# Patient Record
Sex: Female | Born: 1940
Health system: Southern US, Community
[De-identification: ages and names within clinical notes are randomized; demographics above are authoritative.]

## PROBLEM LIST (undated history)

## (undated) DIAGNOSIS — F329 Major depressive disorder, single episode, unspecified: Secondary | ICD-10-CM

## (undated) DIAGNOSIS — F419 Anxiety disorder, unspecified: Secondary | ICD-10-CM

## (undated) DIAGNOSIS — C4490 Unspecified malignant neoplasm of skin, unspecified: Secondary | ICD-10-CM

## (undated) DIAGNOSIS — H269 Unspecified cataract: Secondary | ICD-10-CM

## (undated) DIAGNOSIS — J329 Chronic sinusitis, unspecified: Secondary | ICD-10-CM

## (undated) DIAGNOSIS — R112 Nausea with vomiting, unspecified: Secondary | ICD-10-CM

## (undated) DIAGNOSIS — H3530 Unspecified macular degeneration: Secondary | ICD-10-CM

## (undated) DIAGNOSIS — G8929 Other chronic pain: Secondary | ICD-10-CM

## (undated) DIAGNOSIS — M549 Dorsalgia, unspecified: Secondary | ICD-10-CM

## (undated) DIAGNOSIS — C801 Malignant (primary) neoplasm, unspecified: Secondary | ICD-10-CM

## (undated) DIAGNOSIS — Z9889 Other specified postprocedural states: Secondary | ICD-10-CM

## (undated) DIAGNOSIS — R1901 Right upper quadrant abdominal swelling, mass and lump: Secondary | ICD-10-CM

## (undated) DIAGNOSIS — T7840XA Allergy, unspecified, initial encounter: Secondary | ICD-10-CM

## (undated) DIAGNOSIS — K219 Gastro-esophageal reflux disease without esophagitis: Secondary | ICD-10-CM

## (undated) DIAGNOSIS — N289 Disorder of kidney and ureter, unspecified: Secondary | ICD-10-CM

## (undated) DIAGNOSIS — H409 Unspecified glaucoma: Secondary | ICD-10-CM

## (undated) DIAGNOSIS — E039 Hypothyroidism, unspecified: Secondary | ICD-10-CM

## (undated) DIAGNOSIS — N189 Chronic kidney disease, unspecified: Secondary | ICD-10-CM

## (undated) DIAGNOSIS — I509 Heart failure, unspecified: Secondary | ICD-10-CM

## (undated) DIAGNOSIS — M1990 Unspecified osteoarthritis, unspecified site: Secondary | ICD-10-CM

## (undated) DIAGNOSIS — M797 Fibromyalgia: Secondary | ICD-10-CM

## (undated) DIAGNOSIS — F32A Depression, unspecified: Secondary | ICD-10-CM

## (undated) HISTORY — DX: Chronic sinusitis, unspecified: J32.9

## (undated) HISTORY — DX: Right upper quadrant abdominal swelling, mass and lump: R19.01

## (undated) HISTORY — PX: SMALL INTESTINE SURGERY: SHX150

## (undated) HISTORY — PX: EYE SURGERY: SHX253

## (undated) HISTORY — PX: ABDOMINAL HYSTERECTOMY: SUR658

## (undated) HISTORY — PX: APPENDECTOMY: SHX54

## (undated) HISTORY — PX: COSMETIC SURGERY: SHX468

## (undated) HISTORY — PX: BACK SURGERY: SHX140

## (undated) HISTORY — DX: Unspecified cataract: H26.9

## (undated) HISTORY — DX: Unspecified malignant neoplasm of skin, unspecified: C44.90

## (undated) HISTORY — PX: ABDOMINAL HYSTERECTOMY: SHX81

## (undated) HISTORY — DX: Unspecified osteoarthritis, unspecified site: M19.90

## (undated) HISTORY — PX: OTHER SURGICAL HISTORY: SHX169

## (undated) HISTORY — DX: Heart failure, unspecified: I50.9

## (undated) HISTORY — DX: Allergy, unspecified, initial encounter: T78.40XA

## (undated) HISTORY — PX: TONSILLECTOMY: SUR1361

## (undated) HISTORY — PX: CHOLECYSTECTOMY: SHX55

## (undated) HISTORY — DX: Chronic kidney disease, unspecified: N18.9

---

## 2003-07-16 ENCOUNTER — Ambulatory Visit (HOSPITAL_COMMUNITY): Admission: RE | Admit: 2003-07-16 | Discharge: 2003-07-16 | Payer: Self-pay | Admitting: Family Medicine

## 2003-07-16 ENCOUNTER — Encounter: Payer: Self-pay | Admitting: Family Medicine

## 2003-07-20 ENCOUNTER — Ambulatory Visit (HOSPITAL_COMMUNITY): Admission: RE | Admit: 2003-07-20 | Discharge: 2003-07-20 | Payer: Self-pay | Admitting: Family Medicine

## 2003-07-20 ENCOUNTER — Encounter: Payer: Self-pay | Admitting: Family Medicine

## 2006-12-31 ENCOUNTER — Ambulatory Visit (HOSPITAL_COMMUNITY): Admission: RE | Admit: 2006-12-31 | Discharge: 2006-12-31 | Payer: Self-pay | Admitting: Family Medicine

## 2008-01-02 ENCOUNTER — Ambulatory Visit (HOSPITAL_COMMUNITY): Admission: RE | Admit: 2008-01-02 | Discharge: 2008-01-02 | Payer: Self-pay | Admitting: Family Medicine

## 2008-11-02 ENCOUNTER — Encounter: Admission: RE | Admit: 2008-11-02 | Discharge: 2008-11-02 | Payer: Self-pay | Admitting: Neurosurgery

## 2008-12-17 ENCOUNTER — Inpatient Hospital Stay (HOSPITAL_COMMUNITY): Admission: RE | Admit: 2008-12-17 | Discharge: 2008-12-20 | Payer: Self-pay | Admitting: Neurosurgery

## 2009-05-03 ENCOUNTER — Ambulatory Visit (HOSPITAL_COMMUNITY): Admission: RE | Admit: 2009-05-03 | Discharge: 2009-05-03 | Payer: Self-pay | Admitting: Family Medicine

## 2009-05-06 ENCOUNTER — Encounter (HOSPITAL_COMMUNITY): Admission: RE | Admit: 2009-05-06 | Discharge: 2009-06-05 | Payer: Self-pay | Admitting: Family Medicine

## 2009-06-17 ENCOUNTER — Encounter (INDEPENDENT_AMBULATORY_CARE_PROVIDER_SITE_OTHER): Payer: Self-pay | Admitting: General Surgery

## 2009-06-17 ENCOUNTER — Ambulatory Visit (HOSPITAL_COMMUNITY): Admission: RE | Admit: 2009-06-17 | Discharge: 2009-06-17 | Payer: Self-pay | Admitting: General Surgery

## 2011-02-02 ENCOUNTER — Other Ambulatory Visit (HOSPITAL_COMMUNITY): Payer: Self-pay | Admitting: Family Medicine

## 2011-02-06 ENCOUNTER — Ambulatory Visit (HOSPITAL_COMMUNITY)
Admission: RE | Admit: 2011-02-06 | Discharge: 2011-02-06 | Disposition: A | Discharge status: Home / Self Care | Payer: Medicare Other | Source: Ambulatory Visit | Attending: Family Medicine | Admitting: Family Medicine

## 2011-02-06 ENCOUNTER — Other Ambulatory Visit (HOSPITAL_COMMUNITY): Payer: Self-pay | Admitting: Family Medicine

## 2011-02-06 DIAGNOSIS — R51 Headache: Secondary | ICD-10-CM | POA: Insufficient documentation

## 2011-02-06 DIAGNOSIS — J329 Chronic sinusitis, unspecified: Secondary | ICD-10-CM | POA: Insufficient documentation

## 2011-03-04 LAB — CBC
Hemoglobin: 12.1 g/dL (ref 12.0–15.0)
MCHC: 34.4 g/dL (ref 30.0–36.0)
RBC: 3.65 MIL/uL — ABNORMAL LOW (ref 3.87–5.11)
WBC: 5.6 10*3/uL (ref 4.0–10.5)

## 2011-03-04 LAB — COMPREHENSIVE METABOLIC PANEL
ALT: 14 U/L (ref 0–35)
AST: 20 U/L (ref 0–37)
Alkaline Phosphatase: 72 U/L (ref 39–117)
CO2: 29 mEq/L (ref 19–32)
Chloride: 103 mEq/L (ref 96–112)
GFR calc Af Amer: 52 mL/min — ABNORMAL LOW (ref >60)
GFR calc non Af Amer: 43 mL/min — ABNORMAL LOW (ref >60)
Glucose, Bld: 117 mg/dL — ABNORMAL HIGH (ref 70–99)
Potassium: 4.4 mEq/L (ref 3.5–5.1)
Sodium: 138 mEq/L (ref 135–145)
Total Bilirubin: 0.5 mg/dL (ref 0.3–1.2)

## 2011-03-04 LAB — DIFFERENTIAL
Basophils Relative: 0 % (ref 0–1)
Eosinophils Absolute: 0.1 10*3/uL (ref 0.0–0.7)
Eosinophils Relative: 2 % (ref 0–5)
Neutrophils Relative %: 71 % (ref 43–77)

## 2011-03-12 LAB — BASIC METABOLIC PANEL
CO2: 29 mEq/L (ref 19–32)
Calcium: 9.2 mg/dL (ref 8.4–10.5)
Chloride: 103 mEq/L (ref 96–112)
Creatinine, Ser: 1.19 mg/dL (ref 0.4–1.2)
GFR calc Af Amer: 55 mL/min — ABNORMAL LOW (ref >60)
Glucose, Bld: 103 mg/dL — ABNORMAL HIGH (ref 70–99)

## 2011-03-12 LAB — TYPE AND SCREEN
ABO/RH(D): O POS
Antibody Screen: NEGATIVE

## 2011-03-12 LAB — CBC
Hemoglobin: 12.1 g/dL (ref 12.0–15.0)
MCHC: 33.2 g/dL (ref 30.0–36.0)
MCV: 98.9 fL (ref 78.0–100.0)
RBC: 3.69 MIL/uL — ABNORMAL LOW (ref 3.87–5.11)
RDW: 13.2 % (ref 11.5–15.5)

## 2011-04-10 NOTE — Op Note (Signed)
NAMESATYA, BOHALL                ACCOUNT NO.:  [id number]   MEDICAL RECORD NO.:  [id number]          PATIENT TYPE:  AMB   LOCATION:  SDS                          FACILITY:  [facility]   PHYSICIAN:  Ollen Gross. Vernell Morgans, M.D. DATE OF BIRTH:  1941-01-08   DATE OF PROCEDURE:  06/17/2009  DATE OF DISCHARGE:  06/17/2009                               OPERATIVE REPORT   PREOPERATIVE DIAGNOSIS:  Biliary dyskinesia.   POSTOPERATIVE DIAGNOSIS:  Biliary dyskinesia.   PROCEDURE:  Laparoscopic cholecystectomy with intraoperative  cholangiogram.   SURGEON:  Ollen Gross. Vernell Morgans, MD   ANESTHESIA:  General endotracheal.   PROCEDURE:  After informed consent was obtained, the patient was brought  to the operating room, placed in the supine position on the operating  room table.  After adequate induction of general anesthesia, the  patient's abdomen was prepped with Betadine, draped in the usual sterile  manner.  The area below the umbilicus was infiltrated with 0.25%  Marcaine.  A small incision was made with a 15-blade knife.  This  incision was carried down through the subcutaneous tissue bluntly with a  hemostat and [employer] retractors until the linea alba was identified.  The linea alba was incised with a 15-blade knife and each side was  grasped Kocher clamps and elevated anteriorly.  The preperitoneal space  was then probed bluntly with a hemostat until the peritoneum was opened  and access was gained to the abdominal cavity.  A 0 Vicryl pursestring  stitch was placed in the fascia around the opening.  A Hasson cannula  was placed through the opening and anchored in place with the previously  placed Vicryl pursestring stitch.  The abdomen was then insufflated with  carbon dioxide without difficulty.  The patient was placed in reverse  Trendelenburg position and rotated with the right side up.  A  laparoscope was inserted through the Hasson cannula, and the right upper  quadrant was inspected.  The  dome of gallbladder and liver readily  identified.  Next, the epigastric region was infiltrated with 0.25%  Marcaine.  A small incision was made with a 15-blade knife.  A 10-mm  port was placed bluntly through this incision into the abdominal cavity  under direct vision.  Sites were chosen laterally on the right side  abdomen, placement of 5-mm port.  Each of these areas were infiltrated  with 0.25% Marcaine.  Small stab incisions were made with a 15-blade  knife and 5-mm ports were placed bluntly through these incisions into  the abdominal cavity under direct vision.  Blunt grasper was then placed  through the lateral-most 5-mm port and used to grasp the dome of the  gallbladder and elevate anteriorly and superiorly.  Another blunt  grasper was placed in the other 5-mm port and used to grasp the body and  used to retract on the body and neck of the gallbladder.  A dissector  was placed through the epigastric port, and using electrocautery the  peritoneal reflection at the gallbladder neck was opened.  Blunt  dissection was then carried out in this area  until the gallbladder neck-  cystic duct junction was readily identified and a good window was  created.  A single clip was placed on the gallbladder neck.  A small  ductotomy was made just below the clip with a laparoscopic scissors.  A  14-gauge Angiocath was placed percutaneously through the anterior  abdominal wall under direct vision.  A Reddick cholangiogram catheter  was placed through the catheter and flushed.  The Reddick catheter was  then placed through the cystic duct and anchored in place with a clip.  A cholangiogram was obtained that showed no filling defects, good  emptying in the duodenum, and adequate length on the cystic duct.  Anchoring clip and catheters were removed from the patient.  Three clips  were placed proximally on the cystic duct and duct was divided between  the 2 sets of clips.  Posterior to this, the cystic  artery was  identified and again dissected bluntly in a circumferential manner until  a good window was created.  Two clips were placed proximally and one  distally on the artery and artery was divided between the two.  Next, a  laparoscopic hook cautery device was used to separate the gallbladder  from the liver bed.  Prior to completely detaching the gallbladder from  the liver bed, the liver bed was inspected and found to be hemostatic.  The gallbladder was then detached and arrest away from liver bed without  difficulty with the hook cautery.  A laparoscopic bag was inserted  through the epigastric port.  Gallbladder was placed in the bag and bag  was sealed.  The abdomen was then irrigated copious amounts of saline  until the effluent was clear.  The laparoscope was then removed from the  epigastric port.  A gallbladder grasper was placed through the Hasson  cannula and used to grasp the opening of the bag.  The bag with the  gallbladder was removed through the infraumbilical port without  difficulty.  The fascial defect was closed with a Vicryl pursestring  stitch as well as with another figure-of-eight 0 Vicryl stitch.  The  rest of the ports were then removed under direct vision and were found  to be hemostatic.  The gas was allowed to escape.  Skin incisions were  all closed with interrupted 4-0 Monocryl subcuticular stitches.  Dermabond dressings were applied.  The patient tolerated the procedure  well.  At the end of the case, all needle, sponge, instrument counts  were correct.  The patient was awakened and taken to recovery in stable  condition.      Ollen Gross. Vernell Morgans, M.D.  Electronically Signed     PST/MEDQ  D:  06/17/2009  T:  06/18/2009  Job:  [id number]

## 2011-04-10 NOTE — Op Note (Signed)
[name], Yang                ACCOUNT NO.:  [id number]   MEDICAL RECORD NO.:  [id number]          PATIENT TYPE:  INP   LOCATION:  3012                         FACILITY:  [facility]   PHYSICIAN:  Danae Orleans. Venetia Maxon, M.D.  DATE OF BIRTH:  20-Nov-1941   DATE OF PROCEDURE:  12/17/2008  DATE OF DISCHARGE:                               OPERATIVE REPORT   PREOPERATIVE DIAGNOSIS:  L2-3 scoliosis, spondylosis, degenerative disk  disease, and radiculopathy.   POSTOPERATIVE DIAGNOSIS:  L2-3 scoliosis, spondylosis, degenerative disk  disease, and radiculopathy.   PROCEDURE:  L2-3 anterolateral decompression and fusion with anterior  lumbar interbody cage and lateral spinal plate with EMG monitoring.   SURGEON:  Danae Orleans. Venetia Maxon, MD   ASSISTANT:  1. Georgiann Cocker, RN  2. Hewitt Shorts, MD.   ANESTHESIA:  General endotracheal anesthesia.   ESTIMATED BLOOD LOSS:  Minimal.   COMPLICATIONS:  None.   DISPOSITION:  Recovery.   INDICATIONS:  [name] is a 70 year old woman with excruciating back  and left leg pain.  She has focal scoliosis at L2-3 with severe left-  sided nerve root entrapment affecting the L3 nerve root.  She had  injection therapy which gave her unsustained relief, but did help  relieve her pain with L3 nerve block.  It was therefore elected to take  her to surgery for anterolateral fusion with interbody cage and lateral  plate.   PROCEDURE:  [name] was brought to the operating room.  After smooth  and uncomplicated induction of general endotracheal anesthesia, the  patient was placed in the lateral position on the left side down.  Positioning her spine was confirmed on AP and lateral fluoroscopy, and  her iliac crest and chest were then taped along with her legs which were  flexed.  The table was then flexed while monitoring positioning within  the interspace at the L2-3 level.  A planned incision was then marked  and then wound was prepped and draped in the  usual sterile fashion.  Approximately 3-cm long lateral incision just directly over the L2-3  interspace was made and carried to the fascia.  A separate incision was  made 1 finger length away more posteriorly toward the spine on the line  with the previous incision.  The incision was carried to the fascia and  using Kelly with blunt dissection, the retroperitoneal space was entered  and dissection was made.  The spinal transverse process was palpated as  was the psoas.  Then, using finger, the inferior aspect of the incision  was palpated and incision was carried to meet the finger with the upper  incision.  Using sequential dilators and the neuronavigation, the  initial dilator was placed.  This was positioned overlying the  interspace just slightly posterior of the midline and a K-wire was  inserted within the disk.  Sequential dilation was performed all the  time with neural monitoring and there was no evidence of any nerve root  irritability.  There was no evidence of any nerve root contact at all  and the patient had a training for stimulation  test.  Subsequently, the  Maxis retractor was placed and its positioning again was confirmed on AP  and lateral fluoroscopy. After the shim was placed, the psoas muscle was  cleared and the interspace was incised.  A thorough diskectomy was  performed using Cobb dissectors and a variety of pituitary rongeurs and  a distraction device to open the interspace.  Positioning of these  devices was come confirmed on AP and lateral fluoroscopy.  The Cobb  dissector was taken through the annulus on the opposite side and after  trial sizing an 8-mm x 50-mm cage which was packed with Osteocel was  then inserted in the interspace and countersunk appropriately.  Subsequently, a lateral plate was then placed with a 50-mm x 5.5-mm  screw at L3 and a 45 x 5.5 mm screw at L2.  After using the initial  sizer and then subsequently a plate was placed and locking  caps were  torqued into position.  Final radiographs demonstrated well-positioned  interbody graft and screws and plate.  Wound was irrigated and self-  retaining tractors were removed.  The fascia was closed with 2-0 Vicryl  sutures.  The skin edges were approximated with 3-0 Vicryl interrupted  inverted sutures and the skin was dressed with Dermabond.  The table was  deflexed.  The patient was transferred to the OR gurney, extubated in  the operating room and taken to the recovery in stable satisfactory  condition having tolerated operation well.  Counts were correct at the  end of the case.      Danae Orleans. Venetia Maxon, M.D.  Electronically Signed     JDS/MEDQ  D:  12/17/2008  T:  12/18/2008  Job:  [id number]

## 2011-04-13 NOTE — Discharge Summary (Signed)
NAMECHANTILLY, [name]                ACCOUNT NO.:  [id number]   MEDICAL RECORD NO.:  [id number]          PATIENT TYPE:  INP   LOCATION:  3012                         FACILITY:  [facility]   PHYSICIAN:  Danae Orleans. Venetia Maxon, M.D.  DATE OF BIRTH:  1941-11-16   DATE OF ADMISSION:  12/17/2008  DATE OF DISCHARGE:  12/20/2008                               DISCHARGE SUMMARY   REASON FOR ADMISSION:  1. Idiopathic scoliosis.  2. Lumbosacral spondylosis.  3. Disk degeneration.  4. Myalgia and myositis.  5. Esophageal reflux.  6. Depressive disorder, no essential change.   FINAL DIAGNOSES:  1. Idiopathic scoliosis.  2. Lumbosacral spondylosis.  3. Disk degeneration.  4. Myalgia and myositis.  5. Esophageal reflux.  6. Depressive disorder, no essential change.   HISTORY OF PRESENT ILLNESS AND HOSPITAL COURSE:  [name] is a 70-  year-old woman with significant scoliosis centered at the L2-3 level  with degenerative disk disease and lumbar radiculopathy.  She underwent  anterolateral fusion, decompression and fusion at L2-3 with restoration  of her scoliosis and improvement in back and lower extremity pain.  She  was gradually mobilized, was doing well on December 20, 2008, and was  discharged home with instructions to follow up in the office in 3 weeks  with discharge medications of Percocet, Valium, Robaxin alternating with  Valium for muscle relaxer with the final diagnoses same and discharge  status improved.      Danae Orleans. Venetia Maxon, M.D.  Electronically Signed     Danae Orleans. Venetia Maxon, M.D.  Electronically Signed    JDS/MEDQ  D:  03/03/2009  T:  03/04/2009  Job:  [id number]

## 2011-11-30 DIAGNOSIS — H8110 Benign paroxysmal vertigo, unspecified ear: Secondary | ICD-10-CM | POA: Diagnosis not present

## 2011-11-30 DIAGNOSIS — H9190 Unspecified hearing loss, unspecified ear: Secondary | ICD-10-CM | POA: Diagnosis not present

## 2012-01-02 DIAGNOSIS — S335XXA Sprain of ligaments of lumbar spine, initial encounter: Secondary | ICD-10-CM | POA: Diagnosis not present

## 2012-01-29 DIAGNOSIS — IMO0002 Reserved for concepts with insufficient information to code with codable children: Secondary | ICD-10-CM | POA: Diagnosis not present

## 2012-02-14 DIAGNOSIS — L570 Actinic keratosis: Secondary | ICD-10-CM | POA: Diagnosis not present

## 2012-02-14 DIAGNOSIS — Z85828 Personal history of other malignant neoplasm of skin: Secondary | ICD-10-CM | POA: Diagnosis not present

## 2012-02-14 DIAGNOSIS — D235 Other benign neoplasm of skin of trunk: Secondary | ICD-10-CM | POA: Diagnosis not present

## 2012-07-31 DIAGNOSIS — Z23 Encounter for immunization: Secondary | ICD-10-CM | POA: Diagnosis not present

## 2012-08-11 DIAGNOSIS — IMO0002 Reserved for concepts with insufficient information to code with codable children: Secondary | ICD-10-CM | POA: Diagnosis not present

## 2012-08-11 DIAGNOSIS — R5381 Other malaise: Secondary | ICD-10-CM | POA: Diagnosis not present

## 2012-08-11 DIAGNOSIS — G4700 Insomnia, unspecified: Secondary | ICD-10-CM | POA: Diagnosis not present

## 2012-10-28 DIAGNOSIS — H2510 Age-related nuclear cataract, unspecified eye: Secondary | ICD-10-CM | POA: Diagnosis not present

## 2012-10-28 DIAGNOSIS — H524 Presbyopia: Secondary | ICD-10-CM | POA: Diagnosis not present

## 2012-10-28 DIAGNOSIS — H353190 Nonexudative age-related macular degeneration, unspecified eye, stage unspecified: Secondary | ICD-10-CM | POA: Diagnosis not present

## 2012-10-28 DIAGNOSIS — H35369 Drusen (degenerative) of macula, unspecified eye: Secondary | ICD-10-CM | POA: Diagnosis not present

## 2012-11-28 DIAGNOSIS — H35369 Drusen (degenerative) of macula, unspecified eye: Secondary | ICD-10-CM | POA: Diagnosis not present

## 2012-11-28 DIAGNOSIS — H353190 Nonexudative age-related macular degeneration, unspecified eye, stage unspecified: Secondary | ICD-10-CM | POA: Diagnosis not present

## 2013-01-15 DIAGNOSIS — J9801 Acute bronchospasm: Secondary | ICD-10-CM | POA: Diagnosis not present

## 2013-01-15 DIAGNOSIS — E039 Hypothyroidism, unspecified: Secondary | ICD-10-CM | POA: Diagnosis not present

## 2013-01-15 DIAGNOSIS — J209 Acute bronchitis, unspecified: Secondary | ICD-10-CM | POA: Diagnosis not present

## 2013-01-15 DIAGNOSIS — IMO0002 Reserved for concepts with insufficient information to code with codable children: Secondary | ICD-10-CM | POA: Diagnosis not present

## 2013-02-01 DIAGNOSIS — H5310 Unspecified subjective visual disturbances: Secondary | ICD-10-CM | POA: Diagnosis not present

## 2013-03-23 ENCOUNTER — Other Ambulatory Visit (HOSPITAL_COMMUNITY): Payer: Self-pay | Admitting: Family Medicine

## 2013-03-23 DIAGNOSIS — G4700 Insomnia, unspecified: Secondary | ICD-10-CM | POA: Diagnosis not present

## 2013-03-23 DIAGNOSIS — E039 Hypothyroidism, unspecified: Secondary | ICD-10-CM | POA: Diagnosis not present

## 2013-03-23 DIAGNOSIS — Z139 Encounter for screening, unspecified: Secondary | ICD-10-CM

## 2013-04-01 ENCOUNTER — Other Ambulatory Visit (HOSPITAL_COMMUNITY): Payer: 59

## 2013-05-04 DIAGNOSIS — H353190 Nonexudative age-related macular degeneration, unspecified eye, stage unspecified: Secondary | ICD-10-CM | POA: Diagnosis not present

## 2013-05-04 DIAGNOSIS — H2510 Age-related nuclear cataract, unspecified eye: Secondary | ICD-10-CM | POA: Diagnosis not present

## 2013-05-04 DIAGNOSIS — H35369 Drusen (degenerative) of macula, unspecified eye: Secondary | ICD-10-CM | POA: Diagnosis not present

## 2013-07-22 DIAGNOSIS — M1990 Unspecified osteoarthritis, unspecified site: Secondary | ICD-10-CM | POA: Diagnosis not present

## 2013-07-22 DIAGNOSIS — E039 Hypothyroidism, unspecified: Secondary | ICD-10-CM | POA: Diagnosis not present

## 2013-07-22 DIAGNOSIS — G4700 Insomnia, unspecified: Secondary | ICD-10-CM | POA: Diagnosis not present

## 2013-08-31 DIAGNOSIS — Z23 Encounter for immunization: Secondary | ICD-10-CM | POA: Diagnosis not present

## 2013-10-12 DIAGNOSIS — L819 Disorder of pigmentation, unspecified: Secondary | ICD-10-CM | POA: Diagnosis not present

## 2013-10-12 DIAGNOSIS — L570 Actinic keratosis: Secondary | ICD-10-CM | POA: Diagnosis not present

## 2013-10-12 DIAGNOSIS — Z85828 Personal history of other malignant neoplasm of skin: Secondary | ICD-10-CM | POA: Diagnosis not present

## 2013-10-12 DIAGNOSIS — D485 Neoplasm of uncertain behavior of skin: Secondary | ICD-10-CM | POA: Diagnosis not present

## 2013-10-12 DIAGNOSIS — C44319 Basal cell carcinoma of skin of other parts of face: Secondary | ICD-10-CM | POA: Diagnosis not present

## 2013-10-29 DIAGNOSIS — C44319 Basal cell carcinoma of skin of other parts of face: Secondary | ICD-10-CM | POA: Diagnosis not present

## 2013-10-29 DIAGNOSIS — L570 Actinic keratosis: Secondary | ICD-10-CM | POA: Diagnosis not present

## 2013-11-11 DIAGNOSIS — M545 Low back pain: Secondary | ICD-10-CM | POA: Diagnosis not present

## 2013-11-11 DIAGNOSIS — E039 Hypothyroidism, unspecified: Secondary | ICD-10-CM | POA: Diagnosis not present

## 2013-11-11 DIAGNOSIS — F411 Generalized anxiety disorder: Secondary | ICD-10-CM | POA: Diagnosis not present

## 2013-11-23 DIAGNOSIS — H353190 Nonexudative age-related macular degeneration, unspecified eye, stage unspecified: Secondary | ICD-10-CM | POA: Diagnosis not present

## 2013-11-23 DIAGNOSIS — H524 Presbyopia: Secondary | ICD-10-CM | POA: Diagnosis not present

## 2013-11-23 DIAGNOSIS — H2510 Age-related nuclear cataract, unspecified eye: Secondary | ICD-10-CM | POA: Diagnosis not present

## 2013-11-23 DIAGNOSIS — H52229 Regular astigmatism, unspecified eye: Secondary | ICD-10-CM | POA: Diagnosis not present

## 2013-12-07 DIAGNOSIS — L570 Actinic keratosis: Secondary | ICD-10-CM | POA: Diagnosis not present

## 2014-03-02 DIAGNOSIS — K2100 Gastro-esophageal reflux disease with esophagitis, without bleeding: Secondary | ICD-10-CM | POA: Diagnosis not present

## 2014-03-02 DIAGNOSIS — M545 Low back pain, unspecified: Secondary | ICD-10-CM | POA: Diagnosis not present

## 2014-03-02 DIAGNOSIS — E039 Hypothyroidism, unspecified: Secondary | ICD-10-CM | POA: Diagnosis not present

## 2014-03-02 DIAGNOSIS — F411 Generalized anxiety disorder: Secondary | ICD-10-CM | POA: Diagnosis not present

## 2014-03-11 DIAGNOSIS — R5381 Other malaise: Secondary | ICD-10-CM | POA: Diagnosis not present

## 2014-03-11 DIAGNOSIS — R5383 Other fatigue: Secondary | ICD-10-CM | POA: Diagnosis not present

## 2014-03-11 DIAGNOSIS — E039 Hypothyroidism, unspecified: Secondary | ICD-10-CM | POA: Diagnosis not present

## 2014-03-11 DIAGNOSIS — M545 Low back pain, unspecified: Secondary | ICD-10-CM | POA: Diagnosis not present

## 2014-03-11 DIAGNOSIS — K2100 Gastro-esophageal reflux disease with esophagitis, without bleeding: Secondary | ICD-10-CM | POA: Diagnosis not present

## 2014-03-22 ENCOUNTER — Encounter (INDEPENDENT_AMBULATORY_CARE_PROVIDER_SITE_OTHER): Payer: Self-pay

## 2014-03-22 ENCOUNTER — Encounter (INDEPENDENT_AMBULATORY_CARE_PROVIDER_SITE_OTHER): Payer: Self-pay | Admitting: *Deleted

## 2014-05-05 DIAGNOSIS — Z79899 Other long term (current) drug therapy: Secondary | ICD-10-CM | POA: Diagnosis not present

## 2014-05-05 DIAGNOSIS — M545 Low back pain, unspecified: Secondary | ICD-10-CM | POA: Diagnosis not present

## 2014-05-05 DIAGNOSIS — K2100 Gastro-esophageal reflux disease with esophagitis, without bleeding: Secondary | ICD-10-CM | POA: Diagnosis not present

## 2014-05-05 DIAGNOSIS — E039 Hypothyroidism, unspecified: Secondary | ICD-10-CM | POA: Diagnosis not present

## 2014-05-05 DIAGNOSIS — Z5181 Encounter for therapeutic drug level monitoring: Secondary | ICD-10-CM | POA: Diagnosis not present

## 2014-06-07 DIAGNOSIS — D485 Neoplasm of uncertain behavior of skin: Secondary | ICD-10-CM | POA: Diagnosis not present

## 2014-06-07 DIAGNOSIS — Z85828 Personal history of other malignant neoplasm of skin: Secondary | ICD-10-CM | POA: Diagnosis not present

## 2014-06-07 DIAGNOSIS — L570 Actinic keratosis: Secondary | ICD-10-CM | POA: Diagnosis not present

## 2014-07-13 ENCOUNTER — Other Ambulatory Visit (HOSPITAL_COMMUNITY): Payer: Self-pay | Admitting: Pulmonary Disease

## 2014-07-13 DIAGNOSIS — E039 Hypothyroidism, unspecified: Secondary | ICD-10-CM | POA: Diagnosis not present

## 2014-07-13 DIAGNOSIS — F411 Generalized anxiety disorder: Secondary | ICD-10-CM | POA: Diagnosis not present

## 2014-07-13 DIAGNOSIS — K2100 Gastro-esophageal reflux disease with esophagitis, without bleeding: Secondary | ICD-10-CM | POA: Diagnosis not present

## 2014-07-13 DIAGNOSIS — M545 Low back pain, unspecified: Secondary | ICD-10-CM | POA: Diagnosis not present

## 2014-07-13 DIAGNOSIS — M549 Dorsalgia, unspecified: Secondary | ICD-10-CM

## 2014-07-19 ENCOUNTER — Ambulatory Visit (HOSPITAL_COMMUNITY)
Admission: RE | Admit: 2014-07-19 | Discharge: 2014-07-19 | Disposition: A | Discharge status: Home / Self Care | Payer: Medicare Other | Source: Ambulatory Visit | Attending: Pulmonary Disease | Admitting: Pulmonary Disease

## 2014-07-19 DIAGNOSIS — M549 Dorsalgia, unspecified: Secondary | ICD-10-CM

## 2014-07-19 DIAGNOSIS — M545 Low back pain, unspecified: Secondary | ICD-10-CM | POA: Diagnosis present

## 2014-07-19 DIAGNOSIS — M5126 Other intervertebral disc displacement, lumbar region: Secondary | ICD-10-CM | POA: Diagnosis not present

## 2014-07-19 DIAGNOSIS — M47817 Spondylosis without myelopathy or radiculopathy, lumbosacral region: Secondary | ICD-10-CM | POA: Diagnosis not present

## 2014-07-19 DIAGNOSIS — M129 Arthropathy, unspecified: Secondary | ICD-10-CM | POA: Diagnosis not present

## 2014-08-26 DIAGNOSIS — Z23 Encounter for immunization: Secondary | ICD-10-CM | POA: Diagnosis not present

## 2014-09-09 DIAGNOSIS — M47816 Spondylosis without myelopathy or radiculopathy, lumbar region: Secondary | ICD-10-CM | POA: Diagnosis not present

## 2014-09-09 DIAGNOSIS — M5416 Radiculopathy, lumbar region: Secondary | ICD-10-CM | POA: Diagnosis not present

## 2014-09-09 DIAGNOSIS — M5137 Other intervertebral disc degeneration, lumbosacral region: Secondary | ICD-10-CM | POA: Diagnosis not present

## 2014-09-28 DIAGNOSIS — M47816 Spondylosis without myelopathy or radiculopathy, lumbar region: Secondary | ICD-10-CM | POA: Diagnosis not present

## 2014-10-18 DIAGNOSIS — M47816 Spondylosis without myelopathy or radiculopathy, lumbar region: Secondary | ICD-10-CM | POA: Diagnosis not present

## 2014-10-18 DIAGNOSIS — M5137 Other intervertebral disc degeneration, lumbosacral region: Secondary | ICD-10-CM | POA: Diagnosis not present

## 2014-10-18 DIAGNOSIS — M5416 Radiculopathy, lumbar region: Secondary | ICD-10-CM | POA: Diagnosis not present

## 2014-10-23 DIAGNOSIS — R0602 Shortness of breath: Secondary | ICD-10-CM | POA: Diagnosis not present

## 2014-10-23 DIAGNOSIS — J40 Bronchitis, not specified as acute or chronic: Secondary | ICD-10-CM | POA: Diagnosis not present

## 2014-10-23 DIAGNOSIS — Z87891 Personal history of nicotine dependence: Secondary | ICD-10-CM | POA: Diagnosis not present

## 2014-10-23 DIAGNOSIS — R079 Chest pain, unspecified: Secondary | ICD-10-CM | POA: Diagnosis not present

## 2014-10-23 DIAGNOSIS — R05 Cough: Secondary | ICD-10-CM | POA: Diagnosis not present

## 2014-10-26 DIAGNOSIS — F209 Schizophrenia, unspecified: Secondary | ICD-10-CM | POA: Diagnosis not present

## 2014-11-11 ENCOUNTER — Ambulatory Visit (HOSPITAL_COMMUNITY)
Admission: RE | Admit: 2014-11-11 | Discharge: 2014-11-11 | Disposition: A | Discharge status: Home / Self Care | Payer: 59 | Source: Ambulatory Visit | Attending: Pulmonary Disease | Admitting: Pulmonary Disease

## 2014-11-11 ENCOUNTER — Other Ambulatory Visit (HOSPITAL_COMMUNITY): Payer: Self-pay | Admitting: Pulmonary Disease

## 2014-11-11 DIAGNOSIS — M4184 Other forms of scoliosis, thoracic region: Secondary | ICD-10-CM | POA: Insufficient documentation

## 2014-11-11 DIAGNOSIS — J40 Bronchitis, not specified as acute or chronic: Secondary | ICD-10-CM | POA: Diagnosis not present

## 2014-11-11 DIAGNOSIS — J209 Acute bronchitis, unspecified: Secondary | ICD-10-CM | POA: Diagnosis not present

## 2014-11-11 DIAGNOSIS — M545 Low back pain: Secondary | ICD-10-CM | POA: Diagnosis not present

## 2014-11-22 DIAGNOSIS — M5137 Other intervertebral disc degeneration, lumbosacral region: Secondary | ICD-10-CM | POA: Diagnosis not present

## 2014-11-22 DIAGNOSIS — M5416 Radiculopathy, lumbar region: Secondary | ICD-10-CM | POA: Diagnosis not present

## 2014-11-22 DIAGNOSIS — M47816 Spondylosis without myelopathy or radiculopathy, lumbar region: Secondary | ICD-10-CM | POA: Diagnosis not present

## 2014-12-08 ENCOUNTER — Other Ambulatory Visit (HOSPITAL_COMMUNITY): Payer: Self-pay | Admitting: Respiratory Therapy

## 2014-12-08 DIAGNOSIS — R059 Cough, unspecified: Secondary | ICD-10-CM

## 2014-12-08 DIAGNOSIS — R05 Cough: Secondary | ICD-10-CM

## 2014-12-10 ENCOUNTER — Ambulatory Visit (HOSPITAL_COMMUNITY)
Admission: RE | Admit: 2014-12-10 | Discharge: 2014-12-10 | Disposition: A | Discharge status: Home / Self Care | Payer: Medicare Other | Source: Ambulatory Visit | Attending: Pulmonary Disease | Admitting: Pulmonary Disease

## 2014-12-10 DIAGNOSIS — R05 Cough: Secondary | ICD-10-CM | POA: Diagnosis not present

## 2014-12-10 MED ORDER — ALBUTEROL SULFATE (2.5 MG/3ML) 0.083% IN NEBU
2.5000 mg | INHALATION_SOLUTION | Freq: Once | RESPIRATORY_TRACT | Status: AC
  Administered 2014-12-10: 2.5 mg via RESPIRATORY_TRACT

## 2014-12-12 LAB — PULMONARY FUNCTION TEST
DL/VA % pred: 84 %
DL/VA: 4.06 ml/min/mmHg/L
DLCO COR % PRED: 63 %
DLCO COR: 15.46 ml/min/mmHg
DLCO unc % pred: 63 %
DLCO unc: 15.46 ml/min/mmHg
FEF 25-75 Post: 2.76 L/sec
FEF 25-75 Pre: 1.35 L/sec
FEF2575-%Change-Post: 103 %
FEF2575-%PRED-POST: 149 %
FEF2575-%Pred-Pre: 73 %
FEV1-%Change-Post: 11 %
FEV1-%PRED-POST: 82 %
FEV1-%PRED-PRE: 73 %
FEV1-POST: 1.83 L
FEV1-PRE: 1.64 L
FEV1FVC-%Change-Post: 13 %
FEV1FVC-%Pred-Pre: 102 %
FEV6-%Change-Post: -1 %
FEV6-%Pred-Post: 73 %
FEV6-%Pred-Pre: 74 %
FEV6-POST: 2.06 L
FEV6-Pre: 2.1 L
FEV6FVC-%PRED-POST: 104 %
FEV6FVC-%PRED-PRE: 104 %
FVC-%Change-Post: -1 %
FVC-%Pred-Post: 69 %
FVC-%Pred-Pre: 71 %
FVC-PRE: 2.1 L
FVC-Post: 2.06 L
POST FEV1/FVC RATIO: 89 %
PRE FEV1/FVC RATIO: 78 %
Post FEV6/FVC ratio: 100 %
Pre FEV6/FVC Ratio: 100 %
RV % PRED: 107 %
RV: 2.39 L
TLC % PRED: 87 %
TLC: 4.43 L

## 2014-12-13 ENCOUNTER — Other Ambulatory Visit (HOSPITAL_COMMUNITY): Payer: Self-pay | Admitting: Pulmonary Disease

## 2014-12-13 DIAGNOSIS — J209 Acute bronchitis, unspecified: Secondary | ICD-10-CM

## 2014-12-15 ENCOUNTER — Encounter (HOSPITAL_COMMUNITY): Payer: Self-pay

## 2014-12-15 ENCOUNTER — Ambulatory Visit (HOSPITAL_COMMUNITY)
Admission: RE | Admit: 2014-12-15 | Discharge: 2014-12-15 | Disposition: A | Discharge status: Home / Self Care | Payer: Medicare Other | Source: Ambulatory Visit | Attending: Pulmonary Disease | Admitting: Pulmonary Disease

## 2014-12-15 DIAGNOSIS — R05 Cough: Secondary | ICD-10-CM | POA: Diagnosis not present

## 2014-12-15 DIAGNOSIS — Z79899 Other long term (current) drug therapy: Secondary | ICD-10-CM | POA: Diagnosis not present

## 2014-12-15 DIAGNOSIS — R634 Abnormal weight loss: Secondary | ICD-10-CM | POA: Insufficient documentation

## 2014-12-15 DIAGNOSIS — J984 Other disorders of lung: Secondary | ICD-10-CM | POA: Diagnosis not present

## 2014-12-15 DIAGNOSIS — R630 Anorexia: Secondary | ICD-10-CM | POA: Diagnosis not present

## 2014-12-15 DIAGNOSIS — J209 Acute bronchitis, unspecified: Secondary | ICD-10-CM

## 2014-12-15 DIAGNOSIS — E039 Hypothyroidism, unspecified: Secondary | ICD-10-CM | POA: Diagnosis not present

## 2014-12-15 DIAGNOSIS — H3530 Unspecified macular degeneration: Secondary | ICD-10-CM | POA: Diagnosis not present

## 2014-12-15 LAB — POCT I-STAT CREATININE: Creatinine, Ser: 1.2 mg/dL — ABNORMAL HIGH (ref 0.50–1.10)

## 2014-12-15 MED ORDER — IOHEXOL 300 MG/ML  SOLN
80.0000 mL | Freq: Once | INTRAMUSCULAR | Status: AC | PRN
  Administered 2014-12-15: 80 mL via INTRAVENOUS

## 2015-02-03 ENCOUNTER — Ambulatory Visit (INDEPENDENT_AMBULATORY_CARE_PROVIDER_SITE_OTHER): Payer: Medicare Other | Admitting: Internal Medicine

## 2015-02-03 ENCOUNTER — Other Ambulatory Visit (INDEPENDENT_AMBULATORY_CARE_PROVIDER_SITE_OTHER): Payer: Medicare Other

## 2015-02-03 ENCOUNTER — Encounter: Payer: Self-pay | Admitting: Internal Medicine

## 2015-02-03 VITALS — BP 112/78 | HR 87 | Ht 64.0 in | Wt 141.0 lb

## 2015-02-03 DIAGNOSIS — R059 Cough, unspecified: Secondary | ICD-10-CM

## 2015-02-03 DIAGNOSIS — R05 Cough: Secondary | ICD-10-CM

## 2015-02-03 LAB — CBC WITH DIFFERENTIAL/PLATELET
BASOS ABS: 0 10*3/uL (ref 0.0–0.1)
BASOS PCT: 0.5 % (ref 0.0–3.0)
EOS ABS: 0.1 10*3/uL (ref 0.0–0.7)
Eosinophils Relative: 1.1 % (ref 0.0–5.0)
HCT: 38 % (ref 36.0–46.0)
Hemoglobin: 12.9 g/dL (ref 12.0–15.0)
LYMPHS ABS: 1.1 10*3/uL (ref 0.7–4.0)
Lymphocytes Relative: 14.3 % (ref 12.0–46.0)
MCHC: 33.8 g/dL (ref 30.0–36.0)
MCV: 94.7 fl (ref 78.0–100.0)
MONO ABS: 0.9 10*3/uL (ref 0.1–1.0)
Monocytes Relative: 11.5 % (ref 3.0–12.0)
Neutro Abs: 5.6 10*3/uL (ref 1.4–7.7)
Neutrophils Relative %: 72.6 % (ref 43.0–77.0)
Platelets: 342 10*3/uL (ref 150.0–400.0)
RBC: 4.01 Mil/uL (ref 3.87–5.11)
RDW: 13.1 % (ref 11.5–15.5)
WBC: 7.7 10*3/uL (ref 4.0–10.5)

## 2015-02-03 MED ORDER — MOMETASONE FURO-FORMOTEROL FUM 100-5 MCG/ACT IN AERO: INHALATION_SPRAY | RESPIRATORY_TRACT | Status: DC

## 2015-02-03 MED ORDER — FAMOTIDINE 20 MG PO TABS: ORAL_TABLET | ORAL | Status: DC

## 2015-02-03 MED ORDER — TRAMADOL HCL 50 MG PO TABS: ORAL_TABLET | ORAL | Status: DC

## 2015-02-03 NOTE — Patient Instructions (Addendum)
Please see patient coordinator before you leave today  to schedule sinus ct   Please remember to go to the lab  department downstairs for your tests - we will call you with the results when they are available.  Dulera 100 Take 2 puffs first thing in am and then another 2 puffs about 12 hours later.   Only use your albuterol as a rescue medication to be used if you can't catch your breath by resting or doing a relaxed purse lip breathing pattern.  - The less you use it, the better it will work when you need it. - Ok to use up to every 4 hours only if you have to   Take delsym two tsp every 12 hours and supplement if needed with  tramadol 50 mg up to 2 every 4 hours to suppress the urge to cough. Swallowing water or using ice chips/non mint and menthol containing candies (such as lifesavers or sugarless jolly ranchers) are also effective.  You should rest your voice and avoid activities that you know make you cough.  Once you have eliminated the cough for 3 straight days try reducing the tramadol first,  then the delsym as tolerated.    Add pepcid ac 20 mg at bedtime   GERD (REFLUX)  is an extremely common cause of respiratory symptoms just like yours , many times with no obvious heartburn at all.    It can be treated with medication, but also with lifestyle changes including avoidance of late meals, excessive alcohol, smoking cessation, and avoid fatty foods, chocolate, peppermint, colas, red wine, and acidic juices such as orange juice.  NO MINT OR MENTHOL PRODUCTS SO NO COUGH DROPS  USE SUGARLESS CANDY INSTEAD (Jolley ranchers or Stover's or Life Savers) or even ice chips will also do - the key is to swallow to prevent all throat clearing. NO OIL BASED VITAMINS - use powdered substitutes.    Please schedule a follow up office visit in 2 weeks, sooner if needed

## 2015-02-03 NOTE — Progress Notes (Signed)
   Subjective:    Patient ID: [name], female    DOB: 24-Sep-1941,    MRN: [id number]  HPI  62 yowf never smoker with chronic rhinitis which tends to be worse in spring onset around 2000 and worse severity  over the years despite taking zyrtec nightly then onset of first episode ever of persistent cough early November 2015 referred to pulmonary clinic 02/03/2015  by Dr Luan Pulling for refractory cough.   02/03/2015 1st [facility] office visit/ Wert   Chief Complaint  Patient presents with  . Pulmonary Consult    Referred by Dr. Velvet Bathe. Pt c/o cough and SOB since Nov 2015. She states that she gets SOB and tired with any exertion. She is coughing up minimal clear to cream colored sputum.  Cough is esp worse when she talks alot.   at onset in November 2015 cough was variably productive of dark  Brown mucus waxed and waned since onset  Cough worse when lies down each hs  Better p neb alb but then gets shaking  Multiple abx "only gave her diarrhea, did not help the cough"  Mostly sob when coughing.   No obvious other patterns in day to day or daytime variabilty or assoc  cp or chest tightness, subjective wheeze.   No unusual exp hx or h/o childhood pna/ asthma or knowledge of premature birth.  Sleeping ok without nocturnal  or early am exacerbation  of respiratory  c/o's or need for noct saba. Also denies any obvious fluctuation of symptoms with weather or environmental changes or other aggravating or alleviating factors except as outlined above   Current Medications, Allergies, Complete Past Medical History, Past Surgical History, Family History, and Social History were reviewed in [employer] record.            Review of Systems  Constitutional: Negative for fever, chills and unexpected weight change.  HENT: Negative for congestion, dental problem, ear pain, nosebleeds, postnasal drip, rhinorrhea, sinus pressure, sneezing, sore throat, trouble swallowing  and voice change.   Eyes: Negative for visual disturbance.  Respiratory: Positive for cough and shortness of breath. Negative for choking.   Cardiovascular: Negative for chest pain and leg swelling.  Gastrointestinal: Negative for vomiting, abdominal pain and diarrhea.  Genitourinary: Negative for difficulty urinating.       Acid heartburn Indigestion  Musculoskeletal: Negative for arthralgias.  Skin: Negative for rash.  Neurological: Negative for tremors, syncope and headaches.  Hematological: Does not bruise/bleed easily.       Objective:   Physical Exam  amb wf nad much less than stated age   Wt Readings from Last 3 Encounters:  02/03/15 141 lb (63.957 kg)    Vital signs reviewed   HEENT: nl dentition, turbinates, and orophanx. Nl external ear canals without cough reflex   NECK :  without JVD/Nodes/TM/ nl carotid upstrokes bilaterally   LUNGS: no acc muscle use, clear to A and P bilaterally without cough on insp or exp maneuvers   CV:  RRR  no s3 or murmur or increase in P2, no edema   ABD:  soft and nontender with nl excursion in the supine position. No bruits or organomegaly, bowel sounds nl  MS:  warm without deformities, calf tenderness, cyanosis or clubbing  SKIN: warm and dry without lesions    NEURO:  alert, approp, no deficits            Assessment & Plan:

## 2015-02-03 NOTE — Assessment & Plan Note (Signed)
The most common causes of chronic cough in immunocompetent adults include the following: upper airway cough syndrome (UACS), previously referred to as postnasal drip syndrome (PNDS), which is caused by variety of rhinosinus conditions; (2) asthma; (3) GERD; (4) chronic bronchitis from cigarette smoking or other inhaled environmental irritants; (5) nonasthmatic eosinophilic bronchitis; and (6) bronchiectasis.   These conditions, singly or in combination, have accounted for up to 94% of the causes of chronic cough in prospective studies.   Other conditions have constituted no >6% of the causes in prospective studies These have included bronchogenic carcinoma, chronic interstitial pneumonia, sarcoidosis, left ventricular failure, ACEI-induced cough, and aspiration from a condition associated with pharyngeal dysfunction.    Chronic cough is often simultaneously caused by more than one condition. A single cause has been found from 38 to 82% of the time, multiple causes from 18 to 62%. Multiply caused cough has been the result of three diseases up to 42% of the time.       ddx is between cough variant asthma or UACS related to underlying sinsusitis  Since reports the best response to date has been saba, rec trial of dulera 100 2bid while max rx for gerd and w/u for sinusitis   Discussed with pt:  The standardized cough guidelines published in Chest by Lissa Morales in 2006 are still the best available and consist of a multiple step process (up to 12!) , not a single office visit,  and are intended  to address this problem logically,  with an alogrithm dependent on response to empiric treatment at  each progressive step  to determine a specific diagnosis with  minimal addtional testing needed. Therefore if adherence is an issue or can't be accurately verified,  it's very unlikely the standard evaluation and treatment will be successful here.    Furthermore, response to therapy (other than acute cough  suppression, which should only be used short term with avoidance of narcotic containing cough syrups if possible), can be a gradual process for which the patient may perceive immediate benefit.  Unlike going to an eye doctor where the best perscription is almost always the first one and is immediately effective, this is almost never the case in the management of chronic cough syndromes. Therefore the patient needs to commit up front to consistently adhere to recommendations  for up to 6 weeks of therapy directed at the likely underlying problem(s) before the response can be reasonably evaluated.

## 2015-02-04 LAB — ALLERGY FULL PROFILE
Allergen, D pternoyssinus,d7: <0.1 kU/L
Allergen,Goose feathers, e70: <0.1 kU/L
Aspergillus fumigatus, m3: <0.1 kU/L
Bahia Grass: <0.1 kU/L
Bermuda Grass: <0.1 kU/L
Box Elder IgE: <0.1 kU/L
Common Ragweed: <0.1 kU/L
D. farinae: <0.1 kU/L
Elm IgE: <0.1 kU/L
G009 Red Top: <0.1 kU/L
House Dust Hollister: <0.1 kU/L
IGE (IMMUNOGLOBULIN E), SERUM: 2 kU/L (ref <115)
Lamb's Quarters: <0.1 kU/L
Oak: <0.1 kU/L
Plantain: <0.1 kU/L
Stemphylium Botryosum: <0.1 kU/L
Timothy Grass: <0.1 kU/L

## 2015-02-07 NOTE — Progress Notes (Signed)
Quick Note:  Spoke with pt and notified of results per Dr. Wert. Pt verbalized understanding and denied any questions.  ______ 

## 2015-02-08 ENCOUNTER — Other Ambulatory Visit (HOSPITAL_COMMUNITY): Payer: 59

## 2015-02-08 DIAGNOSIS — H2513 Age-related nuclear cataract, bilateral: Secondary | ICD-10-CM | POA: Diagnosis not present

## 2015-02-08 DIAGNOSIS — H5203 Hypermetropia, bilateral: Secondary | ICD-10-CM | POA: Diagnosis not present

## 2015-02-08 DIAGNOSIS — H3531 Nonexudative age-related macular degeneration: Secondary | ICD-10-CM | POA: Diagnosis not present

## 2015-02-08 DIAGNOSIS — H35363 Drusen (degenerative) of macula, bilateral: Secondary | ICD-10-CM | POA: Diagnosis not present

## 2015-02-10 ENCOUNTER — Ambulatory Visit (HOSPITAL_COMMUNITY)
Admission: RE | Admit: 2015-02-10 | Discharge: 2015-02-10 | Disposition: A | Discharge status: Home / Self Care | Payer: Medicare Other | Source: Ambulatory Visit | Attending: Diagnostic Radiology | Admitting: Diagnostic Radiology

## 2015-02-10 ENCOUNTER — Other Ambulatory Visit: Payer: Self-pay | Admitting: Internal Medicine

## 2015-02-10 DIAGNOSIS — R059 Cough, unspecified: Secondary | ICD-10-CM

## 2015-02-10 DIAGNOSIS — R05 Cough: Secondary | ICD-10-CM | POA: Diagnosis not present

## 2015-02-10 DIAGNOSIS — J320 Chronic maxillary sinusitis: Secondary | ICD-10-CM | POA: Diagnosis not present

## 2015-02-10 DIAGNOSIS — J323 Chronic sphenoidal sinusitis: Secondary | ICD-10-CM | POA: Diagnosis not present

## 2015-02-10 MED ORDER — AMOXICILLIN-POT CLAVULANATE 875-125 MG PO TABS: 1.0000 | ORAL_TABLET | Freq: Two times a day (BID) | ORAL | Status: DC

## 2015-02-10 NOTE — Progress Notes (Signed)
Quick Note:  Spoke with pt and notified of results per Dr. Wert. Pt verbalized understanding and denied any questions.  ______ 

## 2015-02-11 DIAGNOSIS — M47816 Spondylosis without myelopathy or radiculopathy, lumbar region: Secondary | ICD-10-CM | POA: Diagnosis not present

## 2015-02-11 DIAGNOSIS — Z981 Arthrodesis status: Secondary | ICD-10-CM | POA: Diagnosis not present

## 2015-02-15 DIAGNOSIS — M545 Low back pain: Secondary | ICD-10-CM | POA: Diagnosis not present

## 2015-02-15 DIAGNOSIS — M47816 Spondylosis without myelopathy or radiculopathy, lumbar region: Secondary | ICD-10-CM | POA: Diagnosis not present

## 2015-02-21 DIAGNOSIS — Z780 Asymptomatic menopausal state: Secondary | ICD-10-CM | POA: Diagnosis not present

## 2015-02-21 DIAGNOSIS — M1990 Unspecified osteoarthritis, unspecified site: Secondary | ICD-10-CM | POA: Diagnosis not present

## 2015-02-21 DIAGNOSIS — M47816 Spondylosis without myelopathy or radiculopathy, lumbar region: Secondary | ICD-10-CM | POA: Diagnosis not present

## 2015-02-21 DIAGNOSIS — F419 Anxiety disorder, unspecified: Secondary | ICD-10-CM | POA: Diagnosis not present

## 2015-02-21 DIAGNOSIS — E0590 Thyrotoxicosis, unspecified without thyrotoxic crisis or storm: Secondary | ICD-10-CM | POA: Diagnosis not present

## 2015-02-21 DIAGNOSIS — G8929 Other chronic pain: Secondary | ICD-10-CM | POA: Diagnosis not present

## 2015-02-21 DIAGNOSIS — Z79899 Other long term (current) drug therapy: Secondary | ICD-10-CM | POA: Diagnosis not present

## 2015-02-21 DIAGNOSIS — K219 Gastro-esophageal reflux disease without esophagitis: Secondary | ICD-10-CM | POA: Diagnosis not present

## 2015-02-21 DIAGNOSIS — Z809 Family history of malignant neoplasm, unspecified: Secondary | ICD-10-CM | POA: Diagnosis not present

## 2015-02-28 ENCOUNTER — Ambulatory Visit: Payer: 59 | Admitting: Internal Medicine

## 2015-03-01 DIAGNOSIS — Z01419 Encounter for gynecological examination (general) (routine) without abnormal findings: Secondary | ICD-10-CM | POA: Diagnosis not present

## 2015-03-10 DIAGNOSIS — M1990 Unspecified osteoarthritis, unspecified site: Secondary | ICD-10-CM | POA: Diagnosis not present

## 2015-03-10 DIAGNOSIS — Z79899 Other long term (current) drug therapy: Secondary | ICD-10-CM | POA: Diagnosis not present

## 2015-03-10 DIAGNOSIS — G8929 Other chronic pain: Secondary | ICD-10-CM | POA: Diagnosis not present

## 2015-03-10 DIAGNOSIS — F419 Anxiety disorder, unspecified: Secondary | ICD-10-CM | POA: Diagnosis not present

## 2015-03-10 DIAGNOSIS — Z809 Family history of malignant neoplasm, unspecified: Secondary | ICD-10-CM | POA: Diagnosis not present

## 2015-03-10 DIAGNOSIS — Z780 Asymptomatic menopausal state: Secondary | ICD-10-CM | POA: Diagnosis not present

## 2015-03-10 DIAGNOSIS — M47816 Spondylosis without myelopathy or radiculopathy, lumbar region: Secondary | ICD-10-CM | POA: Diagnosis not present

## 2015-03-10 DIAGNOSIS — K219 Gastro-esophageal reflux disease without esophagitis: Secondary | ICD-10-CM | POA: Diagnosis not present

## 2015-03-10 DIAGNOSIS — E0590 Thyrotoxicosis, unspecified without thyrotoxic crisis or storm: Secondary | ICD-10-CM | POA: Diagnosis not present

## 2015-03-14 DIAGNOSIS — M545 Low back pain: Secondary | ICD-10-CM | POA: Diagnosis not present

## 2015-03-14 DIAGNOSIS — K210 Gastro-esophageal reflux disease with esophagitis: Secondary | ICD-10-CM | POA: Diagnosis not present

## 2015-03-14 DIAGNOSIS — E039 Hypothyroidism, unspecified: Secondary | ICD-10-CM | POA: Diagnosis not present

## 2015-03-14 DIAGNOSIS — J449 Chronic obstructive pulmonary disease, unspecified: Secondary | ICD-10-CM | POA: Diagnosis not present

## 2015-03-21 DIAGNOSIS — K219 Gastro-esophageal reflux disease without esophagitis: Secondary | ICD-10-CM | POA: Diagnosis not present

## 2015-03-21 DIAGNOSIS — M1990 Unspecified osteoarthritis, unspecified site: Secondary | ICD-10-CM | POA: Diagnosis not present

## 2015-03-21 DIAGNOSIS — Z809 Family history of malignant neoplasm, unspecified: Secondary | ICD-10-CM | POA: Diagnosis not present

## 2015-03-21 DIAGNOSIS — G8929 Other chronic pain: Secondary | ICD-10-CM | POA: Diagnosis not present

## 2015-03-21 DIAGNOSIS — Z79899 Other long term (current) drug therapy: Secondary | ICD-10-CM | POA: Diagnosis not present

## 2015-03-21 DIAGNOSIS — Z780 Asymptomatic menopausal state: Secondary | ICD-10-CM | POA: Diagnosis not present

## 2015-03-21 DIAGNOSIS — M47816 Spondylosis without myelopathy or radiculopathy, lumbar region: Secondary | ICD-10-CM | POA: Diagnosis not present

## 2015-03-21 DIAGNOSIS — F419 Anxiety disorder, unspecified: Secondary | ICD-10-CM | POA: Diagnosis not present

## 2015-03-21 DIAGNOSIS — E0590 Thyrotoxicosis, unspecified without thyrotoxic crisis or storm: Secondary | ICD-10-CM | POA: Diagnosis not present

## 2015-04-15 DIAGNOSIS — H3531 Nonexudative age-related macular degeneration: Secondary | ICD-10-CM | POA: Diagnosis not present

## 2015-04-19 DIAGNOSIS — M47816 Spondylosis without myelopathy or radiculopathy, lumbar region: Secondary | ICD-10-CM | POA: Diagnosis not present

## 2015-04-20 DIAGNOSIS — M461 Sacroiliitis, not elsewhere classified: Secondary | ICD-10-CM | POA: Diagnosis not present

## 2015-04-20 DIAGNOSIS — M545 Low back pain: Secondary | ICD-10-CM | POA: Diagnosis not present

## 2015-04-20 DIAGNOSIS — M47816 Spondylosis without myelopathy or radiculopathy, lumbar region: Secondary | ICD-10-CM | POA: Diagnosis not present

## 2015-05-02 DIAGNOSIS — M461 Sacroiliitis, not elsewhere classified: Secondary | ICD-10-CM | POA: Diagnosis not present

## 2015-05-02 DIAGNOSIS — E0590 Thyrotoxicosis, unspecified without thyrotoxic crisis or storm: Secondary | ICD-10-CM | POA: Diagnosis not present

## 2015-05-02 DIAGNOSIS — Z809 Family history of malignant neoplasm, unspecified: Secondary | ICD-10-CM | POA: Diagnosis not present

## 2015-05-02 DIAGNOSIS — Z780 Asymptomatic menopausal state: Secondary | ICD-10-CM | POA: Diagnosis not present

## 2015-05-02 DIAGNOSIS — M1990 Unspecified osteoarthritis, unspecified site: Secondary | ICD-10-CM | POA: Diagnosis not present

## 2015-05-02 DIAGNOSIS — K219 Gastro-esophageal reflux disease without esophagitis: Secondary | ICD-10-CM | POA: Diagnosis not present

## 2015-05-02 DIAGNOSIS — G8929 Other chronic pain: Secondary | ICD-10-CM | POA: Diagnosis not present

## 2015-05-02 DIAGNOSIS — M47816 Spondylosis without myelopathy or radiculopathy, lumbar region: Secondary | ICD-10-CM | POA: Diagnosis not present

## 2015-05-02 DIAGNOSIS — F419 Anxiety disorder, unspecified: Secondary | ICD-10-CM | POA: Diagnosis not present

## 2015-05-02 DIAGNOSIS — Z79899 Other long term (current) drug therapy: Secondary | ICD-10-CM | POA: Diagnosis not present

## 2015-05-03 ENCOUNTER — Encounter: Payer: Self-pay | Admitting: Pulmonary Disease

## 2015-05-19 DIAGNOSIS — M47816 Spondylosis without myelopathy or radiculopathy, lumbar region: Secondary | ICD-10-CM | POA: Diagnosis not present

## 2015-06-10 DIAGNOSIS — M47816 Spondylosis without myelopathy or radiculopathy, lumbar region: Secondary | ICD-10-CM | POA: Diagnosis not present

## 2015-07-05 DIAGNOSIS — M47896 Other spondylosis, lumbar region: Secondary | ICD-10-CM | POA: Diagnosis not present

## 2015-07-05 DIAGNOSIS — F4542 Pain disorder with related psychological factors: Secondary | ICD-10-CM | POA: Diagnosis not present

## 2015-07-05 DIAGNOSIS — F411 Generalized anxiety disorder: Secondary | ICD-10-CM | POA: Diagnosis not present

## 2015-07-06 DIAGNOSIS — D225 Melanocytic nevi of trunk: Secondary | ICD-10-CM | POA: Diagnosis not present

## 2015-07-06 DIAGNOSIS — L570 Actinic keratosis: Secondary | ICD-10-CM | POA: Diagnosis not present

## 2015-07-06 DIAGNOSIS — Z1283 Encounter for screening for malignant neoplasm of skin: Secondary | ICD-10-CM | POA: Diagnosis not present

## 2015-07-06 DIAGNOSIS — X32XXXA Exposure to sunlight, initial encounter: Secondary | ICD-10-CM | POA: Diagnosis not present

## 2015-07-06 DIAGNOSIS — L708 Other acne: Secondary | ICD-10-CM | POA: Diagnosis not present

## 2015-07-06 DIAGNOSIS — D485 Neoplasm of uncertain behavior of skin: Secondary | ICD-10-CM | POA: Diagnosis not present

## 2015-07-13 DIAGNOSIS — E039 Hypothyroidism, unspecified: Secondary | ICD-10-CM | POA: Diagnosis not present

## 2015-07-13 DIAGNOSIS — D485 Neoplasm of uncertain behavior of skin: Secondary | ICD-10-CM | POA: Diagnosis not present

## 2015-07-13 DIAGNOSIS — K210 Gastro-esophageal reflux disease with esophagitis: Secondary | ICD-10-CM | POA: Diagnosis not present

## 2015-07-13 DIAGNOSIS — M545 Low back pain: Secondary | ICD-10-CM | POA: Diagnosis not present

## 2015-07-13 DIAGNOSIS — L98499 Non-pressure chronic ulcer of skin of other sites with unspecified severity: Secondary | ICD-10-CM | POA: Diagnosis not present

## 2015-07-13 DIAGNOSIS — R05 Cough: Secondary | ICD-10-CM | POA: Diagnosis not present

## 2015-07-18 DIAGNOSIS — M1990 Unspecified osteoarthritis, unspecified site: Secondary | ICD-10-CM | POA: Diagnosis not present

## 2015-07-18 DIAGNOSIS — M47816 Spondylosis without myelopathy or radiculopathy, lumbar region: Secondary | ICD-10-CM | POA: Diagnosis not present

## 2015-07-18 DIAGNOSIS — E213 Hyperparathyroidism, unspecified: Secondary | ICD-10-CM | POA: Diagnosis not present

## 2015-07-18 DIAGNOSIS — Z9889 Other specified postprocedural states: Secondary | ICD-10-CM | POA: Diagnosis not present

## 2015-07-18 DIAGNOSIS — K219 Gastro-esophageal reflux disease without esophagitis: Secondary | ICD-10-CM | POA: Diagnosis not present

## 2015-07-18 DIAGNOSIS — M47896 Other spondylosis, lumbar region: Secondary | ICD-10-CM | POA: Diagnosis not present

## 2015-07-18 DIAGNOSIS — G8929 Other chronic pain: Secondary | ICD-10-CM | POA: Diagnosis not present

## 2015-07-18 DIAGNOSIS — F419 Anxiety disorder, unspecified: Secondary | ICD-10-CM | POA: Diagnosis not present

## 2015-07-18 DIAGNOSIS — J302 Other seasonal allergic rhinitis: Secondary | ICD-10-CM | POA: Diagnosis not present

## 2015-07-18 DIAGNOSIS — M549 Dorsalgia, unspecified: Secondary | ICD-10-CM | POA: Diagnosis not present

## 2015-07-19 DIAGNOSIS — M549 Dorsalgia, unspecified: Secondary | ICD-10-CM | POA: Diagnosis not present

## 2015-07-19 DIAGNOSIS — F419 Anxiety disorder, unspecified: Secondary | ICD-10-CM | POA: Diagnosis not present

## 2015-07-19 DIAGNOSIS — M1990 Unspecified osteoarthritis, unspecified site: Secondary | ICD-10-CM | POA: Diagnosis not present

## 2015-07-19 DIAGNOSIS — E213 Hyperparathyroidism, unspecified: Secondary | ICD-10-CM | POA: Diagnosis not present

## 2015-07-19 DIAGNOSIS — K219 Gastro-esophageal reflux disease without esophagitis: Secondary | ICD-10-CM | POA: Diagnosis not present

## 2015-07-19 DIAGNOSIS — M47896 Other spondylosis, lumbar region: Secondary | ICD-10-CM | POA: Diagnosis not present

## 2015-07-25 DIAGNOSIS — M1990 Unspecified osteoarthritis, unspecified site: Secondary | ICD-10-CM | POA: Diagnosis not present

## 2015-07-25 DIAGNOSIS — K219 Gastro-esophageal reflux disease without esophagitis: Secondary | ICD-10-CM | POA: Diagnosis not present

## 2015-07-25 DIAGNOSIS — G8929 Other chronic pain: Secondary | ICD-10-CM | POA: Diagnosis not present

## 2015-07-25 DIAGNOSIS — M47896 Other spondylosis, lumbar region: Secondary | ICD-10-CM | POA: Diagnosis not present

## 2015-07-25 DIAGNOSIS — Z9889 Other specified postprocedural states: Secondary | ICD-10-CM | POA: Diagnosis not present

## 2015-07-25 DIAGNOSIS — F419 Anxiety disorder, unspecified: Secondary | ICD-10-CM | POA: Diagnosis not present

## 2015-07-25 DIAGNOSIS — E039 Hypothyroidism, unspecified: Secondary | ICD-10-CM | POA: Diagnosis not present

## 2015-07-25 DIAGNOSIS — M545 Low back pain: Secondary | ICD-10-CM | POA: Diagnosis not present

## 2015-07-25 DIAGNOSIS — M47816 Spondylosis without myelopathy or radiculopathy, lumbar region: Secondary | ICD-10-CM | POA: Diagnosis not present

## 2015-07-26 DIAGNOSIS — K219 Gastro-esophageal reflux disease without esophagitis: Secondary | ICD-10-CM | POA: Diagnosis not present

## 2015-07-26 DIAGNOSIS — M1990 Unspecified osteoarthritis, unspecified site: Secondary | ICD-10-CM | POA: Diagnosis not present

## 2015-07-26 DIAGNOSIS — F419 Anxiety disorder, unspecified: Secondary | ICD-10-CM | POA: Diagnosis not present

## 2015-07-26 DIAGNOSIS — E039 Hypothyroidism, unspecified: Secondary | ICD-10-CM | POA: Diagnosis not present

## 2015-07-26 DIAGNOSIS — M545 Low back pain: Secondary | ICD-10-CM | POA: Diagnosis not present

## 2015-09-09 DIAGNOSIS — Z23 Encounter for immunization: Secondary | ICD-10-CM | POA: Diagnosis not present

## 2015-10-13 DIAGNOSIS — J449 Chronic obstructive pulmonary disease, unspecified: Secondary | ICD-10-CM | POA: Diagnosis not present

## 2015-10-13 DIAGNOSIS — K210 Gastro-esophageal reflux disease with esophagitis: Secondary | ICD-10-CM | POA: Diagnosis not present

## 2015-10-13 DIAGNOSIS — M545 Low back pain: Secondary | ICD-10-CM | POA: Diagnosis not present

## 2015-10-13 DIAGNOSIS — J309 Allergic rhinitis, unspecified: Secondary | ICD-10-CM | POA: Diagnosis not present

## 2015-10-19 ENCOUNTER — Encounter (INDEPENDENT_AMBULATORY_CARE_PROVIDER_SITE_OTHER): Payer: Self-pay | Admitting: *Deleted

## 2015-10-19 DIAGNOSIS — K219 Gastro-esophageal reflux disease without esophagitis: Secondary | ICD-10-CM | POA: Diagnosis not present

## 2015-10-19 DIAGNOSIS — M545 Low back pain: Secondary | ICD-10-CM | POA: Diagnosis not present

## 2015-10-19 DIAGNOSIS — R1110 Vomiting, unspecified: Secondary | ICD-10-CM | POA: Diagnosis not present

## 2015-10-19 DIAGNOSIS — M47816 Spondylosis without myelopathy or radiculopathy, lumbar region: Secondary | ICD-10-CM | POA: Diagnosis not present

## 2015-10-19 DIAGNOSIS — Z79899 Other long term (current) drug therapy: Secondary | ICD-10-CM | POA: Diagnosis not present

## 2015-10-19 DIAGNOSIS — M1990 Unspecified osteoarthritis, unspecified site: Secondary | ICD-10-CM | POA: Diagnosis not present

## 2015-10-27 ENCOUNTER — Other Ambulatory Visit (INDEPENDENT_AMBULATORY_CARE_PROVIDER_SITE_OTHER): Payer: Self-pay | Admitting: Internal Medicine

## 2015-10-27 ENCOUNTER — Encounter (INDEPENDENT_AMBULATORY_CARE_PROVIDER_SITE_OTHER): Payer: Self-pay | Admitting: Internal Medicine

## 2015-10-27 ENCOUNTER — Ambulatory Visit (INDEPENDENT_AMBULATORY_CARE_PROVIDER_SITE_OTHER): Payer: Medicare Other | Admitting: Internal Medicine

## 2015-10-27 ENCOUNTER — Telehealth (INDEPENDENT_AMBULATORY_CARE_PROVIDER_SITE_OTHER): Payer: Self-pay | Admitting: *Deleted

## 2015-10-27 ENCOUNTER — Encounter (INDEPENDENT_AMBULATORY_CARE_PROVIDER_SITE_OTHER): Payer: Self-pay

## 2015-10-27 VITALS — BP 108/52 | HR 64 | Temp 97.7°F | Ht 64.0 in | Wt 141.6 lb

## 2015-10-27 DIAGNOSIS — R195 Other fecal abnormalities: Secondary | ICD-10-CM | POA: Diagnosis not present

## 2015-10-27 DIAGNOSIS — R1030 Lower abdominal pain, unspecified: Secondary | ICD-10-CM | POA: Diagnosis not present

## 2015-10-27 DIAGNOSIS — K625 Hemorrhage of anus and rectum: Secondary | ICD-10-CM

## 2015-10-27 DIAGNOSIS — Z800 Family history of malignant neoplasm of digestive organs: Secondary | ICD-10-CM

## 2015-10-27 DIAGNOSIS — R634 Abnormal weight loss: Secondary | ICD-10-CM | POA: Diagnosis not present

## 2015-10-27 DIAGNOSIS — Z1211 Encounter for screening for malignant neoplasm of colon: Secondary | ICD-10-CM

## 2015-10-27 NOTE — Patient Instructions (Signed)
The risks and benefits such as perforation, bleeding, and infection were reviewed with the patient and is agreeable. 

## 2015-10-27 NOTE — Progress Notes (Signed)
Subjective:    Patient ID: [name], female    DOB: 1941-01-03, 74 y.o.   MRN: [id number]  HPI She is a new patieint.  She says anything she eats, it upsets her stomach.She says she has lower abdominal pain. She says she has only been eating oatmeal and jello. Her symptoms stared all over a sudden.  She tells me her sister probably had colon cancer. She says her sister had the gene for cancer ?Marland Kitchen She says she has a hard time eating. She says everytime she eats, she has lower abdominal pain. She was started on Protonix one week ago and some of her symptoms are better. She says she has GERD and the Protonix is helping.  She has an episode of incontence last week with some blood.  She says she has lost about pounds over the past weeks. Her symptoms started 3 weeks. She is not having any diarrhea. She says ever since she had her GB her BMs have been regular up until 2 weeks ago. . She says she has had one BM in the  2 weeks. She tells me now she is constipated.  No dysphagia at this time. Patient is really worried she may have colon cancer.  Her last colonoscopy was in 2010. High risk screening colonoscopy. Her sister had recently been diagnosed with metastatic colon carcinoma died within 3 weeks of diagnosis at age 52.  Normal terminal ileum. Erythema of the appendiceal orifice and appearance suggestive of extrinsic lesion.    Review of Systems Past Medical History  Diagnosis Date  . Chronic sinusitis     Past Surgical History  Procedure Laterality Date  . Abdominal hysterectomy      1985  . Back surgery    . Back stimulatory    . Tonsillectomy    . Cholecystectomy      2014, non-functioning GB    No Known Allergies  Current Outpatient Prescriptions on File Prior to Visit  Medication Sig Dispense Refill  . ALPRAZolam (XANAX) 1 MG tablet Take 1 mg by mouth at bedtime.    . celecoxib (CELEBREX) 200 MG capsule Take 200 mg by mouth daily.    Marland Kitchen esomeprazole (NEXIUM) 40 MG  capsule Take 40 mg by mouth daily at 12 noon.    Marland Kitchen FLUoxetine (PROZAC) 20 MG tablet Take 20 mg by mouth daily.    Marland Kitchen HYDROcodone-acetaminophen (NORCO/VICODIN) 5-325 MG per tablet Take 1 tablet by mouth every 6 (six) hours as needed for moderate pain.    . traMADol (ULTRAM) 50 MG tablet 1-2 every 4 hours as needed for cough or pain 40 tablet 0   No current facility-administered medications on file prior to visit.        Objective:   Physical Exam Blood pressure 108/52, pulse 64, temperature 97.7 F (36.5 C), height 5\' 4"  (1.626 m), weight 141 lb 9.6 oz (64.229 kg). Alert and oriented. Skin warm and dry. Oral mucosa is moist.   . Sclera anicteric, conjunctivae is pink. Thyroid not enlarged. No cervical lymphadenopathy. Lungs clear. Heart regular rate and rhythm.  Abdomen is soft. Bowel sounds are positive. No hepatomegaly. No abdominal masses felt. No tenderness.  No edema to lower extremities. Stool brown and guaiac negative.   Lot [id number] Ex 9/17     Assessment & Plan:  Change in stool, abdominal pain, weight loss. Rectal bleeding. Family hx of colon cancer. Colonic neoplasm needs to be ruled out.  The risks and benefits such as perforation, bleeding,  and infection were reviewed with the patient and is agreeable.

## 2015-10-27 NOTE — Telephone Encounter (Signed)
Patient needs trilyte 

## 2015-10-31 MED ORDER — PEG 3350-KCL-NA BICARB-NACL 420 G PO SOLR: 4000.0000 mL | Freq: Once | ORAL | Status: DC

## 2015-11-11 ENCOUNTER — Encounter (HOSPITAL_COMMUNITY)
Admission: RE | Disposition: A | Discharge status: Home / Self Care | Payer: Self-pay | Source: Ambulatory Visit | Attending: Internal Medicine

## 2015-11-11 ENCOUNTER — Encounter (HOSPITAL_COMMUNITY): Payer: Self-pay | Admitting: *Deleted

## 2015-11-11 ENCOUNTER — Ambulatory Visit (HOSPITAL_COMMUNITY)
Admission: RE | Admit: 2015-11-11 | Discharge: 2015-11-11 | Disposition: A | Discharge status: Home / Self Care | Payer: Medicare Other | Source: Ambulatory Visit | Attending: Internal Medicine | Admitting: Internal Medicine

## 2015-11-11 DIAGNOSIS — E039 Hypothyroidism, unspecified: Secondary | ICD-10-CM | POA: Insufficient documentation

## 2015-11-11 DIAGNOSIS — R109 Unspecified abdominal pain: Secondary | ICD-10-CM | POA: Insufficient documentation

## 2015-11-11 DIAGNOSIS — F419 Anxiety disorder, unspecified: Secondary | ICD-10-CM | POA: Diagnosis not present

## 2015-11-11 DIAGNOSIS — R1030 Lower abdominal pain, unspecified: Secondary | ICD-10-CM

## 2015-11-11 DIAGNOSIS — K644 Residual hemorrhoidal skin tags: Secondary | ICD-10-CM | POA: Diagnosis not present

## 2015-11-11 DIAGNOSIS — Z90710 Acquired absence of both cervix and uterus: Secondary | ICD-10-CM | POA: Insufficient documentation

## 2015-11-11 DIAGNOSIS — Z7982 Long term (current) use of aspirin: Secondary | ICD-10-CM | POA: Diagnosis not present

## 2015-11-11 DIAGNOSIS — K625 Hemorrhage of anus and rectum: Secondary | ICD-10-CM | POA: Diagnosis not present

## 2015-11-11 DIAGNOSIS — R195 Other fecal abnormalities: Secondary | ICD-10-CM

## 2015-11-11 DIAGNOSIS — R634 Abnormal weight loss: Secondary | ICD-10-CM

## 2015-11-11 DIAGNOSIS — K5900 Constipation, unspecified: Secondary | ICD-10-CM | POA: Diagnosis not present

## 2015-11-11 DIAGNOSIS — Z800 Family history of malignant neoplasm of digestive organs: Secondary | ICD-10-CM | POA: Insufficient documentation

## 2015-11-11 DIAGNOSIS — K648 Other hemorrhoids: Secondary | ICD-10-CM | POA: Diagnosis not present

## 2015-11-11 HISTORY — DX: Hypothyroidism, unspecified: E03.9

## 2015-11-11 HISTORY — DX: Anxiety disorder, unspecified: F41.9

## 2015-11-11 HISTORY — PX: COLONOSCOPY: SHX5424

## 2015-11-11 SURGERY — COLONOSCOPY
Anesthesia: Moderate Sedation

## 2015-11-11 MED ORDER — MIDAZOLAM HCL 5 MG/5ML IJ SOLN
INTRAMUSCULAR | Status: DC | PRN
  Administered 2015-11-11 (×5): 2 mg via INTRAVENOUS
  Administered 2015-11-11: 3 mg via INTRAVENOUS
  Administered 2015-11-11: 2 mg via INTRAVENOUS

## 2015-11-11 MED ORDER — SODIUM CHLORIDE 0.9 % IV SOLN
INTRAVENOUS | Status: DC
  Administered 2015-11-11: 14:00:00 via INTRAVENOUS

## 2015-11-11 MED ORDER — MEPERIDINE HCL 50 MG/ML IJ SOLN
INTRAMUSCULAR | Status: DC | PRN
  Administered 2015-11-11 (×4): 25 mg via INTRAVENOUS

## 2015-11-11 MED ORDER — MEPERIDINE HCL 50 MG/ML IJ SOLN
INTRAMUSCULAR | Status: DC
Start: 2015-11-11 — End: 2015-11-11
  Filled 2015-11-11: qty 1

## 2015-11-11 MED ORDER — MIDAZOLAM HCL 5 MG/5ML IJ SOLN
INTRAMUSCULAR | Status: AC
  Filled 2015-11-11: qty 10

## 2015-11-11 MED ORDER — ONDANSETRON HCL 4 MG/2ML IJ SOLN
4.0000 mg | Freq: Once | INTRAMUSCULAR | Status: AC
  Administered 2015-11-11: 4 mg via INTRAVENOUS

## 2015-11-11 MED ORDER — STERILE WATER FOR IRRIGATION IR SOLN
Status: DC | PRN
  Administered 2015-11-11: 14:00:00

## 2015-11-11 MED ORDER — ONDANSETRON HCL 4 MG/2ML IJ SOLN
INTRAMUSCULAR | Status: AC
  Filled 2015-11-11: qty 2

## 2015-11-11 MED ORDER — MIDAZOLAM HCL 5 MG/5ML IJ SOLN
INTRAMUSCULAR | Status: AC
  Filled 2015-11-11: qty 5

## 2015-11-11 NOTE — Op Note (Signed)
COLONOSCOPY PROCEDURE REPORT  PATIENT:  [name]  MR#:  [id number] Birthdate:  1941/06/19, 74 y.o., female Endoscopist:  Dr. Rogene Houston, MD Referred By:  Dr. Alonza Bogus, MD Procedure Date: 11/11/2015  Procedure:   Colonoscopy  Indications:  Patient is 74 year old Caucasian female was evaluated in emergency room bout 4 weeks ago for abdominal pain. Since then she had an episode of rectal bleeding. She also has noted constipation. Family history significant for CRC and sister who died of metastatic disease within few weeks of diagnosis at age 42. His last colonoscopy was in May 2010.  Informed Consent:  The procedure and risks were reviewed with the patient and informed consent was obtained.  Medications:  Demerol 100 mg IV Versed 15 mg IV  Description of procedure:  After a digital rectal exam was performed, that colonoscope was advanced from the anus through the rectum and colon to the area of the cecum, ileocecal valve and appendiceal orifice. The cecum was deeply intubated. These structures were well-seen and photographed for the record. From the level of the cecum and ileocecal valve, the scope was slowly and cautiously withdrawn. The mucosal surfaces were carefully surveyed utilizing scope tip to flexion to facilitate fold flattening as needed. The scope was pulled down into the rectum where a thorough exam including retroflexion was performed. Ultra Slim scope was also used for this procedure.  Findings:   Prep excellent. Normal mucosa of cecum, ascending colon, hepatic flexure, transverse colon, splenic flexure, descending and sigmoid colon. Normal rectal mucosa. Small hemorrhoids below the dentate line.   Therapeutic/Diagnostic Maneuvers Performed:   None  Complications:  None  EBL: none  Cecal Withdrawal Time:  8 minutes  Impression:  Examination performed to cecum. Small external hemorrhoids otherwise normal colonoscopy.  Recommendations:  Standard  instructions given. High fiber diet. Colonoscopy in 5 years. Patient will call if abdominal pain recurs.  REHMAN,NAJEEB U  11/11/2015 3:41 PM  CC: Dr. Alonza Bogus, MD & Dr. Rayne Du ref. provider found

## 2015-11-11 NOTE — Discharge Instructions (Signed)
Resume usual medications and high fiber diet. Can take stool softener constipation if needed(Colace 200 mg by mouth daily at bedtime0. No driving for 24 hours. Next colonoscopy in 5 years. Call if abdominal pain recurs.  High-Fiber Diet Fiber, also called dietary fiber, is a type of carbohydrate found in fruits, vegetables, whole grains, and beans. A high-fiber diet can have many health benefits. Your health care provider may recommend a high-fiber diet to help:  Prevent constipation. Fiber can make your bowel movements more regular.  Lower your cholesterol.  Relieve hemorrhoids, uncomplicated diverticulosis, or irritable bowel syndrome.  Prevent overeating as part of a weight-loss plan.  Prevent heart disease, type 2 diabetes, and certain cancers. WHAT IS MY PLAN? The recommended daily intake of fiber includes:  38 grams for men under age 81.  13 grams for men over age 44.  13 grams for women under age 33.  63 grams for women over age 66. You can get the recommended daily intake of dietary fiber by eating a variety of fruits, vegetables, grains, and beans. Your health care provider may also recommend a fiber supplement if it is not possible to get enough fiber through your diet. WHAT DO I NEED TO KNOW ABOUT A HIGH-FIBER DIET?  Fiber supplements have not been widely studied for their effectiveness, so it is better to get fiber through food sources.  Always check the fiber content on thenutrition facts label of any prepackaged food. Look for foods that contain at least 5 grams of fiber per serving.  Ask your dietitian if you have questions about specific foods that are related to your condition, especially if those foods are not listed in the following section.  Increase your daily fiber consumption gradually. Increasing your intake of dietary fiber too quickly may cause bloating, cramping, or gas.  Drink plenty of water. Water helps you to digest fiber. WHAT FOODS CAN I  EAT? Grains Whole-grain breads. Multigrain cereal. Oats and oatmeal. Brown rice. Barley. Bulgur wheat. [place]. Bran muffins. Popcorn. Rye wafer crackers. Vegetables Sweet potatoes. Spinach. Kale. Artichokes. Cabbage. Broccoli. Green peas. Carrots. Squash. Fruits Berries. Pears. Apples. Oranges. Avocados. Prunes and raisins. Dried figs. Meats and Other Protein Sources Navy, kidney, pinto, and soy beans. Split peas. Lentils. Nuts and seeds. Dairy Fiber-fortified yogurt. Beverages Fiber-fortified soy milk. Fiber-fortified orange juice. Other Fiber bars. The items listed above may not be a complete list of recommended foods or beverages. Contact your dietitian for more options. WHAT FOODS ARE NOT RECOMMENDED? Grains White bread. Pasta made with refined flour. White rice. Vegetables Fried potatoes. Canned vegetables. Well-cooked vegetables.  Fruits Fruit juice. Cooked, strained fruit. Meats and Other Protein Sources Fatty cuts of meat. Fried Sales executive or fried fish. Dairy Milk. Yogurt. Cream cheese. Sour cream. Beverages Soft drinks. Other Cakes and pastries. Butter and oils. The items listed above may not be a complete list of foods and beverages to avoid. Contact your dietitian for more information. WHAT ARE SOME TIPS FOR INCLUDING HIGH-FIBER FOODS IN MY DIET?  Eat a wide variety of high-fiber foods.  Make sure that half of all grains consumed each day are whole grains.  Replace breads and cereals made from refined flour or white flour with whole-grain breads and cereals.  Replace white rice with brown rice, bulgur wheat, or millet.  Start the day with a breakfast that is high in fiber, such as a cereal that contains at least 5 grams of fiber per serving.  Use beans in place of meat in soups,  salads, or pasta.  Eat high-fiber snacks, such as berries, raw vegetables, nuts, or popcorn.   This information is not intended to replace advice given to you by your health care  provider. Make sure you discuss any questions you have with your health care provider.   Document Released: 11/12/2005 Document Revised: 12/03/2014 Document Reviewed: 04/27/2014 Elsevier Interactive Patient Education 2016 [employer]. Colonoscopy, Care After These instructions give you information on caring for yourself after your procedure. Your doctor may also give you more specific instructions. Call your doctor if you have any problems or questions after your procedure. HOME CARE  Do not drive for 24 hours.  Do not sign important papers or use machinery for 24 hours.  You may shower.  You may go back to your usual activities, but go slower for the first 24 hours.  Take rest breaks often during the first 24 hours.  Walk around or use warm packs on your belly (abdomen) if you have belly cramping or gas.  Drink enough fluids to keep your pee (urine) clear or pale yellow.  Resume your normal diet. Avoid heavy or fried foods.  Avoid drinking alcohol for 24 hours or as told by your doctor.  Only take medicines as told by your doctor. If a tissue sample (biopsy) was taken during the procedure:   Do not take aspirin or blood thinners for 7 days, or as told by your doctor.  Do not drink alcohol for 7 days, or as told by your doctor.  Eat soft foods for the first 24 hours. GET HELP IF: You still have a small amount of blood in your poop (stool) 2-3 days after the procedure. GET HELP RIGHT AWAY IF:  You have more than a small amount of blood in your poop.  You see clumps of tissue (blood clots) in your poop.  Your belly is puffy (swollen).  You feel sick to your stomach (nauseous) or throw up (vomit).  You have a fever.  You have belly pain that gets worse and medicine does not help. MAKE SURE YOU:  Understand these instructions.  Will watch your condition.  Will get help right away if you are not doing well or get worse.   This information is not intended to  replace advice given to you by your health care provider. Make sure you discuss any questions you have with your health care provider.   Document Released: 12/15/2010 Document Revised: 11/17/2013 Document Reviewed: 07/20/2013 Elsevier Interactive Patient Education [employer].

## 2015-11-11 NOTE — H&P (Signed)
[name] is an 74 y.o. female.   Chief Complaint: Patient is here for colonoscopy. HPI: Patient is 74 year old Caucasian female who is here for diagnostic colonoscopy. Bladder month ago she had abdominal pain and was seen in emergency room at [facility]. She had single episode of rectal bleeding. She has remained good mild abdominal pain. She denies anorexia or weight loss. Last colonoscopy was in May 2010. Family history is significant for CRC and sister who was diagnosed with metastatic colon carcinoma at age 33 and died within few weeks. Patient was advised to return for screening in 5 years she has not until now.  Past Medical History  Diagnosis Date  . Chronic sinusitis   . Anxiety   . Hypothyroidism     Past Surgical History  Procedure Laterality Date  . Abdominal hysterectomy      1985  . Back surgery    . Back stimulatory    . Tonsillectomy    . Cholecystectomy      2014, non-functioning GB    Family History  Problem Relation Age of Onset  . Allergies Mother   . Allergies Daughter   . Hodgkin's lymphoma Father   . Liver cancer Sister     "rare liver cancer"   Social History:  reports that she has never smoked. She has never used smokeless tobacco. She reports that she does not drink alcohol or use illicit drugs.  Allergies: No Known Allergies  Medications Prior to Admission  Medication Sig Dispense Refill  . ALPRAZolam (XANAX) 1 MG tablet Take 1 mg by mouth at bedtime.    . Ascorbic Acid (VITAMIN C) 1000 MG tablet Take 1,000 mg by mouth daily.    Marland Kitchen aspirin 81 MG tablet Take 81 mg by mouth daily.    . celecoxib (CELEBREX) 200 MG capsule Take 200 mg by mouth daily.    . cetirizine (ZYRTEC) 10 MG tablet Take 10 mg by mouth daily.    . Cholecalciferol (D3 MAXIMUM STRENGTH) 5000 UNITS capsule Take 5,000 Units by mouth daily.    . cyanocobalamin 2000 MCG tablet Take 2,000 mcg by mouth daily.    Marland Kitchen esomeprazole (NEXIUM) 40 MG capsule Take 40 mg by mouth daily at 12 noon.     Marland Kitchen FLUoxetine (PROZAC) 20 MG tablet Take 20 mg by mouth daily.    Marland Kitchen HYDROcodone-acetaminophen (NORCO/VICODIN) 5-325 MG per tablet Take 1 tablet by mouth every 6 (six) hours as needed for moderate pain.    Marland Kitchen levothyroxine (SYNTHROID, LEVOTHROID) 50 MCG tablet Take 50 mcg by mouth daily before breakfast.    . Magnesium 400 MG TABS Take 400 mg by mouth daily.    . Omega-3 Fatty Acids (FISH OIL) 1000 MG CAPS Take 1 capsule by mouth daily.    . pantoprazole (PROTONIX) 40 MG tablet Take 40 mg by mouth daily.    . polyethylene glycol-electrolytes (NULYTELY/GOLYTELY) 420 G solution Take 4,000 mLs by mouth once. 4000 mL 0  . naproxen sodium (ANAPROX) 220 MG tablet Take 220 mg by mouth daily.    . traMADol (ULTRAM) 50 MG tablet 1-2 every 4 hours as needed for cough or pain (Patient not taking: Reported on 11/08/2015) 40 tablet 0    No results found for this or any previous visit (from the past 48 hour(s)). No results found.  ROS  Blood pressure 127/64, pulse 85, temperature 98.3 F (36.8 C), temperature source Oral, resp. rate 19, height 5\' 4"  (1.626 m), weight 141 lb (63.957 kg), SpO2 100 %.  Physical Exam  Constitutional: She appears well-developed and well-nourished.  HENT:  Mouth/Throat: Oropharynx is clear and moist.  Eyes: Conjunctivae are normal. No scleral icterus.  Neck: No thyromegaly present.  Cardiovascular: Normal rate and regular rhythm.   No murmur heard. Faint systolic ejection murmur at left sternal border.  Respiratory: Effort normal and breath sounds normal.  GI:  Abdomen is symmetrical and soft. Mild unlaced tenderness without organomegaly or masses.  Musculoskeletal: She exhibits no edema.  She has a spinal stimulator in right flank.  Lymphadenopathy:    She has no cervical adenopathy.  Neurological: She is alert.  Skin: Skin is warm and dry.     Assessment/Plan Rectal bleeding and abdominal pain. Family history of CRC in sister. Diagnostic/high-risk screening  colonoscopy.  [name] U 11/11/2015, 2:44 PM

## 2015-11-17 ENCOUNTER — Encounter (HOSPITAL_COMMUNITY): Payer: Self-pay | Admitting: Internal Medicine

## 2016-02-07 DIAGNOSIS — H25813 Combined forms of age-related cataract, bilateral: Secondary | ICD-10-CM | POA: Diagnosis not present

## 2016-02-07 DIAGNOSIS — H52223 Regular astigmatism, bilateral: Secondary | ICD-10-CM | POA: Diagnosis not present

## 2016-02-07 DIAGNOSIS — H353132 Nonexudative age-related macular degeneration, bilateral, intermediate dry stage: Secondary | ICD-10-CM | POA: Diagnosis not present

## 2016-02-07 DIAGNOSIS — H5203 Hypermetropia, bilateral: Secondary | ICD-10-CM | POA: Diagnosis not present

## 2016-02-13 DIAGNOSIS — K210 Gastro-esophageal reflux disease with esophagitis: Secondary | ICD-10-CM | POA: Diagnosis not present

## 2016-02-13 DIAGNOSIS — G4700 Insomnia, unspecified: Secondary | ICD-10-CM | POA: Diagnosis not present

## 2016-02-13 DIAGNOSIS — M545 Low back pain: Secondary | ICD-10-CM | POA: Diagnosis not present

## 2016-02-13 DIAGNOSIS — F419 Anxiety disorder, unspecified: Secondary | ICD-10-CM | POA: Diagnosis not present

## 2016-03-12 DIAGNOSIS — H5203 Hypermetropia, bilateral: Secondary | ICD-10-CM | POA: Diagnosis not present

## 2016-03-12 DIAGNOSIS — H401424 Capsular glaucoma with pseudoexfoliation of lens, left eye, indeterminate stage: Secondary | ICD-10-CM | POA: Diagnosis not present

## 2016-03-12 DIAGNOSIS — H2513 Age-related nuclear cataract, bilateral: Secondary | ICD-10-CM | POA: Diagnosis not present

## 2016-03-12 DIAGNOSIS — H353132 Nonexudative age-related macular degeneration, bilateral, intermediate dry stage: Secondary | ICD-10-CM | POA: Diagnosis not present

## 2016-03-13 DIAGNOSIS — H2511 Age-related nuclear cataract, right eye: Secondary | ICD-10-CM | POA: Diagnosis not present

## 2016-03-13 NOTE — Patient Instructions (Signed)
Your procedure is scheduled on: 03/22/2016   Report to [facility] at  800  AM.  Call this number if you have problems the morning of surgery: [phone]   Do not eat food or drink liquids :After Midnight.      Take these medicines the morning of surgery with A SIP OF WATER: xanax, celebrex, zyrtec, prozac, hydrocodone, levothyroxine, protonix, ultram.   Do not wear jewelry, make-up or nail polish.  Do not wear lotions, powders, or perfumes. You may wear deodorant.  Do not shave 48 hours prior to surgery.  Do not bring valuables to the hospital.  Contacts, dentures or bridgework may not be worn into surgery.  Leave suitcase in the car. After surgery it may be brought to your room.  For patients admitted to the hospital, checkout time is 11:00 AM the day of discharge.   Patients discharged the day of surgery will not be allowed to drive home.  :     Please read over the following fact sheets that you were given: Coughing and Deep Breathing, Surgical Site Infection Prevention, Anesthesia Post-op Instructions and Care and Recovery After Surgery    Cataract A cataract is a clouding of the lens of the eye. When a lens becomes cloudy, vision is reduced based on the degree and nature of the clouding. Many cataracts reduce vision to some degree. Some cataracts make people more near-sighted as they develop. Other cataracts increase glare. Cataracts that are ignored and become worse can sometimes look white. The white color can be seen through the pupil. CAUSES   Aging. However, cataracts may occur at any age, even in newborns.   Certain drugs.   Trauma to the eye.   Certain diseases such as diabetes.   Specific eye diseases such as chronic inflammation inside the eye or a sudden attack of a rare form of glaucoma.   Inherited or acquired medical problems.  SYMPTOMS   Gradual, progressive drop in vision in the affected eye.   Severe, rapid visual loss. This most often happens when trauma  is the cause.  DIAGNOSIS  To detect a cataract, an eye doctor examines the lens. Cataracts are best diagnosed with an exam of the eyes with the pupils enlarged (dilated) by drops.  TREATMENT  For an early cataract, vision may improve by using different eyeglasses or stronger lighting. If that does not help your vision, surgery is the only effective treatment. A cataract needs to be surgically removed when vision loss interferes with your everyday activities, such as driving, reading, or watching TV. A cataract may also have to be removed if it prevents examination or treatment of another eye problem. Surgery removes the cloudy lens and usually replaces it with a substitute lens (intraocular lens, IOL).  At a time when both you and your doctor agree, the cataract will be surgically removed. If you have cataracts in both eyes, only one is usually removed at a time. This allows the operated eye to heal and be out of danger from any possible problems after surgery (such as infection or poor wound healing). In rare cases, a cataract may be doing damage to your eye. In these cases, your caregiver may advise surgical removal right away. The vast majority of people who have cataract surgery have better vision afterward. HOME CARE INSTRUCTIONS  If you are not planning surgery, you may be asked to do the following:  Use different eyeglasses.   Use stronger or brighter lighting.   Ask your  eye doctor about reducing your medicine dose or changing medicines if it is thought that a medicine caused your cataract. Changing medicines does not make the cataract go away on its own.   Become familiar with your surroundings. Poor vision can lead to injury. Avoid bumping into things on the affected side. You are at a higher risk for tripping or falling.   Exercise extreme care when driving or operating machinery.   Wear sunglasses if you are sensitive to bright light or experiencing problems with glare.  SEEK  IMMEDIATE MEDICAL CARE IF:   You have a worsening or sudden vision loss.   You notice redness, swelling, or increasing pain in the eye.   You have a fever.  Document Released: 11/12/2005 Document Revised: 11/01/2011 Document Reviewed: 07/06/2011 [facility] Patient Information 2012 [place].PATIENT INSTRUCTIONS POST-ANESTHESIA  IMMEDIATELY FOLLOWING SURGERY:  Do not drive or operate machinery for the first twenty four hours after surgery.  Do not make any important decisions for twenty four hours after surgery or while taking narcotic pain medications or sedatives.  If you develop intractable nausea and vomiting or a severe headache please notify your doctor immediately.  FOLLOW-UP:  Please make an appointment with your surgeon as instructed. You do not need to follow up with anesthesia unless specifically instructed to do so.  WOUND CARE INSTRUCTIONS (if applicable):  Keep a dry clean dressing on the anesthesia/puncture wound site if there is drainage.  Once the wound has quit draining you may leave it open to air.  Generally you should leave the bandage intact for twenty four hours unless there is drainage.  If the epidural site drains for more than 36-48 hours please call the anesthesia department.  QUESTIONS?:  Please feel free to call your physician or the hospital operator if you have any questions, and they will be happy to assist you.

## 2016-03-14 ENCOUNTER — Encounter (HOSPITAL_COMMUNITY)
Admission: RE | Admit: 2016-03-14 | Discharge: 2016-03-14 | Disposition: A | Discharge status: Home / Self Care | Payer: Medicare Other | Source: Ambulatory Visit | Attending: Ophthalmology | Admitting: Ophthalmology

## 2016-03-14 ENCOUNTER — Encounter (HOSPITAL_COMMUNITY): Payer: Self-pay

## 2016-03-14 DIAGNOSIS — Z01812 Encounter for preprocedural laboratory examination: Secondary | ICD-10-CM | POA: Diagnosis not present

## 2016-03-14 DIAGNOSIS — Z01810 Encounter for preprocedural cardiovascular examination: Secondary | ICD-10-CM | POA: Diagnosis not present

## 2016-03-14 HISTORY — DX: Unspecified macular degeneration: H35.30

## 2016-03-14 HISTORY — DX: Fibromyalgia: M79.7

## 2016-03-14 HISTORY — DX: Other specified postprocedural states: R11.2

## 2016-03-14 HISTORY — DX: Other chronic pain: G89.29

## 2016-03-14 HISTORY — DX: Unspecified glaucoma: H40.9

## 2016-03-14 HISTORY — DX: Major depressive disorder, single episode, unspecified: F32.9

## 2016-03-14 HISTORY — DX: Gastro-esophageal reflux disease without esophagitis: K21.9

## 2016-03-14 HISTORY — DX: Dorsalgia, unspecified: M54.9

## 2016-03-14 HISTORY — DX: Other specified postprocedural states: Z98.890

## 2016-03-14 HISTORY — DX: Depression, unspecified: F32.A

## 2016-03-14 LAB — CBC WITH DIFFERENTIAL/PLATELET
Basophils Absolute: 0 10*3/uL (ref 0.0–0.1)
Basophils Relative: 0 %
Eosinophils Absolute: 0.2 10*3/uL (ref 0.0–0.7)
Eosinophils Relative: 3 %
HEMATOCRIT: 37.5 % (ref 36.0–46.0)
HEMOGLOBIN: 12.3 g/dL (ref 12.0–15.0)
LYMPHS ABS: 0.9 10*3/uL (ref 0.7–4.0)
LYMPHS PCT: 17 %
MCH: 30.3 pg (ref 26.0–34.0)
MCHC: 32.8 g/dL (ref 30.0–36.0)
MCV: 92.4 fL (ref 78.0–100.0)
MONO ABS: 0.5 10*3/uL (ref 0.1–1.0)
MONOS PCT: 10 %
NEUTROS ABS: 3.6 10*3/uL (ref 1.7–7.7)
NEUTROS PCT: 70 %
Platelets: 249 10*3/uL (ref 150–400)
RBC: 4.06 MIL/uL (ref 3.87–5.11)
RDW: 13.7 % (ref 11.5–15.5)
WBC: 5.1 10*3/uL (ref 4.0–10.5)

## 2016-03-14 LAB — BASIC METABOLIC PANEL
Anion gap: 12 (ref 5–15)
BUN: 25 mg/dL — AB (ref 6–20)
CHLORIDE: 105 mmol/L (ref 101–111)
CO2: 21 mmol/L — AB (ref 22–32)
CREATININE: 1.23 mg/dL — AB (ref 0.44–1.00)
Calcium: 9 mg/dL (ref 8.9–10.3)
GFR calc non Af Amer: 43 mL/min — ABNORMAL LOW (ref >60)
GFR, EST AFRICAN AMERICAN: 50 mL/min — AB (ref >60)
GLUCOSE: 86 mg/dL (ref 65–99)
Potassium: 4.3 mmol/L (ref 3.5–5.1)
Sodium: 138 mmol/L (ref 135–145)

## 2016-03-14 NOTE — Pre-Procedure Instructions (Signed)
Patient given information to sign up for my chart at home. 

## 2016-03-14 NOTE — Pre-Procedure Instructions (Signed)
Patient in for PAT. She has a spinal cord stimulator and did not bring remote so we are unable to do EKG today. She is to bring her remote with her morning of surgery so we can do her EKG and her procedure.

## 2016-03-22 ENCOUNTER — Ambulatory Visit (HOSPITAL_COMMUNITY): Payer: Medicare Other | Admitting: Anesthesiology

## 2016-03-22 ENCOUNTER — Encounter (HOSPITAL_COMMUNITY)
Admission: RE | Disposition: A | Discharge status: Home / Self Care | Payer: Self-pay | Source: Ambulatory Visit | Attending: Ophthalmology

## 2016-03-22 ENCOUNTER — Encounter (HOSPITAL_COMMUNITY): Payer: Self-pay | Admitting: *Deleted

## 2016-03-22 ENCOUNTER — Ambulatory Visit (HOSPITAL_COMMUNITY)
Admission: RE | Admit: 2016-03-22 | Discharge: 2016-03-22 | Disposition: A | Discharge status: Home / Self Care | Payer: Medicare Other | Source: Ambulatory Visit | Attending: Ophthalmology | Admitting: Ophthalmology

## 2016-03-22 DIAGNOSIS — Z7982 Long term (current) use of aspirin: Secondary | ICD-10-CM | POA: Insufficient documentation

## 2016-03-22 DIAGNOSIS — Z79899 Other long term (current) drug therapy: Secondary | ICD-10-CM | POA: Diagnosis not present

## 2016-03-22 DIAGNOSIS — H2511 Age-related nuclear cataract, right eye: Secondary | ICD-10-CM | POA: Insufficient documentation

## 2016-03-22 DIAGNOSIS — M797 Fibromyalgia: Secondary | ICD-10-CM | POA: Insufficient documentation

## 2016-03-22 DIAGNOSIS — E039 Hypothyroidism, unspecified: Secondary | ICD-10-CM | POA: Insufficient documentation

## 2016-03-22 DIAGNOSIS — K219 Gastro-esophageal reflux disease without esophagitis: Secondary | ICD-10-CM | POA: Diagnosis not present

## 2016-03-22 DIAGNOSIS — H269 Unspecified cataract: Secondary | ICD-10-CM | POA: Diagnosis not present

## 2016-03-22 DIAGNOSIS — F418 Other specified anxiety disorders: Secondary | ICD-10-CM | POA: Diagnosis not present

## 2016-03-22 HISTORY — PX: CATARACT EXTRACTION W/PHACO: SHX586

## 2016-03-22 SURGERY — PHACOEMULSIFICATION, CATARACT, WITH IOL INSERTION
Anesthesia: Monitor Anesthesia Care | Site: Eye | Laterality: Right

## 2016-03-22 MED ORDER — MIDAZOLAM HCL 2 MG/2ML IJ SOLN
1.0000 mg | INTRAMUSCULAR | Status: DC | PRN
  Administered 2016-03-22 (×2): 2 mg via INTRAVENOUS
  Filled 2016-03-22: qty 2

## 2016-03-22 MED ORDER — TETRACAINE HCL 0.5 % OP SOLN
1.0000 [drp] | OPHTHALMIC | Status: AC
  Administered 2016-03-22 (×3): 1 [drp] via OPHTHALMIC

## 2016-03-22 MED ORDER — EPHEDRINE SULFATE 50 MG/ML IJ SOLN
INTRAMUSCULAR | Status: AC
  Filled 2016-03-22: qty 1

## 2016-03-22 MED ORDER — METOPROLOL TARTRATE 5 MG/5ML IV SOLN
INTRAVENOUS | Status: AC
  Filled 2016-03-22: qty 5

## 2016-03-22 MED ORDER — NEOMYCIN-POLYMYXIN-DEXAMETH 3.5-10000-0.1 OP SUSP
OPHTHALMIC | Status: DC | PRN
  Administered 2016-03-22: 2 [drp] via OPHTHALMIC

## 2016-03-22 MED ORDER — MIDAZOLAM HCL 2 MG/2ML IJ SOLN
INTRAMUSCULAR | Status: DC | PRN
  Administered 2016-03-22: 2 mg via INTRAVENOUS

## 2016-03-22 MED ORDER — MIDAZOLAM HCL 2 MG/2ML IJ SOLN
INTRAMUSCULAR | Status: AC
  Filled 2016-03-22: qty 2

## 2016-03-22 MED ORDER — FENTANYL CITRATE (PF) 100 MCG/2ML IJ SOLN
25.0000 ug | INTRAMUSCULAR | Status: AC
  Administered 2016-03-22 (×2): 25 ug via INTRAVENOUS

## 2016-03-22 MED ORDER — FENTANYL CITRATE (PF) 100 MCG/2ML IJ SOLN
INTRAMUSCULAR | Status: AC
  Filled 2016-03-22: qty 2

## 2016-03-22 MED ORDER — LIDOCAINE HCL (PF) 1 % IJ SOLN
INTRAMUSCULAR | Status: DC | PRN
  Administered 2016-03-22: .4 mL

## 2016-03-22 MED ORDER — LACTATED RINGERS IV SOLN
INTRAVENOUS | Status: DC
  Administered 2016-03-22: 09:00:00 via INTRAVENOUS

## 2016-03-22 MED ORDER — POVIDONE-IODINE 5 % OP SOLN
OPHTHALMIC | Status: DC | PRN
  Administered 2016-03-22: 1 via OPHTHALMIC

## 2016-03-22 MED ORDER — LIDOCAINE HCL (PF) 1 % IJ SOLN
INTRAMUSCULAR | Status: AC
  Filled 2016-03-22: qty 5

## 2016-03-22 MED ORDER — BSS IO SOLN
INTRAOCULAR | Status: DC | PRN
  Administered 2016-03-22: 15 mL

## 2016-03-22 MED ORDER — EPINEPHRINE HCL 1 MG/ML IJ SOLN
INTRAMUSCULAR | Status: AC
  Filled 2016-03-22: qty 1

## 2016-03-22 MED ORDER — CYCLOPENTOLATE-PHENYLEPHRINE 0.2-1 % OP SOLN
1.0000 [drp] | OPHTHALMIC | Status: AC
  Administered 2016-03-22 (×3): 1 [drp] via OPHTHALMIC

## 2016-03-22 MED ORDER — LIDOCAINE 3.5 % OP GEL OPTIME - NO CHARGE
OPHTHALMIC | Status: DC | PRN
  Administered 2016-03-22: 1 [drp] via OPHTHALMIC

## 2016-03-22 MED ORDER — SODIUM CHLORIDE 0.9 % IJ SOLN
INTRAMUSCULAR | Status: AC
  Filled 2016-03-22: qty 20

## 2016-03-22 MED ORDER — PHENYLEPHRINE HCL 2.5 % OP SOLN
1.0000 [drp] | OPHTHALMIC | Status: AC
  Administered 2016-03-22 (×3): 1 [drp] via OPHTHALMIC

## 2016-03-22 MED ORDER — PROVISC 10 MG/ML IO SOLN
INTRAOCULAR | Status: DC | PRN
  Administered 2016-03-22: 0.85 mL via INTRAOCULAR

## 2016-03-22 MED ORDER — EPINEPHRINE HCL 1 MG/ML IJ SOLN
INTRAOCULAR | Status: DC | PRN
  Administered 2016-03-22: 500 mL

## 2016-03-22 MED ORDER — SUCCINYLCHOLINE CHLORIDE 20 MG/ML IJ SOLN
INTRAMUSCULAR | Status: AC
  Filled 2016-03-22: qty 1

## 2016-03-22 MED ORDER — LIDOCAINE HCL 3.5 % OP GEL: 1.0000 "application " | Freq: Once | OPHTHALMIC | Status: DC

## 2016-03-22 SURGICAL SUPPLY — 23 items
CAPSULAR TENSION RING-AMO (OPHTHALMIC RELATED) IMPLANT
CLOTH BEACON ORANGE TIMEOUT ST (SAFETY) ×3 IMPLANT
EYE SHIELD UNIVERSAL CLEAR (GAUZE/BANDAGES/DRESSINGS) ×3 IMPLANT
GLOVE BIOGEL PI IND STRL 7.0 (GLOVE) ×1 IMPLANT
GLOVE BIOGEL PI IND STRL 7.5 (GLOVE) IMPLANT
GLOVE BIOGEL PI INDICATOR 7.0 (GLOVE) ×2
GLOVE BIOGEL PI INDICATOR 7.5 (GLOVE)
GLOVE EXAM NITRILE LRG STRL (GLOVE) IMPLANT
GLOVE EXAM NITRILE MD LF STRL (GLOVE) ×3 IMPLANT
KIT VITRECTOMY (OPHTHALMIC RELATED) IMPLANT
PAD ARMBOARD 7.5X6 YLW CONV (MISCELLANEOUS) ×3 IMPLANT
PROC W NO LENS (INTRAOCULAR LENS)
PROC W SPEC LENS (INTRAOCULAR LENS)
PROCESS W NO LENS (INTRAOCULAR LENS) IMPLANT
PROCESS W SPEC LENS (INTRAOCULAR LENS) IMPLANT
RETRACTOR IRIS SIGHTPATH (OPHTHALMIC RELATED) IMPLANT
RING MALYGIN (MISCELLANEOUS) IMPLANT
SIGHTPATH CAT PROC W REG LENS (Ophthalmic Related) ×3 IMPLANT
SYRINGE LUER LOK 1CC (MISCELLANEOUS) ×3 IMPLANT
TAPE SURG TRANSPORE 1 IN (GAUZE/BANDAGES/DRESSINGS) ×1 IMPLANT
TAPE SURGICAL TRANSPORE 1 IN (GAUZE/BANDAGES/DRESSINGS) ×2
VISCOELASTIC ADDITIONAL (OPHTHALMIC RELATED) IMPLANT
WATER STERILE IRR 250ML POUR (IV SOLUTION) ×3 IMPLANT

## 2016-03-22 NOTE — Anesthesia Procedure Notes (Signed)
Procedure Name: MAC Date/Time: 03/22/2016 10:04 AM Performed by: Vista Deck Pre-anesthesia Checklist: Patient identified, Emergency Drugs available, Suction available, Timeout performed and Patient being monitored Patient Re-evaluated:Patient Re-evaluated prior to inductionOxygen Delivery Method: Nasal Cannula

## 2016-03-22 NOTE — Anesthesia Preprocedure Evaluation (Signed)
Anesthesia Evaluation  Patient identified by MRN, date of birth, ID band Patient awake    Reviewed: Allergy & Precautions, NPO status , Patient's Chart, lab work & pertinent test results  History of Anesthesia Complications (+) PONV and history of anesthetic complications  Airway Mallampati: II  TM Distance: >3 FB     Dental  (+) Teeth Intact, Implants   Pulmonary neg pulmonary ROS,    breath sounds clear to auscultation       Cardiovascular negative cardio ROS   Rhythm:Regular Rate:Normal     Neuro/Psych PSYCHIATRIC DISORDERS Anxiety Depression    GI/Hepatic GERD  Medicated and Controlled,  Endo/Other  Hypothyroidism   Renal/GU      Musculoskeletal  (+) Fibromyalgia -  Abdominal   Peds  Hematology   Anesthesia Other Findings   Reproductive/Obstetrics                             Anesthesia Physical Anesthesia Plan  ASA: II  Anesthesia Plan: MAC   Post-op Pain Management:    Induction: Intravenous  Airway Management Planned: Nasal Cannula  Additional Equipment:   Intra-op Plan:   Post-operative Plan:   Informed Consent: I have reviewed the patients History and Physical, chart, labs and discussed the procedure including the risks, benefits and alternatives for the proposed anesthesia with the patient or authorized representative who has indicated his/her understanding and acceptance.     Plan Discussed with:   Anesthesia Plan Comments:         Anesthesia Quick Evaluation

## 2016-03-22 NOTE — Transfer of Care (Signed)
Immediate Anesthesia Transfer of Care Note  Patient: [name]  Procedure(s) Performed: Procedure(s) (LRB): CATARACT EXTRACTION PHACO AND INTRAOCULAR LENS PLACEMENT (IOC) (Right)  Patient Location: Shortstay  Anesthesia Type: MAC  Level of Consciousness: awake  Airway & Oxygen Therapy: Patient Spontanous Breathing   Post-op Assessment: Report given to PACU RN, Post -op Vital signs reviewed and stable and Patient moving all extremities  Post vital signs: Reviewed and stable  Complications: No apparent anesthesia complications

## 2016-03-22 NOTE — Anesthesia Postprocedure Evaluation (Signed)
  Anesthesia Post-op Note  Patient: [name]  Procedure(s) Performed: Procedure(s) (LRB): CATARACT EXTRACTION PHACO AND INTRAOCULAR LENS PLACEMENT (IOC) (Right)  Patient Location:  Short Stay  Anesthesia Type: MAC  Level of Consciousness: awake  Airway and Oxygen Therapy: Patient Spontanous Breathing  Post-op Pain: none  Post-op Assessment: Post-op Vital signs reviewed, Patient's Cardiovascular Status Stable, Respiratory Function Stable, Patent Airway, No signs of Nausea or vomiting and Pain level controlled  Post-op Vital Signs: Reviewed and stable  Complications: No apparent anesthesia complications

## 2016-03-22 NOTE — H&P (Signed)
I have reviewed the H&P, the patient was re-examined, and I have identified no interval changes in medical condition and plan of care since the history and physical of record  

## 2016-03-22 NOTE — Discharge Instructions (Signed)
Cataract Surgery, Care After °Refer to this sheet in the next few weeks. These instructions provide you with information on caring for yourself after your procedure. Your caregiver may also give you more specific instructions. Your treatment has been planned according to current medical practices, but problems sometimes occur. Call your caregiver if you have any problems or questions after your procedure.  °HOME CARE INSTRUCTIONS  °· Avoid strenuous activities as directed by your caregiver. °· Ask your caregiver when you can resume driving. °· Use eyedrops or other medicines to help healing and control pressure inside your eye as directed by your caregiver. °· Only take over-the-counter or prescription medicines for pain, discomfort, or fever as directed by your caregiver. °· Do not to touch or rub your eyes. °· You may be instructed to use a protective shield during the first few days and nights after surgery. If not, wear sunglasses to protect your eyes. This is to protect the eye from pressure or from being accidentally bumped. °· Keep the area around your eye clean and dry. Avoid swimming or allowing water to hit you directly in the face while showering. Keep soap and shampoo out of your eyes. °· Do not bend or lift heavy objects. Bending increases pressure in the eye. You can walk, climb stairs, and do light household chores. °· Do not put a contact lens into the eye that had surgery until your caregiver says it is okay to do so. °· Ask your doctor when you can return to work. This will depend on the kind of work that you do. If you work in a dusty environment, you may be advised to wear protective eyewear for a period of time. °· Ask your caregiver when it will be safe to engage in sexual activity. °· Continue with your regular eye exams as directed by your caregiver. °What to expect: °· It is normal to feel itching and mild discomfort for a few days after cataract surgery. Some fluid discharge is also common,  and your eye may be sensitive to light and touch. °· After 1 to 2 days, even moderate discomfort should disappear. In most cases, healing will take about 6 weeks. °· If you received an intraocular lens (IOL), you may notice that colors are very bright or have a blue tinge. Also, if you have been in bright sunlight, everything may appear reddish for a few hours. If you see these color tinges, it is because your lens is clear and no longer cloudy. Within a few months after receiving an IOL, these extra colors should go away. When you have healed, you will probably need new glasses. °SEEK MEDICAL CARE IF:  °· You have increased bruising around your eye. °· You have discomfort not helped by medicine. °SEEK IMMEDIATE MEDICAL CARE IF:  °· You have a  fever. °· You have a worsening or sudden vision loss. °· You have redness, swelling, or increasing pain in the eye. °· You have a thick discharge from the eye that had surgery. °MAKE SURE YOU: °· Understand these instructions. °· Will watch your condition. °· Will get help right away if you are not doing well or get worse. °  °This information is not intended to replace advice given to you by your health care provider. Make sure you discuss any questions you have with your health care provider. °  °Document Released: 06/01/2005 Document Revised: 12/03/2014 Document Reviewed: 07/06/2011 °Elsevier Interactive Patient Education ©2016 [employer] ° °

## 2016-03-22 NOTE — Op Note (Signed)
Date of Admission: 03/22/2016  Date of Surgery: 03/22/2016   Pre-Op Dx: Cataract Right Eye  Post-Op Dx: Senile Nuclear Cataract Right  Eye,  Dx Code H25.11  Surgeon: Tonny Branch, M.D.  Assistants: None  Anesthesia: Topical with MAC  Indications: Painless, progressive loss of vision with compromise of daily activities.  Surgery: Cataract Extraction with Intraocular lens Implant Right Eye  Discription: The patient had dilating drops and viscous lidocaine placed into the Right eye in the pre-op holding area. After transfer to the operating room, a time out was performed. The patient was then prepped and draped. Beginning with a 34 degree blade a paracentesis port was made at the surgeon's 2 o'clock position. The anterior chamber was then filled with 1% non-preserved lidocaine. This was followed by filling the anterior chamber with Provisc.  A 2.65mm keratome blade was used to make a clear corneal incision at the temporal limbus.  A bent cystatome needle was used to create a continuous tear capsulotomy. Hydrodissection was performed with balanced salt solution on a Fine canula. The lens nucleus was then removed using the phacoemulsification handpiece. Residual cortex was removed with the I&A handpiece. The anterior chamber and capsular bag were refilled with Provisc. A posterior chamber intraocular lens was placed into the capsular bag with it's injector. The implant was positioned with the Kuglan hook. The Provisc was then removed from the anterior chamber and capsular bag with the I&A handpiece. Stromal hydration of the main incision and paracentesis port was performed with BSS on a Fine canula. The wounds were tested for leak which was negative. The patient tolerated the procedure well. There were no operative complications. The patient was then transferred to the recovery room in stable condition.  Complications: None  Specimen: None  EBL: None  Prosthetic device: Hoya iSert 250, power 24.5 D,  SN [id number].

## 2016-03-23 ENCOUNTER — Encounter (HOSPITAL_COMMUNITY): Payer: Self-pay | Admitting: Ophthalmology

## 2016-04-09 DIAGNOSIS — H2512 Age-related nuclear cataract, left eye: Secondary | ICD-10-CM | POA: Diagnosis not present

## 2016-04-09 DIAGNOSIS — H2510 Age-related nuclear cataract, unspecified eye: Secondary | ICD-10-CM | POA: Diagnosis not present

## 2016-04-13 DIAGNOSIS — H1045 Other chronic allergic conjunctivitis: Secondary | ICD-10-CM | POA: Diagnosis not present

## 2016-04-17 ENCOUNTER — Encounter (HOSPITAL_COMMUNITY)
Admission: RE | Admit: 2016-04-17 | Discharge: 2016-04-17 | Disposition: A | Discharge status: Home / Self Care | Payer: Medicare Other | Source: Ambulatory Visit | Attending: Ophthalmology | Admitting: Ophthalmology

## 2016-04-19 ENCOUNTER — Encounter (HOSPITAL_COMMUNITY): Payer: Self-pay

## 2016-04-19 ENCOUNTER — Ambulatory Visit (HOSPITAL_COMMUNITY)
Admission: RE | Admit: 2016-04-19 | Discharge: 2016-04-19 | Disposition: A | Discharge status: Home / Self Care | Payer: Medicare Other | Source: Ambulatory Visit | Attending: Ophthalmology | Admitting: Ophthalmology

## 2016-04-19 ENCOUNTER — Encounter (HOSPITAL_COMMUNITY)
Admission: RE | Disposition: A | Discharge status: Home / Self Care | Payer: Self-pay | Source: Ambulatory Visit | Attending: Ophthalmology

## 2016-04-19 ENCOUNTER — Ambulatory Visit (HOSPITAL_COMMUNITY): Payer: Medicare Other | Admitting: Anesthesiology

## 2016-04-19 DIAGNOSIS — Z79899 Other long term (current) drug therapy: Secondary | ICD-10-CM | POA: Insufficient documentation

## 2016-04-19 DIAGNOSIS — Z7982 Long term (current) use of aspirin: Secondary | ICD-10-CM | POA: Diagnosis not present

## 2016-04-19 DIAGNOSIS — E039 Hypothyroidism, unspecified: Secondary | ICD-10-CM | POA: Diagnosis not present

## 2016-04-19 DIAGNOSIS — M797 Fibromyalgia: Secondary | ICD-10-CM | POA: Diagnosis not present

## 2016-04-19 DIAGNOSIS — F418 Other specified anxiety disorders: Secondary | ICD-10-CM | POA: Insufficient documentation

## 2016-04-19 DIAGNOSIS — K219 Gastro-esophageal reflux disease without esophagitis: Secondary | ICD-10-CM | POA: Diagnosis not present

## 2016-04-19 DIAGNOSIS — H269 Unspecified cataract: Secondary | ICD-10-CM | POA: Diagnosis not present

## 2016-04-19 DIAGNOSIS — H2512 Age-related nuclear cataract, left eye: Secondary | ICD-10-CM | POA: Insufficient documentation

## 2016-04-19 HISTORY — PX: CATARACT EXTRACTION W/PHACO: SHX586

## 2016-04-19 SURGERY — PHACOEMULSIFICATION, CATARACT, WITH IOL INSERTION
Anesthesia: Monitor Anesthesia Care | Site: Eye | Laterality: Left

## 2016-04-19 MED ORDER — POVIDONE-IODINE 5 % OP SOLN
OPHTHALMIC | Status: DC | PRN
  Administered 2016-04-19: 1 via OPHTHALMIC

## 2016-04-19 MED ORDER — NEOMYCIN-POLYMYXIN-DEXAMETH 3.5-10000-0.1 OP SUSP
OPHTHALMIC | Status: DC | PRN
  Administered 2016-04-19: 2 [drp] via OPHTHALMIC

## 2016-04-19 MED ORDER — TETRACAINE HCL 0.5 % OP SOLN
1.0000 [drp] | OPHTHALMIC | Status: AC
  Administered 2016-04-19 (×3): 1 [drp] via OPHTHALMIC

## 2016-04-19 MED ORDER — FENTANYL CITRATE (PF) 100 MCG/2ML IJ SOLN
INTRAMUSCULAR | Status: AC
  Filled 2016-04-19: qty 2

## 2016-04-19 MED ORDER — MIDAZOLAM HCL 5 MG/5ML IJ SOLN
INTRAMUSCULAR | Status: DC | PRN
  Administered 2016-04-19: 2 mg via INTRAVENOUS

## 2016-04-19 MED ORDER — ONDANSETRON HCL 4 MG/2ML IJ SOLN
INTRAMUSCULAR | Status: AC
  Filled 2016-04-19: qty 2

## 2016-04-19 MED ORDER — FENTANYL CITRATE (PF) 100 MCG/2ML IJ SOLN
25.0000 ug | INTRAMUSCULAR | Status: AC
  Administered 2016-04-19 (×2): 25 ug via INTRAVENOUS

## 2016-04-19 MED ORDER — LIDOCAINE HCL 3.5 % OP GEL
1.0000 "application " | Freq: Once | OPHTHALMIC | Status: AC
  Administered 2016-04-19: 1 via OPHTHALMIC

## 2016-04-19 MED ORDER — EPINEPHRINE HCL 1 MG/ML IJ SOLN
INTRAMUSCULAR | Status: AC
  Filled 2016-04-19: qty 1

## 2016-04-19 MED ORDER — MIDAZOLAM HCL 2 MG/2ML IJ SOLN
INTRAMUSCULAR | Status: AC
  Filled 2016-04-19: qty 2

## 2016-04-19 MED ORDER — PHENYLEPHRINE HCL 2.5 % OP SOLN
1.0000 [drp] | OPHTHALMIC | Status: AC
  Administered 2016-04-19 (×3): 1 [drp] via OPHTHALMIC

## 2016-04-19 MED ORDER — LACTATED RINGERS IV SOLN
INTRAVENOUS | Status: DC
  Administered 2016-04-19: 09:00:00 via INTRAVENOUS

## 2016-04-19 MED ORDER — CYCLOPENTOLATE-PHENYLEPHRINE 0.2-1 % OP SOLN
1.0000 [drp] | OPHTHALMIC | Status: AC
  Administered 2016-04-19 (×3): 1 [drp] via OPHTHALMIC

## 2016-04-19 MED ORDER — MIDAZOLAM HCL 2 MG/2ML IJ SOLN
1.0000 mg | INTRAMUSCULAR | Status: DC | PRN
Start: 2016-04-19 — End: 2016-04-21
  Administered 2016-04-19: 2 mg via INTRAVENOUS
  Filled 2016-04-19: qty 2

## 2016-04-19 MED ORDER — EPINEPHRINE HCL 1 MG/ML IJ SOLN
INTRAOCULAR | Status: DC | PRN
  Administered 2016-04-19: 500 mL

## 2016-04-19 MED ORDER — LIDOCAINE HCL (PF) 1 % IJ SOLN
INTRAMUSCULAR | Status: DC | PRN
  Administered 2016-04-19: .7 mL

## 2016-04-19 MED ORDER — DEXAMETHASONE SODIUM PHOSPHATE 4 MG/ML IJ SOLN
INTRAMUSCULAR | Status: AC
  Filled 2016-04-19: qty 1

## 2016-04-19 MED ORDER — BSS IO SOLN
INTRAOCULAR | Status: DC | PRN
  Administered 2016-04-19: 15 mL

## 2016-04-19 MED ORDER — PROVISC 10 MG/ML IO SOLN
INTRAOCULAR | Status: DC | PRN
  Administered 2016-04-19: 0.85 mL via INTRAOCULAR

## 2016-04-19 MED ORDER — ONDANSETRON HCL 4 MG/2ML IJ SOLN
4.0000 mg | Freq: Once | INTRAMUSCULAR | Status: AC
  Administered 2016-04-19: 4 mg via INTRAVENOUS

## 2016-04-19 MED ORDER — GLYCOPYRROLATE 0.2 MG/ML IJ SOLN
INTRAMUSCULAR | Status: AC
Start: 2016-04-19 — End: 2016-04-19
  Filled 2016-04-19: qty 1

## 2016-04-19 MED ORDER — DEXAMETHASONE SODIUM PHOSPHATE 4 MG/ML IJ SOLN
4.0000 mg | Freq: Once | INTRAMUSCULAR | Status: AC
  Administered 2016-04-19: 4 mg via INTRAVENOUS

## 2016-04-19 SURGICAL SUPPLY — 23 items
CAPSULAR TENSION RING-AMO (OPHTHALMIC RELATED) IMPLANT
CLOTH BEACON ORANGE TIMEOUT ST (SAFETY) ×3 IMPLANT
EYE SHIELD UNIVERSAL CLEAR (GAUZE/BANDAGES/DRESSINGS) ×3 IMPLANT
GLOVE BIOGEL PI IND STRL 7.0 (GLOVE) ×2 IMPLANT
GLOVE BIOGEL PI IND STRL 7.5 (GLOVE) IMPLANT
GLOVE BIOGEL PI INDICATOR 7.0 (GLOVE) ×4
GLOVE BIOGEL PI INDICATOR 7.5 (GLOVE)
GLOVE EXAM NITRILE LRG STRL (GLOVE) IMPLANT
GLOVE EXAM NITRILE MD LF STRL (GLOVE) IMPLANT
KIT VITRECTOMY (OPHTHALMIC RELATED) IMPLANT
PAD ARMBOARD 7.5X6 YLW CONV (MISCELLANEOUS) ×3 IMPLANT
PROC W NO LENS (INTRAOCULAR LENS)
PROC W SPEC LENS (INTRAOCULAR LENS)
PROCESS W NO LENS (INTRAOCULAR LENS) IMPLANT
PROCESS W SPEC LENS (INTRAOCULAR LENS) IMPLANT
RETRACTOR IRIS SIGHTPATH (OPHTHALMIC RELATED) IMPLANT
RING MALYGIN (MISCELLANEOUS) IMPLANT
SIGHTPATH CAT PROC W REG LENS (Ophthalmic Related) ×3 IMPLANT
SYRINGE LUER LOK 1CC (MISCELLANEOUS) ×3 IMPLANT
TAPE SURG TRANSPORE 1 IN (GAUZE/BANDAGES/DRESSINGS) ×1 IMPLANT
TAPE SURGICAL TRANSPORE 1 IN (GAUZE/BANDAGES/DRESSINGS) ×2
VISCOELASTIC ADDITIONAL (OPHTHALMIC RELATED) IMPLANT
WATER STERILE IRR 250ML POUR (IV SOLUTION) ×3 IMPLANT

## 2016-04-19 NOTE — Anesthesia Procedure Notes (Signed)
Procedure Name: MAC Date/Time: 04/19/2016 9:41 AM Performed by: Andree Elk, AMY A Pre-anesthesia Checklist: Patient identified, Emergency Drugs available, Suction available, Timeout performed and Patient being monitored Patient Re-evaluated:Patient Re-evaluated prior to inductionOxygen Delivery Method: Nasal Cannula

## 2016-04-19 NOTE — Op Note (Signed)
Date of Admission: 04/19/2016  Date of Surgery: 04/19/2016   Pre-Op Dx: Cataract Left Eye  Post-Op Dx: Senile Nuclear Cataract Left  Eye,  Dx Code H25.12  Surgeon: Tonny Branch, M.D.  Assistants: None  Anesthesia: Topical with MAC  Indications: Painless, progressive loss of vision with compromise of daily activities.  Surgery: Cataract Extraction with Intraocular lens Implant Left Eye  Discription: The patient had dilating drops and viscous lidocaine placed into the Left eye in the pre-op holding area. After transfer to the operating room, a time out was performed. The patient was then prepped and draped. Beginning with a 15 degree blade a paracentesis port was made at the surgeon's 2 o'clock position. The anterior chamber was then filled with 1% non-preserved lidocaine. This was followed by filling the anterior chamber with Provisc.  A 2.88mm keratome blade was used to make a clear corneal incision at the temporal limbus.  A bent cystatome needle was used to create a continuous tear capsulotomy. Hydrodissection was performed with balanced salt solution on a Fine canula. The lens nucleus was then removed using the phacoemulsification handpiece. Residual cortex was removed with the I&A handpiece. The anterior chamber and capsular bag were refilled with Provisc. A posterior chamber intraocular lens was placed into the capsular bag with it's injector. The implant was positioned with the Kuglan hook. The Provisc was then removed from the anterior chamber and capsular bag with the I&A handpiece. Stromal hydration of the main incision and paracentesis port was performed with BSS on a Fine canula. The wounds were tested for leak which was negative. The patient tolerated the procedure well. There were no operative complications. The patient was then transferred to the recovery room in stable condition.  Complications: None  Specimen: None  EBL: None  Prosthetic device: Hoya iSert 250, power 21.5 D, SN  [id number].

## 2016-04-19 NOTE — Anesthesia Postprocedure Evaluation (Signed)
Anesthesia Post Note  Patient: [name]  Procedure(s) Performed: Procedure(s) (LRB): CATARACT EXTRACTION PHACO AND INTRAOCULAR LENS PLACEMENT (IOC) (Left)  Patient location during evaluation: Short Stay Anesthesia Type: MAC Level of consciousness: awake and alert and oriented Pain management: pain level controlled Vital Signs Assessment: post-procedure vital signs reviewed and stable Respiratory status: spontaneous breathing Cardiovascular status: stable Postop Assessment: no signs of nausea or vomiting Anesthetic complications: no    Last Vitals:  Filed Vitals:   04/19/16 0935 04/19/16 0940  BP: 114/66   Pulse:    Temp:    Resp: 21 18    Last Pain: There were no vitals filed for this visit.               ADAMS, AMY A

## 2016-04-19 NOTE — Transfer of Care (Signed)
Immediate Anesthesia Transfer of Care Note  Patient: [name]  Procedure(s) Performed: Procedure(s) with comments: CATARACT EXTRACTION PHACO AND INTRAOCULAR LENS PLACEMENT (IOC) (Left) - CDE 5.05  Patient Location: Short Stay  Anesthesia Type:MAC  Level of Consciousness: awake, alert  and oriented  Airway & Oxygen Therapy: Patient Spontanous Breathing  Post-op Assessment: Report given to RN and Post -op Vital signs reviewed and stable  Post vital signs: Reviewed and stable  Last Vitals:  Filed Vitals:   04/19/16 0935 04/19/16 0940  BP: 114/66   Pulse:    Temp:    Resp: 21 18    Last Pain: There were no vitals filed for this visit.    Patients Stated Pain Goal: 8 ([id number])  Complications: No apparent anesthesia complications

## 2016-04-19 NOTE — Anesthesia Preprocedure Evaluation (Signed)
Anesthesia Evaluation  Patient identified by MRN, date of birth, ID band Patient awake    Reviewed: Allergy & Precautions, NPO status , Patient's Chart, lab work & pertinent test results  History of Anesthesia Complications (+) PONV and history of anesthetic complications  Airway Mallampati: II  TM Distance: >3 FB     Dental  (+) Teeth Intact, Implants   Pulmonary neg pulmonary ROS,    breath sounds clear to auscultation       Cardiovascular negative cardio ROS   Rhythm:Regular Rate:Normal     Neuro/Psych PSYCHIATRIC DISORDERS Anxiety Depression    GI/Hepatic GERD  Medicated and Controlled,  Endo/Other  Hypothyroidism   Renal/GU      Musculoskeletal  (+) Fibromyalgia -  Abdominal   Peds  Hematology   Anesthesia Other Findings   Reproductive/Obstetrics                             Anesthesia Physical Anesthesia Plan  ASA: II  Anesthesia Plan: MAC   Post-op Pain Management:    Induction: Intravenous  Airway Management Planned: Nasal Cannula  Additional Equipment:   Intra-op Plan:   Post-operative Plan:   Informed Consent: I have reviewed the patients History and Physical, chart, labs and discussed the procedure including the risks, benefits and alternatives for the proposed anesthesia with the patient or authorized representative who has indicated his/her understanding and acceptance.     Plan Discussed with:   Anesthesia Plan Comments:         Anesthesia Quick Evaluation

## 2016-04-19 NOTE — H&P (Signed)
I have reviewed the H&P, the patient was re-examined, and I have identified no interval changes in medical condition and plan of care since the history and physical of record  

## 2016-04-19 NOTE — Discharge Instructions (Signed)

## 2016-04-20 ENCOUNTER — Encounter (HOSPITAL_COMMUNITY): Payer: Self-pay | Admitting: Ophthalmology

## 2016-06-14 DIAGNOSIS — M25551 Pain in right hip: Secondary | ICD-10-CM | POA: Diagnosis not present

## 2016-06-14 DIAGNOSIS — M545 Low back pain: Secondary | ICD-10-CM | POA: Diagnosis not present

## 2016-06-14 DIAGNOSIS — K210 Gastro-esophageal reflux disease with esophagitis: Secondary | ICD-10-CM | POA: Diagnosis not present

## 2016-06-14 DIAGNOSIS — K3184 Gastroparesis: Secondary | ICD-10-CM | POA: Diagnosis not present

## 2016-06-25 DIAGNOSIS — H26493 Other secondary cataract, bilateral: Secondary | ICD-10-CM | POA: Diagnosis not present

## 2016-06-25 DIAGNOSIS — H26491 Other secondary cataract, right eye: Secondary | ICD-10-CM | POA: Diagnosis not present

## 2016-06-25 DIAGNOSIS — H353130 Nonexudative age-related macular degeneration, bilateral, stage unspecified: Secondary | ICD-10-CM | POA: Diagnosis not present

## 2016-06-25 DIAGNOSIS — H26492 Other secondary cataract, left eye: Secondary | ICD-10-CM | POA: Diagnosis not present

## 2016-06-27 DIAGNOSIS — M545 Low back pain: Secondary | ICD-10-CM | POA: Diagnosis not present

## 2016-06-27 DIAGNOSIS — M25551 Pain in right hip: Secondary | ICD-10-CM | POA: Diagnosis not present

## 2016-06-27 DIAGNOSIS — M25552 Pain in left hip: Secondary | ICD-10-CM | POA: Diagnosis not present

## 2016-06-27 DIAGNOSIS — K210 Gastro-esophageal reflux disease with esophagitis: Secondary | ICD-10-CM | POA: Diagnosis not present

## 2016-08-01 ENCOUNTER — Other Ambulatory Visit: Payer: Self-pay

## 2016-08-11 IMAGING — CR DG CHEST 2V
2 series · 2 of 2 positions shown · non-contrast
Comparison: 10/23/2014

CLINICAL DATA: Bronchitis.

EXAM:
CHEST  2 VIEW

[view not recorded (1 of 2)]
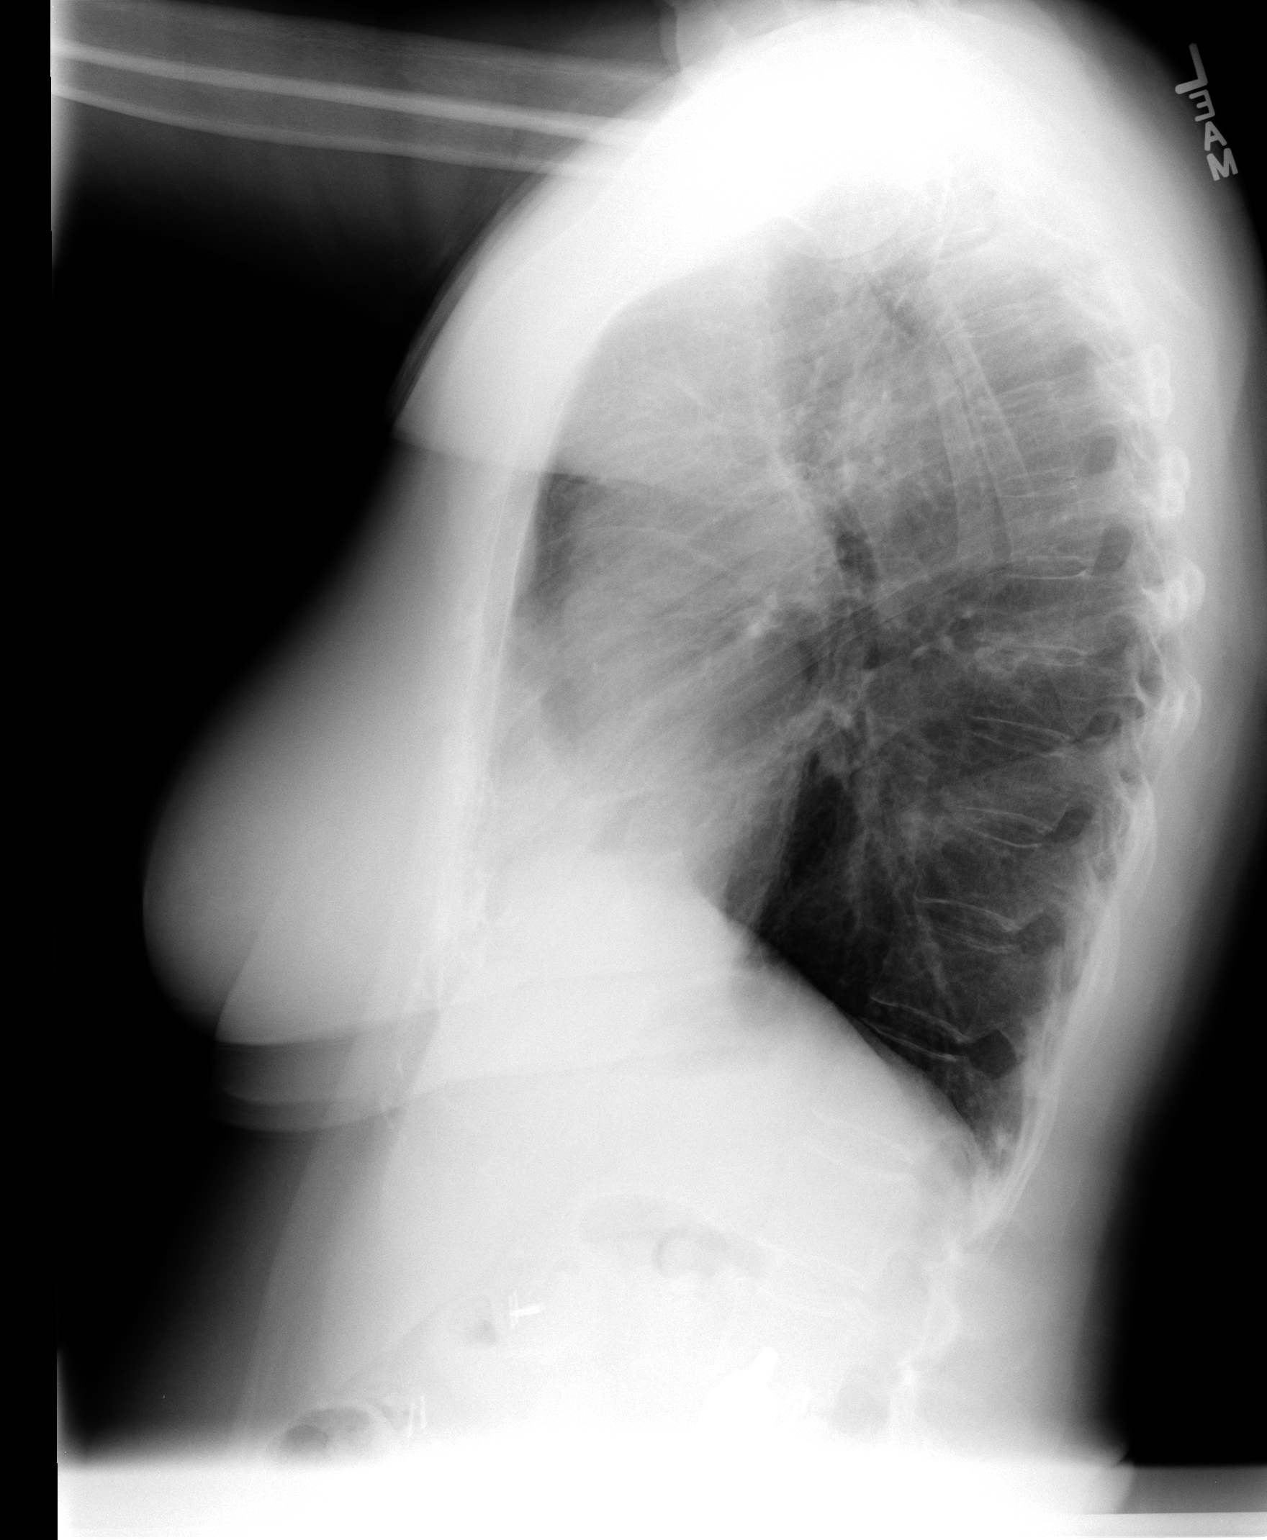

[view not recorded (2 of 2)]
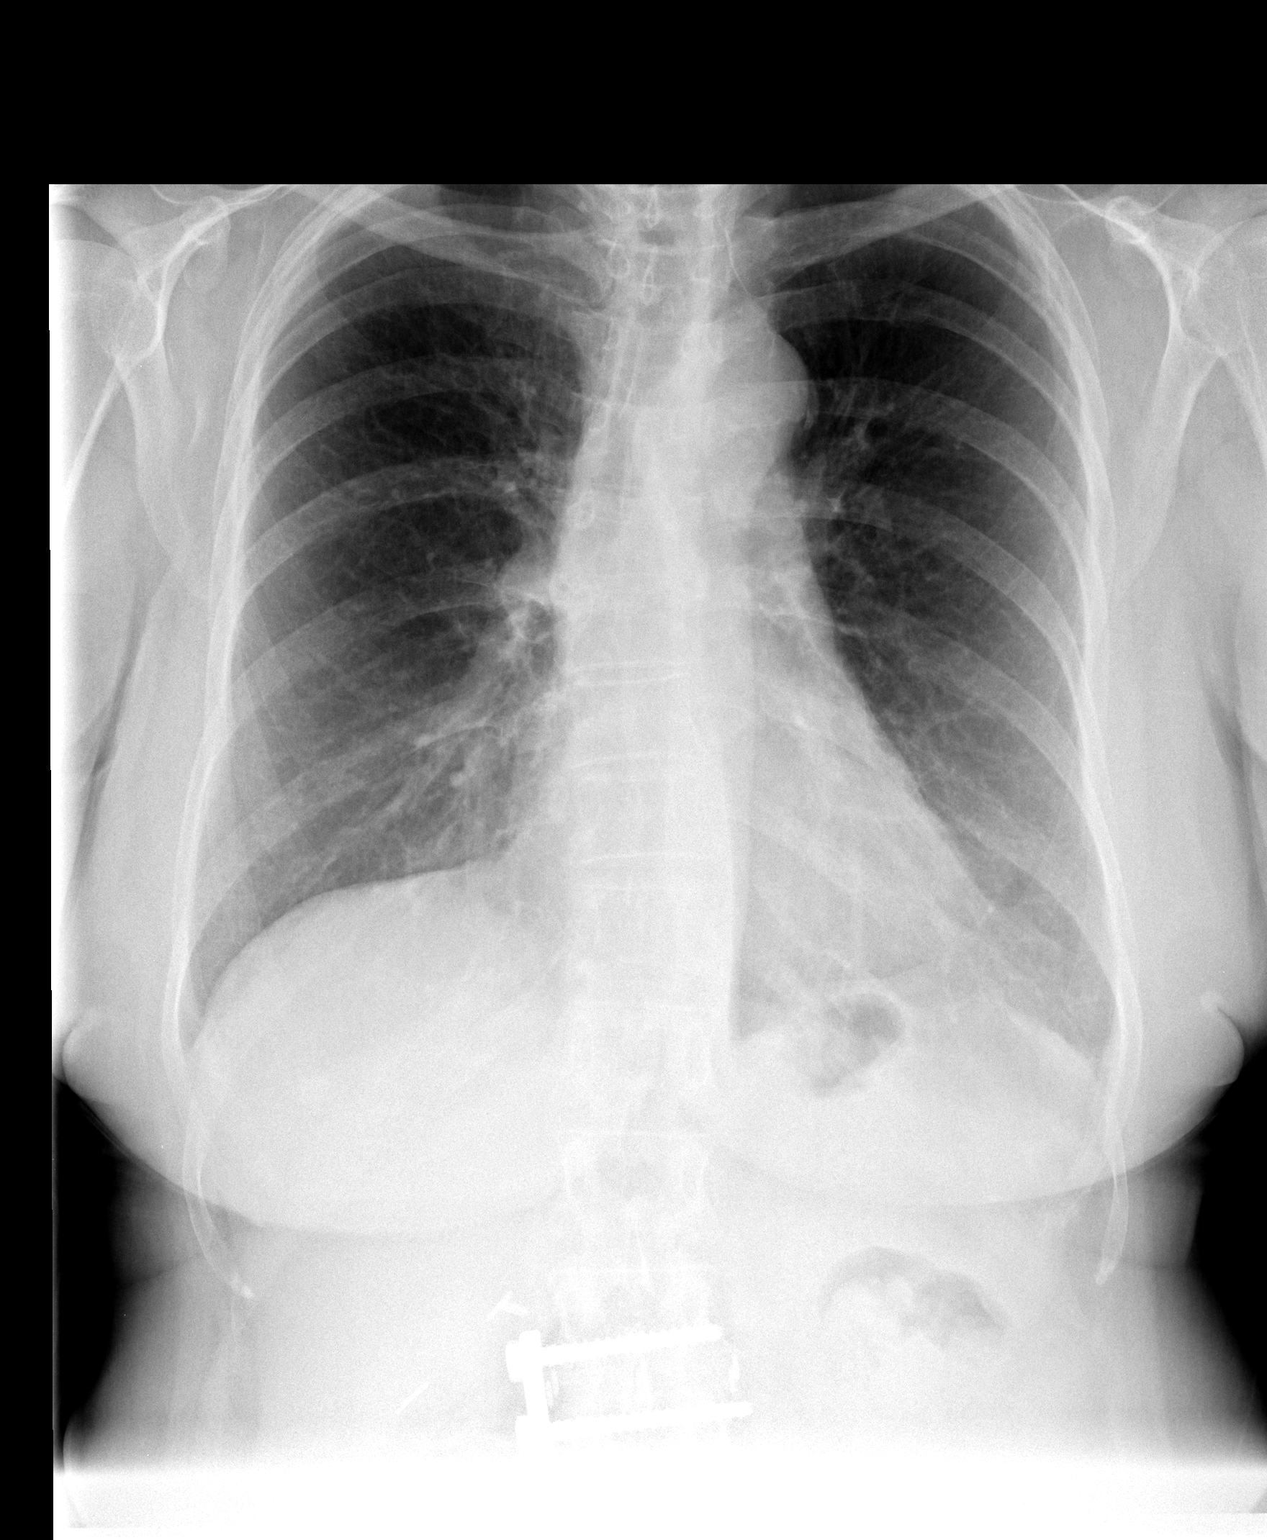

[2 of 2 positions shown; findings below may reference images not displayed]

FINDINGS: There is slight peribronchial thickening. No consolidative
infiltrates or effusions. Heart size and vascularity are normal.
Slight chronic elevation of the right hemidiaphragm. Slight chronic
thoracic scoliosis. No effusions.
IMPRESSION: Chronic bronchitic changes.

## 2016-08-20 DIAGNOSIS — Z23 Encounter for immunization: Secondary | ICD-10-CM | POA: Diagnosis not present

## 2016-09-17 DIAGNOSIS — E739 Lactose intolerance, unspecified: Secondary | ICD-10-CM | POA: Diagnosis not present

## 2016-09-17 DIAGNOSIS — M545 Low back pain: Secondary | ICD-10-CM | POA: Diagnosis not present

## 2016-09-17 DIAGNOSIS — K210 Gastro-esophageal reflux disease with esophagitis: Secondary | ICD-10-CM | POA: Diagnosis not present

## 2016-09-17 DIAGNOSIS — G4700 Insomnia, unspecified: Secondary | ICD-10-CM | POA: Diagnosis not present

## 2016-09-22 DIAGNOSIS — G8929 Other chronic pain: Secondary | ICD-10-CM | POA: Diagnosis not present

## 2016-09-22 DIAGNOSIS — F419 Anxiety disorder, unspecified: Secondary | ICD-10-CM | POA: Diagnosis not present

## 2016-09-22 DIAGNOSIS — K219 Gastro-esophageal reflux disease without esophagitis: Secondary | ICD-10-CM | POA: Diagnosis not present

## 2016-09-22 DIAGNOSIS — E039 Hypothyroidism, unspecified: Secondary | ICD-10-CM | POA: Diagnosis not present

## 2016-09-22 DIAGNOSIS — R030 Elevated blood-pressure reading, without diagnosis of hypertension: Secondary | ICD-10-CM | POA: Diagnosis not present

## 2016-09-22 DIAGNOSIS — R112 Nausea with vomiting, unspecified: Secondary | ICD-10-CM | POA: Diagnosis not present

## 2016-09-22 DIAGNOSIS — M546 Pain in thoracic spine: Secondary | ICD-10-CM | POA: Diagnosis not present

## 2016-09-22 DIAGNOSIS — J302 Other seasonal allergic rhinitis: Secondary | ICD-10-CM | POA: Diagnosis not present

## 2016-09-22 DIAGNOSIS — Z969 Presence of functional implant, unspecified: Secondary | ICD-10-CM | POA: Diagnosis not present

## 2016-09-22 DIAGNOSIS — Z79899 Other long term (current) drug therapy: Secondary | ICD-10-CM | POA: Diagnosis not present

## 2016-09-22 DIAGNOSIS — M1990 Unspecified osteoarthritis, unspecified site: Secondary | ICD-10-CM | POA: Diagnosis not present

## 2016-09-28 ENCOUNTER — Encounter (INDEPENDENT_AMBULATORY_CARE_PROVIDER_SITE_OTHER): Payer: Self-pay | Admitting: Internal Medicine

## 2016-09-28 ENCOUNTER — Encounter (INDEPENDENT_AMBULATORY_CARE_PROVIDER_SITE_OTHER): Payer: Self-pay

## 2016-10-10 ENCOUNTER — Ambulatory Visit (INDEPENDENT_AMBULATORY_CARE_PROVIDER_SITE_OTHER): Payer: Medicare Other | Admitting: Internal Medicine

## 2016-10-10 ENCOUNTER — Encounter (INDEPENDENT_AMBULATORY_CARE_PROVIDER_SITE_OTHER): Payer: Self-pay | Admitting: Internal Medicine

## 2016-10-10 VITALS — BP 100/50 | HR 60 | Temp 97.8°F | Ht 63.0 in | Wt 133.9 lb

## 2016-10-10 DIAGNOSIS — R140 Abdominal distension (gaseous): Secondary | ICD-10-CM

## 2016-10-10 DIAGNOSIS — K219 Gastro-esophageal reflux disease without esophagitis: Secondary | ICD-10-CM

## 2016-10-10 DIAGNOSIS — G8929 Other chronic pain: Secondary | ICD-10-CM | POA: Insufficient documentation

## 2016-10-10 DIAGNOSIS — M549 Dorsalgia, unspecified: Secondary | ICD-10-CM

## 2016-10-10 NOTE — Progress Notes (Signed)
Subjective:    Patient ID: [name], female    DOB: 11/23/1941, 75 y.o.   MRN: [id number]  HPI Presents today with c/o gas. She had been on a Palio diet a couple of months ago which did not help. She stopped the diet.  She says she has bloating. She has been bloating for about 1 1/2 yrs. She has been to the ED for this. She says the gas puts pressure on her back and she has pain.  She tells me her stools are hard. She has a BM once every 4-5 days. She has tried suppositories to get her BMs to move.  She says when the gas build up, she has lower abdominal swelling. Symptoms x 6 months.  She tells me Gas X has not helped.       11/11/2015:   Colonoscopy  Indications:  Patient is 75 year old Caucasian female was evaluated in emergency room bout 4 weeks ago for abdominal pain. Since then she had an episode of rectal bleeding. She also has noted constipation. Family history significant for CRC and sister who died of metastatic disease within few weeks of diagnosis at age 71. His last colonoscopy was in May 2010. Impression:  Examination performed to cecum. Small external hemorrhoids otherwise normal colonoscopy. Colonoscopy in 5 yrs Review of Systems Past Medical History:  Diagnosis Date  . Anxiety   . Chronic back pain   . Chronic sinusitis   . Depression   . Fibromyalgia   . GERD (gastroesophageal reflux disease)   . Glaucoma   . Hypothyroidism   . Macular degeneration   . PONV (postoperative nausea and vomiting)     Past Surgical History:  Procedure Laterality Date  . ABDOMINAL HYSTERECTOMY     1985  . back stimulatory    . BACK SURGERY     plate and screws  . BACK SURGERY     plate in back  . CATARACT EXTRACTION W/PHACO Right 03/22/2016   Procedure: CATARACT EXTRACTION PHACO AND INTRAOCULAR LENS PLACEMENT (IOC);  Surgeon: Tonny Branch, MD;  Location: [facility];  Service: Ophthalmology;  Laterality: Right;  CDE 5.69  . CATARACT EXTRACTION W/PHACO Left 04/19/2016   Procedure: CATARACT EXTRACTION PHACO AND INTRAOCULAR LENS PLACEMENT (IOC);  Surgeon: Tonny Branch, MD;  Location: [facility];  Service: Ophthalmology;  Laterality: Left;  CDE 5.05  . CHOLECYSTECTOMY     2014, non-functioning GB  . COLONOSCOPY N/A 11/11/2015   Procedure: COLONOSCOPY;  Surgeon: Rogene Houston, MD;  Location: [facility];  Service: Endoscopy;  Laterality: N/A;  1:30  . TONSILLECTOMY      No Known Allergies  Current Outpatient Prescriptions on File Prior to Visit  Medication Sig Dispense Refill  . ALPRAZolam (XANAX) 1 MG tablet Take 1 mg by mouth at bedtime.    . Ascorbic Acid (VITAMIN C) 1000 MG tablet Take 1,000 mg by mouth daily.    Marland Kitchen aspirin 81 MG tablet Take 81 mg by mouth daily.    . celecoxib (CELEBREX) 200 MG capsule Take 200 mg by mouth daily.    . cetirizine (ZYRTEC) 10 MG tablet Take 10 mg by mouth daily.    . Cholecalciferol (D3 MAXIMUM STRENGTH) 5000 UNITS capsule Take 5,000 Units by mouth daily.    . cyanocobalamin 2000 MCG tablet Take 2,000 mcg by mouth daily.    Marland Kitchen FLUoxetine (PROZAC) 20 MG tablet Take 20 mg by mouth daily.    Marland Kitchen HYDROcodone-acetaminophen (NORCO/VICODIN) 5-325 MG per tablet Take 1 tablet  by mouth every 6 (six) hours as needed for moderate pain.    Marland Kitchen levothyroxine (SYNTHROID, LEVOTHROID) 50 MCG tablet Take 50 mcg by mouth daily before breakfast.    . Magnesium 400 MG TABS Take 400 mg by mouth daily.    . Omega-3 Fatty Acids (FISH OIL) 1000 MG CAPS Take 1 capsule by mouth daily.    . pantoprazole (PROTONIX) 40 MG tablet Take 40 mg by mouth daily after lunch.      No current facility-administered medications on file prior to visit.        Objective:   Physical Exam Blood pressure (!) 100/50, pulse 60, temperature 97.8 F (36.6 C), height 5\' 3"  (1.6 m), weight 133 lb 14.4 oz (60.7 kg).  Alert and oriented. Skin warm and dry. Oral mucosa is moist.   . Sclera anicteric, conjunctivae is pink. Thyroid not enlarged. No cervical lymphadenopathy. Lungs  clear. Heart regular rate and rhythm.  Abdomen is soft. Bowel sounds are positive. No hepatomegaly. No abdominal masses felt. No tenderness.  No edema to lower extremities.         Assessment & Plan:  Bloating/Constipation. Am going to try her on Amitiza. Stool dairy. OV in 8 weeks.

## 2016-10-10 NOTE — Patient Instructions (Signed)
Rx for Amitiza. Samples of Amitiza Stool dairy. OV in 8 weeks.

## 2016-10-22 ENCOUNTER — Other Ambulatory Visit (INDEPENDENT_AMBULATORY_CARE_PROVIDER_SITE_OTHER): Payer: Self-pay | Admitting: Internal Medicine

## 2016-10-22 ENCOUNTER — Telehealth (INDEPENDENT_AMBULATORY_CARE_PROVIDER_SITE_OTHER): Payer: Self-pay | Admitting: Internal Medicine

## 2016-10-22 DIAGNOSIS — R140 Abdominal distension (gaseous): Secondary | ICD-10-CM

## 2016-10-22 DIAGNOSIS — R6881 Early satiety: Secondary | ICD-10-CM

## 2016-10-22 NOTE — Telephone Encounter (Signed)
She continues to bloat. Her BMs are okay. When she eats she bloats. She has to eat small amts due to the bloating. Patient would like to proceed with EGD.

## 2016-10-23 ENCOUNTER — Telehealth (INDEPENDENT_AMBULATORY_CARE_PROVIDER_SITE_OTHER): Payer: Self-pay | Admitting: *Deleted

## 2016-10-23 ENCOUNTER — Encounter (INDEPENDENT_AMBULATORY_CARE_PROVIDER_SITE_OTHER): Payer: Self-pay | Admitting: *Deleted

## 2016-10-23 DIAGNOSIS — R6881 Early satiety: Secondary | ICD-10-CM | POA: Insufficient documentation

## 2016-10-23 DIAGNOSIS — R140 Abdominal distension (gaseous): Secondary | ICD-10-CM | POA: Insufficient documentation

## 2016-10-23 NOTE — Telephone Encounter (Signed)
Patient states Joeseph Amor is working and wants RX called to [facility] in [place]

## 2016-10-23 NOTE — Telephone Encounter (Signed)
EGD sch'd 12/19/16 @ 225, patient aware

## 2016-10-24 ENCOUNTER — Telehealth (INDEPENDENT_AMBULATORY_CARE_PROVIDER_SITE_OTHER): Payer: Self-pay | Admitting: Internal Medicine

## 2016-10-24 DIAGNOSIS — K5909 Other constipation: Secondary | ICD-10-CM

## 2016-10-24 MED ORDER — LUBIPROSTONE 24 MCG PO CAPS: 24.0000 ug | ORAL_CAPSULE | Freq: Every day | ORAL | 3 refills | Status: DC

## 2016-10-24 NOTE — Telephone Encounter (Signed)
Rx sent to her pharmacy 

## 2016-10-24 NOTE — Telephone Encounter (Signed)
Rx for Amitiza sent to her pharmacy 

## 2016-10-26 DIAGNOSIS — M47816 Spondylosis without myelopathy or radiculopathy, lumbar region: Secondary | ICD-10-CM | POA: Diagnosis not present

## 2016-11-15 DIAGNOSIS — Z9049 Acquired absence of other specified parts of digestive tract: Secondary | ICD-10-CM | POA: Diagnosis not present

## 2016-11-15 DIAGNOSIS — Z90710 Acquired absence of both cervix and uterus: Secondary | ICD-10-CM | POA: Diagnosis not present

## 2016-11-15 DIAGNOSIS — E039 Hypothyroidism, unspecified: Secondary | ICD-10-CM | POA: Diagnosis not present

## 2016-11-15 DIAGNOSIS — Z79899 Other long term (current) drug therapy: Secondary | ICD-10-CM | POA: Diagnosis not present

## 2016-11-15 DIAGNOSIS — Y838 Other surgical procedures as the cause of abnormal reaction of the patient, or of later complication, without mention of misadventure at the time of the procedure: Secondary | ICD-10-CM | POA: Diagnosis not present

## 2016-11-15 DIAGNOSIS — Z809 Family history of malignant neoplasm, unspecified: Secondary | ICD-10-CM | POA: Diagnosis not present

## 2016-11-15 DIAGNOSIS — M47816 Spondylosis without myelopathy or radiculopathy, lumbar region: Secondary | ICD-10-CM | POA: Diagnosis not present

## 2016-11-15 DIAGNOSIS — K219 Gastro-esophageal reflux disease without esophagitis: Secondary | ICD-10-CM | POA: Diagnosis not present

## 2016-11-15 DIAGNOSIS — M1990 Unspecified osteoarthritis, unspecified site: Secondary | ICD-10-CM | POA: Diagnosis not present

## 2016-11-15 DIAGNOSIS — T85840A Pain due to nervous system prosthetic devices, implants and grafts, initial encounter: Secondary | ICD-10-CM | POA: Diagnosis not present

## 2016-11-15 DIAGNOSIS — Z8489 Family history of other specified conditions: Secondary | ICD-10-CM | POA: Diagnosis not present

## 2016-11-15 DIAGNOSIS — Z981 Arthrodesis status: Secondary | ICD-10-CM | POA: Diagnosis not present

## 2016-11-15 DIAGNOSIS — G8918 Other acute postprocedural pain: Secondary | ICD-10-CM | POA: Diagnosis not present

## 2016-11-15 DIAGNOSIS — F419 Anxiety disorder, unspecified: Secondary | ICD-10-CM | POA: Diagnosis not present

## 2016-11-15 DIAGNOSIS — T85192D Other mechanical complication of implanted electronic neurostimulator (electrode) of spinal cord, subsequent encounter: Secondary | ICD-10-CM | POA: Diagnosis not present

## 2016-11-15 HISTORY — PX: OTHER SURGICAL HISTORY: SHX169

## 2016-11-16 DIAGNOSIS — T85840A Pain due to nervous system prosthetic devices, implants and grafts, initial encounter: Secondary | ICD-10-CM | POA: Diagnosis not present

## 2016-11-16 DIAGNOSIS — M47816 Spondylosis without myelopathy or radiculopathy, lumbar region: Secondary | ICD-10-CM | POA: Diagnosis not present

## 2016-11-16 DIAGNOSIS — E039 Hypothyroidism, unspecified: Secondary | ICD-10-CM | POA: Diagnosis not present

## 2016-11-16 DIAGNOSIS — G8918 Other acute postprocedural pain: Secondary | ICD-10-CM | POA: Diagnosis not present

## 2016-11-16 DIAGNOSIS — F419 Anxiety disorder, unspecified: Secondary | ICD-10-CM | POA: Diagnosis not present

## 2016-11-16 DIAGNOSIS — K219 Gastro-esophageal reflux disease without esophagitis: Secondary | ICD-10-CM | POA: Diagnosis not present

## 2016-12-05 ENCOUNTER — Ambulatory Visit (INDEPENDENT_AMBULATORY_CARE_PROVIDER_SITE_OTHER): Payer: Medicare Other | Admitting: Internal Medicine

## 2016-12-19 ENCOUNTER — Encounter (HOSPITAL_COMMUNITY): Payer: Self-pay | Admitting: *Deleted

## 2016-12-19 ENCOUNTER — Encounter (HOSPITAL_COMMUNITY)
Admission: RE | Disposition: A | Discharge status: Home / Self Care | Payer: Self-pay | Source: Ambulatory Visit | Attending: Internal Medicine

## 2016-12-19 ENCOUNTER — Ambulatory Visit (HOSPITAL_COMMUNITY)
Admission: RE | Admit: 2016-12-19 | Discharge: 2016-12-19 | Disposition: A | Discharge status: Home / Self Care | Payer: Medicare Other | Source: Ambulatory Visit | Attending: Internal Medicine | Admitting: Internal Medicine

## 2016-12-19 DIAGNOSIS — K2970 Gastritis, unspecified, without bleeding: Secondary | ICD-10-CM | POA: Insufficient documentation

## 2016-12-19 DIAGNOSIS — G8929 Other chronic pain: Secondary | ICD-10-CM | POA: Diagnosis not present

## 2016-12-19 DIAGNOSIS — Z79899 Other long term (current) drug therapy: Secondary | ICD-10-CM | POA: Diagnosis not present

## 2016-12-19 DIAGNOSIS — H409 Unspecified glaucoma: Secondary | ICD-10-CM | POA: Insufficient documentation

## 2016-12-19 DIAGNOSIS — M549 Dorsalgia, unspecified: Secondary | ICD-10-CM | POA: Diagnosis not present

## 2016-12-19 DIAGNOSIS — K219 Gastro-esophageal reflux disease without esophagitis: Secondary | ICD-10-CM | POA: Diagnosis not present

## 2016-12-19 DIAGNOSIS — E039 Hypothyroidism, unspecified: Secondary | ICD-10-CM | POA: Diagnosis not present

## 2016-12-19 DIAGNOSIS — F329 Major depressive disorder, single episode, unspecified: Secondary | ICD-10-CM | POA: Diagnosis not present

## 2016-12-19 DIAGNOSIS — R140 Abdominal distension (gaseous): Secondary | ICD-10-CM

## 2016-12-19 DIAGNOSIS — H3530 Unspecified macular degeneration: Secondary | ICD-10-CM | POA: Insufficient documentation

## 2016-12-19 DIAGNOSIS — K31819 Angiodysplasia of stomach and duodenum without bleeding: Secondary | ICD-10-CM | POA: Insufficient documentation

## 2016-12-19 DIAGNOSIS — F419 Anxiety disorder, unspecified: Secondary | ICD-10-CM | POA: Diagnosis not present

## 2016-12-19 DIAGNOSIS — K5900 Constipation, unspecified: Secondary | ICD-10-CM | POA: Insufficient documentation

## 2016-12-19 DIAGNOSIS — K3189 Other diseases of stomach and duodenum: Secondary | ICD-10-CM | POA: Diagnosis not present

## 2016-12-19 DIAGNOSIS — R6881 Early satiety: Secondary | ICD-10-CM | POA: Insufficient documentation

## 2016-12-19 DIAGNOSIS — M797 Fibromyalgia: Secondary | ICD-10-CM | POA: Insufficient documentation

## 2016-12-19 DIAGNOSIS — R634 Abnormal weight loss: Secondary | ICD-10-CM | POA: Diagnosis not present

## 2016-12-19 DIAGNOSIS — Z791 Long term (current) use of non-steroidal anti-inflammatories (NSAID): Secondary | ICD-10-CM | POA: Diagnosis not present

## 2016-12-19 HISTORY — PX: ESOPHAGOGASTRODUODENOSCOPY: SHX5428

## 2016-12-19 SURGERY — EGD (ESOPHAGOGASTRODUODENOSCOPY)
Anesthesia: Moderate Sedation

## 2016-12-19 MED ORDER — SODIUM CHLORIDE 0.9 % IV SOLN
INTRAVENOUS | Status: DC
  Administered 2016-12-19: 1000 mL via INTRAVENOUS

## 2016-12-19 MED ORDER — STERILE WATER FOR IRRIGATION IR SOLN
Status: DC | PRN
  Administered 2016-12-19: 2.5 mL

## 2016-12-19 MED ORDER — MEPERIDINE HCL 50 MG/ML IJ SOLN
INTRAMUSCULAR | Status: AC
  Filled 2016-12-19: qty 1

## 2016-12-19 MED ORDER — MIDAZOLAM HCL 5 MG/5ML IJ SOLN
INTRAMUSCULAR | Status: DC | PRN
  Administered 2016-12-19 (×4): 2 mg via INTRAVENOUS

## 2016-12-19 MED ORDER — MEPERIDINE HCL 50 MG/ML IJ SOLN
INTRAMUSCULAR | Status: DC | PRN
  Administered 2016-12-19 (×3): 25 mg via INTRAVENOUS

## 2016-12-19 MED ORDER — MIDAZOLAM HCL 5 MG/5ML IJ SOLN
INTRAMUSCULAR | Status: AC
  Filled 2016-12-19: qty 10

## 2016-12-19 NOTE — Discharge Instructions (Signed)
Esophagogastroduodenoscopy Introduction Esophagogastroduodenoscopy (EGD) is a procedure to examine the lining of the esophagus, stomach, and first part of the small intestine (duodenum). This procedure is done to check for problems such as inflammation, bleeding, ulcers, or growths. During this procedure, a long, flexible, lighted tube with a camera attached (endoscope) is inserted down the throat. Tell a health care provider about:  Any allergies you have.  All medicines you are taking, including vitamins, herbs, eye drops, creams, and over-the-counter medicines.  Any problems you or family members have had with anesthetic medicines.  Any blood disorders you have.  Any surgeries you have had.  Any medical conditions you have.  Whether you are pregnant or may be pregnant. What are the risks? Generally, this is a safe procedure. However, problems may occur, including:  Infection.  Bleeding.  A tear (perforation) in the esophagus, stomach, or duodenum.  Trouble breathing.  Excessive sweating.  Spasms of the larynx.  A slowed heartbeat.  Low blood pressure. What happens before the procedure?  Follow instructions from your health care provider about eating or drinking restrictions.  Ask your health care provider about:  Changing or stopping your regular medicines. This is especially important if you are taking diabetes medicines or blood thinners.  Taking medicines such as aspirin and ibuprofen. These medicines can thin your blood. Do not take these medicines before your procedure if your health care provider instructs you not to.  Plan to have someone take you home after the procedure.  If you wear dentures, be ready to remove them before the procedure. What happens during the procedure?  To reduce your risk of infection, your health care team will wash or sanitize their hands.  An IV tube will be put in a vein in your hand or arm. You will get medicines and fluids  through this tube.  You will be given one or more of the following:  A medicine to help you relax (sedative).  A medicine to numb the area (local anesthetic). This medicine may be sprayed into your throat. It will make you feel more comfortable and keep you from gagging or coughing during the procedure.  A medicine for pain.  A mouth guard may be placed in your mouth to protect your teeth and to keep you from biting on the endoscope.  You will be asked to lie on your left side.  The endoscope will be lowered down your throat into your esophagus, stomach, and duodenum.  Air will be put into the endoscope. This will help your health care provider see better.  The lining of your esophagus, stomach, and duodenum will be examined.  Your health care provider may:  Take a tissue sample so it can be looked at in a lab (biopsy).  Remove growths.  Remove objects (foreign bodies) that are stuck.  Treat any bleeding with medicines or other devices that stop tissue from bleeding.  Widen (dilate) or stretch narrowed areas of your esophagus and stomach.  The endoscope will be taken out. The procedure may vary among health care providers and hospitals. What happens after the procedure?  Your blood pressure, heart rate, breathing rate, and blood oxygen level will be monitored often until the medicines you were given have worn off.  Do not eat or drink anything until the numbing medicine has worn off and your gag reflex has returned. This information is not intended to replace advice given to you by your health care provider. Make sure you discuss any questions you  have with your health care provider. Document Released: 03/15/2005 Document Revised: 04/19/2016 Document Reviewed: 10/06/2015  2017 Elsevier Resume usual medications and diet. If possible take Celebrex on as-needed basis. No driving for 24 hours. Physician will call with biopsy results.

## 2016-12-19 NOTE — Op Note (Signed)
[facility] Patient Name: [name] Procedure Date: 12/19/2016 2:00 PM MRN: [id number] Date of Birth: 1941/10/17 Attending MD: Hildred Laser , MD CSN: [id number] Age: 76 Admit Type: Outpatient Procedure:                Upper GI endoscopy Indications:              Abdominal bloating, Early satiety, Nausea with                            vomiting, Weight loss Providers:                Hildred Laser, MD, Charlyne Petrin RN, RN, Aram Candela Referring MD:             Jasper Loser. Luan Pulling, MD Medicines:                Cetacaine spray, Meperidine 75 mg IV, Midazolam 8                            mg IV Complications:            No immediate complications. Estimated Blood Loss:     Estimated blood loss was minimal. Procedure:                Pre-Anesthesia Assessment:                           - Prior to the procedure, a History and Physical                            was performed, and patient medications and                            allergies were reviewed. The patient's tolerance of                            previous anesthesia was also reviewed. The risks                            and benefits of the procedure and the sedation                            options and risks were discussed with the patient.                            All questions were answered, and informed consent                            was obtained. Prior Anticoagulants: The patient                            last took previous NSAID medication 1 day prior to  the procedure. ASA Grade Assessment: II - A patient                            with mild systemic disease. After reviewing the                            risks and benefits, the patient was deemed in                            satisfactory condition to undergo the procedure.                           After obtaining informed consent, the endoscope was                            passed under direct  vision. Throughout the                            procedure, the patient's blood pressure, pulse, and                            oxygen saturations were monitored continuously. The                            [id number]) was introduced through the                            mouth, and advanced to the second part of duodenum.                            The upper GI endoscopy was accomplished without                            difficulty. The patient tolerated the procedure                            well. Scope In: 2:34:53 PM Scope Out: 2:40:18 PM Total Procedure Duration: 0 hours 5 minutes 25 seconds  Findings:      The examined esophagus was normal.      The Z-line was regular and was found 38 cm from the incisors.      Mild gastric antral vascular ectasia without bleeding was present in the       gastric antrum and in the prepyloric region of the stomach. Biopsies       were taken with a cold forceps for histology.      The exam of the stomach was otherwise normal.      The duodenal bulb and second portion of the duodenum were normal. Impression:               - Normal esophagus.                           - Z-line regular, 38 cm from the incisors.                           -  Gastric antral vascular ectasia without bleeding.                            Biopsied.                           - Normal duodenal bulb and second portion of the                            duodenum. Moderate Sedation:      Moderate (conscious) sedation was administered by the endoscopy nurse       and supervised by the endoscopist. The following parameters were       monitored: oxygen saturation, heart rate, blood pressure, CO2       capnography and response to care. Total physician intraservice time was       14 minutes. Recommendation:           - Patient has a contact number available for                            emergencies. The signs and symptoms of potential                            delayed  complications were discussed with the                            patient. Return to normal activities tomorrow.                            Written discharge instructions were provided to the                            patient.                           - Resume previous diet today.                           - Continue present medications.                           - Await pathology results. Procedure Code(s):        --- Professional ---                           [phone], Esophagogastroduodenoscopy, flexible,                            transoral; with biopsy, single or multiple                           [id number], Moderate sedation services provided by the                            same physician or other qualified health care  professional performing the diagnostic or                            therapeutic service that the sedation supports,                            requiring the presence of an independent trained                            observer to assist in the monitoring of the                            patient's level of consciousness and physiological                            status; initial 15 minutes of intraservice time,                            patient age 104 years or older Diagnosis Code(s):        --- Professional ---                           K31.819, Angiodysplasia of stomach and duodenum                            without bleeding                           R14.0, Abdominal distension (gaseous)                           R68.81, Early satiety                           R11.2, Nausea with vomiting, unspecified                           R63.4, Abnormal weight loss CPT copyright 2016 [employer]. All rights reserved. The codes documented in this report are preliminary and upon coder review may  be revised to meet current compliance requirements. Hildred Laser, MD Hildred Laser, MD 12/19/2016 2:47:55 PM This report has been signed  electronically. Number of Addenda: 0

## 2016-12-19 NOTE — H&P (Signed)
[name] is an 76 y.o. female.   Chief Complaint: Patient is here for EGD. HPI: Patient is 76 year old Caucasian female who presents with several month history of bloating worse after meals sporadic nausea and vomiting as well as early satiety. She states she has lost 10 pounds over the last 8 months. She says appetite is good but she is afraid to eat. She says her constipation is improved with Amitiza other symptoms have not improved. She is on NSAIDs and pain medication for chronic back pain. She is on chronic PPI therapy primarily for GERD. She is not having any heartburn or dysphagia. She denies hematemesis melena or rectal bleeding.  Past Medical History:  Diagnosis Date  . Anxiety   . Chronic back pain   . Chronic sinusitis   . Depression   . Fibromyalgia   . GERD (gastroesophageal reflux disease)   . Glaucoma   . Hypothyroidism   . Macular degeneration   . PONV (postoperative nausea and vomiting)     Past Surgical History:  Procedure Laterality Date  . ABDOMINAL HYSTERECTOMY     1985  . ABDOMINAL HYSTERECTOMY    . back stimulator removed  11/15/2016  . back stimulatory    . BACK SURGERY     plate and screws  . BACK SURGERY     plate in back  . CATARACT EXTRACTION W/PHACO Right 03/22/2016   Procedure: CATARACT EXTRACTION PHACO AND INTRAOCULAR LENS PLACEMENT (IOC);  Surgeon: Tonny Branch, MD;  Location: [facility];  Service: Ophthalmology;  Laterality: Right;  CDE 5.69  . CATARACT EXTRACTION W/PHACO Left 04/19/2016   Procedure: CATARACT EXTRACTION PHACO AND INTRAOCULAR LENS PLACEMENT (IOC);  Surgeon: Tonny Branch, MD;  Location: [facility];  Service: Ophthalmology;  Laterality: Left;  CDE 5.05  . CHOLECYSTECTOMY     2014, non-functioning GB  . COLONOSCOPY N/A 11/11/2015   Procedure: COLONOSCOPY;  Surgeon: Rogene Houston, MD;  Location: [facility];  Service: Endoscopy;  Laterality: N/A;  1:30  . TONSILLECTOMY      Family History  Problem Relation Age of Onset  . Allergies  Mother   . Allergies Daughter   . Hodgkin's lymphoma Father   . Liver cancer Sister     "rare liver cancer"   Social History:  reports that she has never smoked. She has never used smokeless tobacco. She reports that she drinks alcohol. She reports that she does not use drugs.  Allergies: No Known Allergies  Medications Prior to Admission  Medication Sig Dispense Refill  . acidophilus (RISAQUAD) CAPS capsule Take 1 capsule by mouth daily.     Marland Kitchen ALPRAZolam (XANAX) 1 MG tablet Take 1 mg by mouth at bedtime.    . celecoxib (CELEBREX) 200 MG capsule Take 200 mg by mouth daily.    . cetirizine (ZYRTEC) 10 MG tablet Take 10 mg by mouth at bedtime.     Marland Kitchen estradiol (CLIMARA - DOSED IN MG/24 HR) 0.025 mg/24hr patch Place 0.025 mg onto the skin every Sunday.  6  . FLUoxetine (PROZAC) 20 MG tablet Take 20 mg by mouth daily.    Marland Kitchen HYDROcodone-acetaminophen (NORCO/VICODIN) 5-325 MG per tablet Take 1 tablet by mouth every 6 (six) hours as needed for moderate pain.    Marland Kitchen levothyroxine (SYNTHROID, LEVOTHROID) 50 MCG tablet Take 50 mcg by mouth daily before breakfast.    . lubiprostone (AMITIZA) 24 MCG capsule Take 1 capsule (24 mcg total) by mouth daily with breakfast. 60 capsule 3  . pantoprazole (PROTONIX)  40 MG tablet Take 40 mg by mouth daily.       No results found for this or any previous visit (from the past 48 hour(s)). No results found.  ROS  Blood pressure 124/62, pulse (!) 58, temperature 97.8 F (36.6 C), temperature source Oral, resp. rate 14, height 5\' 4"  (1.626 m), weight 131 lb (59.4 kg), SpO2 98 %. Physical Exam  Constitutional: She appears well-developed and well-nourished.  HENT:  Mouth/Throat: Oropharynx is clear and moist.  Eyes: Conjunctivae are normal. No scleral icterus.  Neck: No thyromegaly present.  Cardiovascular: Normal rate, regular rhythm and normal heart sounds.   No murmur heard. Respiratory: Effort normal and breath sounds normal.  GI:  Abdomen is full but  soft with mild midepigastric tenderness. No organomegaly or masses.  Musculoskeletal: She exhibits no edema.  Lymphadenopathy:    She has no cervical adenopathy.  Neurological: She is alert.  Skin: Skin is warm and dry.     Assessment/Plan Bloating early satiety nausea vomiting and 10 pound weight loss. Diagnostic EGD.  Hildred Laser, MD 12/19/2016, 2:22 PM

## 2016-12-21 ENCOUNTER — Encounter (HOSPITAL_COMMUNITY): Payer: Self-pay | Admitting: Internal Medicine

## 2017-01-15 ENCOUNTER — Telehealth (INDEPENDENT_AMBULATORY_CARE_PROVIDER_SITE_OTHER): Payer: Self-pay | Admitting: Internal Medicine

## 2017-01-15 NOTE — Telephone Encounter (Signed)
Patient called, stated that she had an EGD and that Terri gave her a medication that really helped a lot, but she has had a couple episodes since the procedure.  She wanted to let Dr. Laural Golden know that she's fixing to call [place] to get Domperidone.  If you need to cal her you can.  cell [phone] work [phone]

## 2017-01-16 NOTE — Telephone Encounter (Signed)
A RX has been called into the [facility] @ [phone], fax number is [phone] as follows: Domperidone 10 mg - Take 1 by mouth three times daily. #90 1 refill. This was given as VO to [facility]. He states that they will call the patient.

## 2017-01-17 DIAGNOSIS — K210 Gastro-esophageal reflux disease with esophagitis: Secondary | ICD-10-CM | POA: Diagnosis not present

## 2017-01-17 DIAGNOSIS — M545 Low back pain: Secondary | ICD-10-CM | POA: Diagnosis not present

## 2017-01-17 DIAGNOSIS — Z79891 Long term (current) use of opiate analgesic: Secondary | ICD-10-CM | POA: Diagnosis not present

## 2017-01-17 DIAGNOSIS — K5904 Chronic idiopathic constipation: Secondary | ICD-10-CM | POA: Diagnosis not present

## 2017-01-17 DIAGNOSIS — R739 Hyperglycemia, unspecified: Secondary | ICD-10-CM | POA: Diagnosis not present

## 2017-01-17 DIAGNOSIS — K3184 Gastroparesis: Secondary | ICD-10-CM | POA: Diagnosis not present

## 2017-05-16 ENCOUNTER — Other Ambulatory Visit (HOSPITAL_COMMUNITY): Payer: Self-pay | Admitting: Pulmonary Disease

## 2017-05-16 DIAGNOSIS — K210 Gastro-esophageal reflux disease with esophagitis: Secondary | ICD-10-CM | POA: Diagnosis not present

## 2017-05-16 DIAGNOSIS — N182 Chronic kidney disease, stage 2 (mild): Secondary | ICD-10-CM | POA: Diagnosis not present

## 2017-05-16 DIAGNOSIS — M549 Dorsalgia, unspecified: Secondary | ICD-10-CM

## 2017-05-16 DIAGNOSIS — M545 Low back pain: Secondary | ICD-10-CM | POA: Diagnosis not present

## 2017-05-16 DIAGNOSIS — K3184 Gastroparesis: Secondary | ICD-10-CM | POA: Diagnosis not present

## 2017-05-22 ENCOUNTER — Ambulatory Visit (HOSPITAL_COMMUNITY)
Admission: RE | Admit: 2017-05-22 | Discharge: 2017-05-22 | Disposition: A | Discharge status: Home / Self Care | Payer: Medicare Other | Source: Ambulatory Visit | Attending: Pulmonary Disease | Admitting: Pulmonary Disease

## 2017-05-22 DIAGNOSIS — M47812 Spondylosis without myelopathy or radiculopathy, cervical region: Secondary | ICD-10-CM | POA: Insufficient documentation

## 2017-05-22 DIAGNOSIS — M47814 Spondylosis without myelopathy or radiculopathy, thoracic region: Secondary | ICD-10-CM | POA: Diagnosis not present

## 2017-05-22 DIAGNOSIS — M546 Pain in thoracic spine: Secondary | ICD-10-CM | POA: Diagnosis not present

## 2017-05-22 DIAGNOSIS — M5124 Other intervertebral disc displacement, thoracic region: Secondary | ICD-10-CM | POA: Insufficient documentation

## 2017-05-22 DIAGNOSIS — M549 Dorsalgia, unspecified: Secondary | ICD-10-CM | POA: Insufficient documentation

## 2017-05-22 DIAGNOSIS — M419 Scoliosis, unspecified: Secondary | ICD-10-CM | POA: Diagnosis not present

## 2017-06-27 ENCOUNTER — Other Ambulatory Visit (INDEPENDENT_AMBULATORY_CARE_PROVIDER_SITE_OTHER): Payer: Self-pay | Admitting: Internal Medicine

## 2017-06-27 DIAGNOSIS — K5909 Other constipation: Secondary | ICD-10-CM

## 2017-08-14 ENCOUNTER — Encounter (HOSPITAL_COMMUNITY): Payer: Self-pay

## 2017-08-14 ENCOUNTER — Emergency Department (HOSPITAL_COMMUNITY): Payer: Medicare Other

## 2017-08-14 ENCOUNTER — Emergency Department (HOSPITAL_COMMUNITY)
Admission: EM | Admit: 2017-08-14 | Discharge: 2017-08-14 | Disposition: A | Discharge status: Home / Self Care | Payer: Medicare Other | Attending: Emergency Medicine | Admitting: Emergency Medicine

## 2017-08-14 DIAGNOSIS — R1900 Intra-abdominal and pelvic swelling, mass and lump, unspecified site: Secondary | ICD-10-CM | POA: Insufficient documentation

## 2017-08-14 DIAGNOSIS — K639 Disease of intestine, unspecified: Secondary | ICD-10-CM | POA: Diagnosis not present

## 2017-08-14 DIAGNOSIS — R52 Pain, unspecified: Secondary | ICD-10-CM | POA: Diagnosis not present

## 2017-08-14 DIAGNOSIS — R1909 Other intra-abdominal and pelvic swelling, mass and lump: Secondary | ICD-10-CM | POA: Diagnosis not present

## 2017-08-14 DIAGNOSIS — N200 Calculus of kidney: Secondary | ICD-10-CM | POA: Diagnosis not present

## 2017-08-14 DIAGNOSIS — K6389 Other specified diseases of intestine: Secondary | ICD-10-CM

## 2017-08-14 DIAGNOSIS — E039 Hypothyroidism, unspecified: Secondary | ICD-10-CM | POA: Insufficient documentation

## 2017-08-14 DIAGNOSIS — G8929 Other chronic pain: Secondary | ICD-10-CM

## 2017-08-14 DIAGNOSIS — M549 Dorsalgia, unspecified: Secondary | ICD-10-CM | POA: Diagnosis not present

## 2017-08-14 DIAGNOSIS — M545 Low back pain, unspecified: Secondary | ICD-10-CM

## 2017-08-14 DIAGNOSIS — M546 Pain in thoracic spine: Secondary | ICD-10-CM | POA: Diagnosis not present

## 2017-08-14 DIAGNOSIS — Z79899 Other long term (current) drug therapy: Secondary | ICD-10-CM | POA: Insufficient documentation

## 2017-08-14 DIAGNOSIS — R9431 Abnormal electrocardiogram [ECG] [EKG]: Secondary | ICD-10-CM | POA: Diagnosis not present

## 2017-08-14 LAB — CBC WITH DIFFERENTIAL/PLATELET
BASOS PCT: 0 %
Basophils Absolute: 0 10*3/uL (ref 0.0–0.1)
EOS PCT: 3 %
Eosinophils Absolute: 0.2 10*3/uL (ref 0.0–0.7)
HCT: 33.4 % — ABNORMAL LOW (ref 36.0–46.0)
HEMOGLOBIN: 11 g/dL — AB (ref 12.0–15.0)
LYMPHS ABS: 1.1 10*3/uL (ref 0.7–4.0)
Lymphocytes Relative: 20 %
MCH: 30 pg (ref 26.0–34.0)
MCHC: 32.9 g/dL (ref 30.0–36.0)
MCV: 91 fL (ref 78.0–100.0)
MONOS PCT: 12 %
Monocytes Absolute: 0.7 10*3/uL (ref 0.1–1.0)
NEUTROS PCT: 65 %
Neutro Abs: 3.7 10*3/uL (ref 1.7–7.7)
Platelets: 241 10*3/uL (ref 150–400)
RBC: 3.67 MIL/uL — ABNORMAL LOW (ref 3.87–5.11)
RDW: 13.5 % (ref 11.5–15.5)
WBC: 5.8 10*3/uL (ref 4.0–10.5)

## 2017-08-14 LAB — BASIC METABOLIC PANEL
ANION GAP: 8 (ref 5–15)
BUN: 22 mg/dL — ABNORMAL HIGH (ref 6–20)
CALCIUM: 8.4 mg/dL — AB (ref 8.9–10.3)
CO2: 25 mmol/L (ref 22–32)
CREATININE: 1.19 mg/dL — AB (ref 0.44–1.00)
Chloride: 105 mmol/L (ref 101–111)
GFR calc Af Amer: 51 mL/min — ABNORMAL LOW (ref >60)
GFR, EST NON AFRICAN AMERICAN: 44 mL/min — AB (ref >60)
GLUCOSE: 131 mg/dL — AB (ref 65–99)
Potassium: 3.6 mmol/L (ref 3.5–5.1)
Sodium: 138 mmol/L (ref 135–145)

## 2017-08-14 LAB — URINALYSIS, ROUTINE W REFLEX MICROSCOPIC
BACTERIA UA: NONE SEEN
BILIRUBIN URINE: NEGATIVE
Glucose, UA: NEGATIVE mg/dL
Hgb urine dipstick: NEGATIVE
KETONES UR: NEGATIVE mg/dL
NITRITE: NEGATIVE
Protein, ur: NEGATIVE mg/dL
SPECIFIC GRAVITY, URINE: 1.028 (ref 1.005–1.030)
pH: 5 (ref 5.0–8.0)

## 2017-08-14 LAB — RAPID URINE DRUG SCREEN, HOSP PERFORMED
Amphetamines: NOT DETECTED
Barbiturates: NOT DETECTED
Benzodiazepines: POSITIVE — AB
Cocaine: NOT DETECTED
Opiates: POSITIVE — AB
Tetrahydrocannabinol: NOT DETECTED

## 2017-08-14 LAB — TROPONIN I: Troponin I: <0.03 ng/mL (ref <0.03)

## 2017-08-14 MED ORDER — SODIUM CHLORIDE 0.9 % IV BOLUS (SEPSIS)
1000.0000 mL | Freq: Once | INTRAVENOUS | Status: AC
  Administered 2017-08-14: 1000 mL via INTRAVENOUS

## 2017-08-14 MED ORDER — METHOCARBAMOL 500 MG PO TABS: 1000.0000 mg | ORAL_TABLET | Freq: Four times a day (QID) | ORAL | 0 refills | Status: DC | PRN

## 2017-08-14 MED ORDER — FENTANYL CITRATE (PF) 100 MCG/2ML IJ SOLN
25.0000 ug | INTRAMUSCULAR | Status: DC | PRN
Start: 2017-08-14 — End: 2017-08-14
  Administered 2017-08-14: 25 ug via INTRAVENOUS
  Filled 2017-08-14: qty 2

## 2017-08-14 NOTE — Discharge Instructions (Signed)
Take the new prescription as directed; do not take this medication if you are taking your zanaflex (this medication is a muscle relaxer).  Apply moist heat or ice to the area(s) of discomfort, for 15 minutes at a time, several times per day for the next few days.  Do not fall asleep on a heating or ice pack.  Your CT scan showed an incidental finding:  "1.7 x 2.5 cm soft tissue lesion in the right mid abdominal mesentery, with tethering of adjacent loops of small bowel."  Call your regular medical doctor tomorrow to schedule a follow up appointment this week. If you do not receive a call from the Oncologist's office tomorrow morning, call the Oncologist tomorrow to schedule a follow up appointment this week for your CT finding above.  Return to the Emergency Department immediately if worsening.

## 2017-08-14 NOTE — ED Triage Notes (Signed)
Pt reports history of chronic lower back pain.  Reports hasn't been able to get pain under control since Sunday.  Has taken vicodin and a muscle relaxer today.  Reports nausea.

## 2017-08-14 NOTE — ED Provider Notes (Signed)
[place] DEPT Provider Note   CSN: [id number] Arrival date & time: 08/14/17  1420     History   Chief Complaint Chief Complaint  Patient presents with  . Back Pain    HPI [name] is a 76 y.o. female.  HPI  Pt was seen at 1425. Per pt, c/o gradual onset and persistence of constant acute flair of her chronic low back "pain" for the past 3 days.  Denies any change in her usual chronic pain pattern.  Pain worsens with palpation of the area and body position changes. Pt states she took a hydrocodone and "a muscle relaxer" PTA. Denies incont/retention of bowel or bladder, no saddle anesthesia, no focal motor weakness, no tingling/numbness in extremities, no fevers, no injury, no abd pain.   The symptoms have been associated with no other complaints. The patient has a significant history of similar symptoms previously, recently being evaluated for this complaint and multiple prior evals for same.    Past Medical History:  Diagnosis Date  . Anxiety   . Chronic back pain   . Chronic sinusitis   . Depression   . Fibromyalgia   . GERD (gastroesophageal reflux disease)   . Glaucoma   . Hypothyroidism   . Macular degeneration   . PONV (postoperative nausea and vomiting)     Patient Active Problem List   Diagnosis Date Noted  . Early satiety 10/23/2016  . Bloating 10/23/2016  . GERD (gastroesophageal reflux disease) 10/10/2016  . Chronic back pain 10/10/2016  . Cough 02/03/2015    Past Surgical History:  Procedure Laterality Date  . ABDOMINAL HYSTERECTOMY     1985  . ABDOMINAL HYSTERECTOMY    . back stimulator removed  11/15/2016  . back stimulatory    . BACK SURGERY     plate and screws  . BACK SURGERY     plate in back  . CATARACT EXTRACTION W/PHACO Right 03/22/2016   Procedure: CATARACT EXTRACTION PHACO AND INTRAOCULAR LENS PLACEMENT (IOC);  Surgeon: Tonny Branch, MD;  Location: [facility];  Service: Ophthalmology;  Laterality: Right;  CDE 5.69  . CATARACT  EXTRACTION W/PHACO Left 04/19/2016   Procedure: CATARACT EXTRACTION PHACO AND INTRAOCULAR LENS PLACEMENT (IOC);  Surgeon: Tonny Branch, MD;  Location: [facility];  Service: Ophthalmology;  Laterality: Left;  CDE 5.05  . CHOLECYSTECTOMY     2014, non-functioning GB  . COLONOSCOPY N/A 11/11/2015   Procedure: COLONOSCOPY;  Surgeon: Rogene Houston, MD;  Location: [facility];  Service: Endoscopy;  Laterality: N/A;  1:30  . ESOPHAGOGASTRODUODENOSCOPY N/A 12/19/2016   Procedure: ESOPHAGOGASTRODUODENOSCOPY (EGD);  Surgeon: Rogene Houston, MD;  Location: [facility];  Service: Endoscopy;  Laterality: N/A;  2:25  . TONSILLECTOMY      OB History    No data available       Home Medications    Prior to Admission medications   Medication Sig Start Date End Date Taking? Authorizing Provider  acidophilus (RISAQUAD) CAPS capsule Take 1 capsule by mouth daily.    Yes [provider]  ALPRAZolam Duanne Moron) 1 MG tablet Take 1 mg by mouth at bedtime.   Yes [provider]  AMITIZA 24 MCG capsule TAKE 1 CAPSULE (24 MCG TOTAL) BY MOUTH DAILY WITH BREAKFAST. 06/27/17  Yes Setzer, Terri L, NP  celecoxib (CELEBREX) 200 MG capsule Take 200 mg by mouth daily.   Yes [provider]  estradiol (CLIMARA - DOSED IN MG/24 HR) 0.025 mg/24hr patch Place 0.025 mg onto the  skin every Sunday. 11/28/16  Yes [provider]  FLUoxetine (PROZAC) 20 MG tablet Take 20 mg by mouth daily.   Yes [provider]  HYDROcodone-acetaminophen (NORCO/VICODIN) 5-325 MG per tablet Take 1 tablet by mouth every 6 (six) hours as needed for moderate pain.   Yes [provider]  levothyroxine (SYNTHROID, LEVOTHROID) 50 MCG tablet Take 50 mcg by mouth daily before breakfast.   Yes [provider]  pantoprazole (PROTONIX) 40 MG tablet Take 40 mg by mouth daily.    Yes [provider]  tiZANidine (ZANAFLEX) 4 MG tablet Take 4 mg by mouth every 6 (six) hours as needed for muscle  spasms.   Yes [provider]  cetirizine (ZYRTEC) 10 MG tablet Take 10 mg by mouth at bedtime.     [provider]    Family History Family History  Problem Relation Age of Onset  . Allergies Mother   . Allergies Daughter   . Hodgkin's lymphoma Father   . Liver cancer Sister        "rare liver cancer"    Social History Social History  Substance Use Topics  . Smoking status: Never Smoker  . Smokeless tobacco: Never Used  . Alcohol use 0.0 oz/week     Comment: rare     Allergies   Patient has no known allergies.   Review of Systems Review of Systems ROS: Statement: All systems negative except as marked or noted in the HPI; Constitutional: Negative for fever and chills. ; ; Eyes: Negative for eye pain, redness and discharge. ; ; ENMT: Negative for ear pain, hoarseness, nasal congestion, sinus pressure and sore throat. ; ; Cardiovascular: Negative for chest pain, palpitations, diaphoresis, dyspnea and peripheral edema. ; ; Respiratory: Negative for cough, wheezing and stridor. ; ; Gastrointestinal: Negative for nausea, vomiting, diarrhea, abdominal pain, blood in stool, hematemesis, jaundice and rectal bleeding. . ; ; Genitourinary: Negative for dysuria, flank pain and hematuria. ; ; Musculoskeletal: +chronic LBP. Negative for neck pain. Negative for swelling and trauma.; ; Skin: Negative for pruritus, rash, abrasions, blisters, bruising and skin lesion.; ; Neuro: Negative for headache, lightheadedness and neck stiffness. Negative for weakness, altered level of consciousness, altered mental status, extremity weakness, paresthesias, involuntary movement, seizure and syncope.       Physical Exam Updated Vital Signs BP 127/68   Pulse 61   Temp (!) 97.5 F (36.4 C) (Oral)   Resp 17   Ht 5\' 4"  (1.626 m)   Wt 57.6 kg (127 lb)   SpO2 100%   BMI 21.80 kg/m    Patient Vitals for the past 24 hrs:  BP Temp Temp src Pulse Resp SpO2 Height Weight  08/14/17 1530  127/68 - - 61 17 100 % - -  08/14/17 1427 (!) 84/39 (!) 97.5 F (36.4 C) Oral 64 16 99 % - -  08/14/17 1425 - - - - - - 5\' 4"  (1.626 m) 57.6 kg (127 lb)    Physical Exam 1430: Physical examination:  Nursing notes reviewed; Vital signs and O2 SAT reviewed;  Constitutional: Well developed, Well nourished, Well hydrated, In no acute distress; Head:  Normocephalic, atraumatic; Eyes: EOMI, PERRL, No scleral icterus; ENMT: Mouth and pharynx normal, Mucous membranes moist; Neck: Supple, Full range of motion, No lymphadenopathy; Cardiovascular: Regular rate and rhythm, No gallop; Respiratory: Breath sounds clear & equal bilaterally, No wheezes.  Speaking full sentences with ease, Normal respiratory effort/excursion; Chest: Nontender, Movement normal; Abdomen: Soft, Nontender, Nondistended, Normal bowel sounds;  Genitourinary: No CVA tenderness; Spine:  No midline CS, TS, LS tenderness. +TTP left upper lumbar paraspinal muscles. No rash.;; Extremities: Pulses normal, No tenderness, No edema, No calf edema or asymmetry.; Neuro: AA&Ox3, Major CN grossly intact.  Speech clear. No gross focal motor or sensory deficits in extremities. Strength 5/5 equal bilat UE's and LE's, including great toe dorsiflexion.  DTR 2/4 equal bilat UE's and LE's.  No gross sensory deficits.  Neg straight leg raises bilat..; Skin: Color normal, Warm, Dry.   ED Treatments / Results  Labs (all labs ordered are listed, but only abnormal results are displayed)   EKG  EKG Interpretation  Date/Time:  Wednesday August 14 2017 14:29:05 EDT Ventricular Rate:  61 PR Interval:    QRS Duration: 88 QT Interval:  426 QTC Calculation: 430 R Axis:   71 Text Interpretation:  Sinus rhythm Baseline wander When compared with ECG of 03/22/2016 No significant change was found Confirmed by Francine Graven [phone]) on 08/14/2017 2:54:36 PM       Radiology   Procedures Procedures (including critical care time)  Medications Ordered in  ED Medications  fentaNYL (SUBLIMAZE) injection 25 mcg (25 mcg Intravenous Given 08/14/17 1610)  sodium chloride 0.9 % bolus 1,000 mL (0 mLs Intravenous Stopped 08/14/17 1609)     Initial Impression / Assessment and Plan / ED Course  I have reviewed the triage vital signs and the nursing notes.  Pertinent labs & imaging results that were available during my care of the patient were reviewed by me and considered in my medical decision making (see chart for details).  MDM Reviewed: previous chart, nursing note and vitals Reviewed previous: labs and ECG Interpretation: labs, ECG, x-ray and CT scan    Results for orders placed or performed during the hospital encounter of 05/21/93  Basic metabolic panel  Result Value Ref Range   Sodium 138 135 - 145 mmol/L   Potassium 3.6 3.5 - 5.1 mmol/L   Chloride 105 101 - 111 mmol/L   CO2 25 22 - 32 mmol/L   Glucose, Bld 131 (H) 65 - 99 mg/dL   BUN 22 (H) 6 - 20 mg/dL   Creatinine, Ser 1.19 (H) 0.44 - 1.00 mg/dL   Calcium 8.4 (L) 8.9 - 10.3 mg/dL   GFR calc non Af Amer 44 (L) >60 mL/min   GFR calc Af Amer 51 (L) >60 mL/min   Anion gap 8 5 - 15  Troponin I  Result Value Ref Range   Troponin I <0.03 <0.03 ng/mL  CBC with Differential  Result Value Ref Range   WBC 5.8 4.0 - 10.5 K/uL   RBC 3.67 (L) 3.87 - 5.11 MIL/uL   Hemoglobin 11.0 (L) 12.0 - 15.0 g/dL   HCT 33.4 (L) 36.0 - 46.0 %   MCV 91.0 78.0 - 100.0 fL   MCH 30.0 26.0 - 34.0 pg   MCHC 32.9 30.0 - 36.0 g/dL   RDW 13.5 11.5 - 15.5 %   Platelets 241 150 - 400 K/uL   Neutrophils Relative % 65 %   Neutro Abs 3.7 1.7 - 7.7 K/uL   Lymphocytes Relative 20 %   Lymphs Abs 1.1 0.7 - 4.0 K/uL   Monocytes Relative 12 %   Monocytes Absolute 0.7 0.1 - 1.0 K/uL   Eosinophils Relative 3 %   Eosinophils Absolute 0.2 0.0 - 0.7 K/uL   Basophils Relative 0 %   Basophils Absolute 0.0 0.0 - 0.1 K/uL  Urinalysis, Routine w reflex microscopic  Result  Value Ref Range   Color, Urine YELLOW YELLOW    APPearance CLEAR CLEAR   Specific Gravity, Urine 1.028 1.005 - 1.030   pH 5.0 5.0 - 8.0   Glucose, UA NEGATIVE NEGATIVE mg/dL   Hgb urine dipstick NEGATIVE NEGATIVE   Bilirubin Urine NEGATIVE NEGATIVE   Ketones, ur NEGATIVE NEGATIVE mg/dL   Protein, ur NEGATIVE NEGATIVE mg/dL   Nitrite NEGATIVE NEGATIVE   Leukocytes, UA SMALL (A) NEGATIVE   RBC / HPF 0-5 0 - 5 RBC/hpf   WBC, UA 6-30 0 - 5 WBC/hpf   Bacteria, UA NONE SEEN NONE SEEN   Squamous Epithelial / LPF 0-5 (A) NONE SEEN   Mucus PRESENT   Urine rapid drug screen (hosp performed)  Result Value Ref Range   Opiates POSITIVE (A) NONE DETECTED   Cocaine NONE DETECTED NONE DETECTED   Benzodiazepines POSITIVE (A) NONE DETECTED   Amphetamines NONE DETECTED NONE DETECTED   Tetrahydrocannabinol NONE DETECTED NONE DETECTED   Barbiturates NONE DETECTED NONE DETECTED   Dg Chest 2 View Result Date: 08/14/2017 CLINICAL DATA:  Back pain. EXAM: CHEST  2 VIEW COMPARISON:  Radiographs of November 11, 2014. FINDINGS: The heart size and mediastinal contours are within normal limits. Both lungs are clear. No pneumothorax or pleural effusion is noted. The visualized skeletal structures are unremarkable. IMPRESSION: No active cardiopulmonary disease. Electronically Signed   By: Marijo Conception, M.D.   On: 08/14/2017 15:18   Ct Renal Stone Study Result Date: 08/14/2017 CLINICAL DATA:  Chronic low back pain, prior cholecystectomy EXAM: CT ABDOMEN AND PELVIS WITHOUT CONTRAST TECHNIQUE: Multidetector CT imaging of the abdomen and pelvis was performed following the standard protocol without IV contrast. COMPARISON:  None. FINDINGS: Lower chest: Lung bases are clear. Hepatobiliary: Unenhanced liver is unremarkable. Status post cholecystectomy. No intrahepatic or extrahepatic ductal dilatation. Pancreas: Within normal limits. Spleen: Within normal limits. Adrenals/Urinary Tract: Adrenal glands are within normal limits. 2 mm nonobstructing left upper pole renal  calculus (series 2/ image 33). Mild cortical thinning with segmental scarring/ atrophy of the right lower pole. No hydronephrosis. Bladder is within normal limits. Stomach/Bowel: Stomach is within normal limits. 1.7 x 2.5 cm soft tissue lesion in the right mid abdominal mesentery (series 2/image 59) with mild calcification (series 2/image 60) and tethering of adjacent loops of small bowel. This appearance is worrisome for mesenteric carcinoid. Low-lying cecum.  Appendix is not discretely visualized. Vascular/Lymphatic: No evidence of abdominal aortic aneurysm. Atherosclerotic calcifications of the abdominal aorta and branch vessels. No suspicious abdominopelvic lymphadenopathy. Reproductive: Status post hysterectomy. Bilateral ovaries are unremarkable. Other: No abdominopelvic ascites. Musculoskeletal: Postsurgical changes at L2-3. Mild degenerative changes at L3-4. IMPRESSION: 1.7 x 2.5 cm soft tissue lesion in the right mid abdominal mesentery, with tethering of adjacent loops of small bowel, worrisome for mesenteric carcinoid. No evidence of small bowel obstruction. 2 mm nonobstructing left upper pole renal calculus. No hydronephrosis. Additional ancillary findings as above. Electronically Signed   By: Julian Hy M.D.   On: 08/14/2017 16:31     1725:  BP improved after IVF. IV fentanyl given for pain with improvement. Concerning CT finding as above. T/C to Onc Dr. Talbert Cage, case discussed, including:  HPI, pertinent PM/SHx, VS/PE, dx testing, ED course and treatment:  Agreeable to f/u in office.    1800:  Pt has ambulated with steady gait. VS remain stable. Feels better after meds and wants to go home now. Dx and testing, as well as d/w Onc MD, d/w pt and family.  Questions answered.  Verb understanding, agreeable to d/c home with outpt f/u.    Final Clinical Impressions(s) / ED Diagnoses   Final diagnoses:  None    New Prescriptions New Prescriptions   No medications on file     Francine Graven, DO 08/19/17 1212

## 2017-08-14 NOTE — ED Notes (Signed)
Pt ambulated without difficulty

## 2017-08-14 NOTE — ED Notes (Signed)
Pt in NAD. Has a low BP. But other than that VSS. Lower back pain

## 2017-08-16 LAB — URINE CULTURE

## 2017-08-20 DIAGNOSIS — H353211 Exudative age-related macular degeneration, right eye, with active choroidal neovascularization: Secondary | ICD-10-CM | POA: Diagnosis not present

## 2017-08-20 DIAGNOSIS — H353122 Nonexudative age-related macular degeneration, left eye, intermediate dry stage: Secondary | ICD-10-CM | POA: Diagnosis not present

## 2017-08-26 ENCOUNTER — Encounter (HOSPITAL_COMMUNITY): Payer: Medicare Other | Attending: Oncology | Admitting: Oncology

## 2017-08-26 ENCOUNTER — Encounter (HOSPITAL_COMMUNITY): Payer: Medicare Other

## 2017-08-26 ENCOUNTER — Encounter (HOSPITAL_COMMUNITY): Payer: Self-pay | Admitting: Oncology

## 2017-08-26 VITALS — Ht 64.0 in | Wt 126.0 lb

## 2017-08-26 DIAGNOSIS — G8929 Other chronic pain: Secondary | ICD-10-CM | POA: Diagnosis not present

## 2017-08-26 DIAGNOSIS — R1909 Other intra-abdominal and pelvic swelling, mass and lump: Secondary | ICD-10-CM

## 2017-08-26 DIAGNOSIS — M549 Dorsalgia, unspecified: Secondary | ICD-10-CM | POA: Diagnosis not present

## 2017-08-26 DIAGNOSIS — R1901 Right upper quadrant abdominal swelling, mass and lump: Secondary | ICD-10-CM

## 2017-08-26 LAB — CBC WITH DIFFERENTIAL/PLATELET
BASOS PCT: 1 %
Basophils Absolute: 0.1 10*3/uL (ref 0.0–0.1)
Eosinophils Absolute: 0.1 10*3/uL (ref 0.0–0.7)
Eosinophils Relative: 2 %
HEMATOCRIT: 36 % (ref 36.0–46.0)
Hemoglobin: 11.9 g/dL — ABNORMAL LOW (ref 12.0–15.0)
Lymphocytes Relative: 18 %
Lymphs Abs: 1 10*3/uL (ref 0.7–4.0)
MCH: 29.9 pg (ref 26.0–34.0)
MCHC: 33.1 g/dL (ref 30.0–36.0)
MCV: 90.5 fL (ref 78.0–100.0)
MONO ABS: 0.6 10*3/uL (ref 0.1–1.0)
MONOS PCT: 10 %
NEUTROS ABS: 3.9 10*3/uL (ref 1.7–7.7)
Neutrophils Relative %: 69 %
Platelets: 292 10*3/uL (ref 150–400)
RBC: 3.98 MIL/uL (ref 3.87–5.11)
RDW: 13.7 % (ref 11.5–15.5)
WBC: 5.7 10*3/uL (ref 4.0–10.5)

## 2017-08-26 LAB — BASIC METABOLIC PANEL
Anion gap: 10 (ref 5–15)
BUN: 23 mg/dL — AB (ref 6–20)
CALCIUM: 9.4 mg/dL (ref 8.9–10.3)
CO2: 27 mmol/L (ref 22–32)
CREATININE: 1.29 mg/dL — AB (ref 0.44–1.00)
Chloride: 101 mmol/L (ref 101–111)
GFR calc non Af Amer: 40 mL/min — ABNORMAL LOW (ref >60)
GFR, EST AFRICAN AMERICAN: 46 mL/min — AB (ref >60)
GLUCOSE: 106 mg/dL — AB (ref 65–99)
Potassium: 5.3 mmol/L — ABNORMAL HIGH (ref 3.5–5.1)
Sodium: 138 mmol/L (ref 135–145)

## 2017-08-26 NOTE — Progress Notes (Signed)
[place] @ [facility] [phone]  Fax:([phone]) [place]: December 23, 1940  MR#: [id number]  SWF#:[id number]  Patient Care Team: Sinda Du, MD as PCP - General (Pulmonary Disease)  CHIEF COMPLAINT:  Chief Complaint  Patient presents with  . New Patient (Initial Visit)  76 year old lady who went to emergency room with increasing chronic back pain and abdominal discomfort.  According to patient she has back pain for which she has underwent MRI scan of the thoracic spine revealing multiple disc problem.  Patient has been taking now nonsteroidal medication off and on.  At upper and lower endoscopy within the last year and chronic active gastritis was found.  No other problem was detected.  Patient has a problem with bloating off and on and according to her increasing pain has become worse in last few months.  Appetite has been poor.  Has lost a significant weight.  Approximately 12-15 pounds in last few months. A CT scan was done during last emergency room visit with increasing abdominal discomfort and an abdominal mass in the right upper quadrant which was 3 x 2 cm with tethering of the a small bowel was found.  The patient has been referred to [place] for further evaluation.  Patient is accompanied with her daughter.  VISIT DIAGNOSIS:  Abdominal mass and right upper quadrant   No history exists.    Oncology Flowsheet 11/11/2015 04/19/2016  dexamethasone (DECADRON) IV - 4 mg  ondansetron (ZOFRAN) IV 4 mg 4 mg     REVIEW OF SYSTEMS:   GENERAL:  Feels weak and tired.  Has lost weight and appetite as described in history of present illness PERFORMANCE STATUS (ECOG): 1 HEENT:  No visual changes, runny nose, sore throat, mouth sores or tenderness. Lungs: No shortness of breath or cough.  No hemoptysis. Cardiac:  No chest pain, palpitations, orthopnea, or PND. GI:  No nausea, vomiting, diarrhea, constipation, melena or  hematochezia. Abdominal bloating.  Abdominal discomfort pain is intermittent.  At upper and lower endoscopy done GU:  No urgency, frequency, dysuria, or hematuria. Musculoskeletal:  No back pain.  No joint pain.  No muscle tenderness. Extremities:  No pain or swelling. Skin:  No rashes or skin changes. Neuro:  No headache, numbness or weakness, balance or coordination issues. Endocrine:  No diabetes, thyroid issues, hot flashes or night sweats. Psych:  No mood changes, depression or anxiety. Pain:  No focal pain. Review of systems:  All other systems reviewed and found to be negative.  As per HPI. Otherwise, a complete review of systems is negatve.  PAST MEDICAL HISTORY: Past Medical History:  Diagnosis Date  . Abdominal mass, right upper quadrant 08/26/2017  . Anxiety   . Chronic back pain   . Chronic sinusitis   . Depression   . Fibromyalgia   . GERD (gastroesophageal reflux disease)   . Glaucoma   . Hypothyroidism   . Macular degeneration   . PONV (postoperative nausea and vomiting)     PAST SURGICAL HISTORY: Past Surgical History:  Procedure Laterality Date  . ABDOMINAL HYSTERECTOMY     1985  . ABDOMINAL HYSTERECTOMY    . back stimulator removed  11/15/2016  . back stimulatory    . BACK SURGERY     plate and screws  . BACK SURGERY     plate in back  . CATARACT EXTRACTION W/PHACO Right 03/22/2016   Procedure: CATARACT EXTRACTION PHACO AND INTRAOCULAR LENS PLACEMENT (IOC);  Surgeon: Tonny Branch, MD;  Location: [facility];  Service: Ophthalmology;  Laterality: Right;  CDE 5.69  . CATARACT EXTRACTION W/PHACO Left 04/19/2016   Procedure: CATARACT EXTRACTION PHACO AND INTRAOCULAR LENS PLACEMENT (IOC);  Surgeon: Tonny Branch, MD;  Location: [facility];  Service: Ophthalmology;  Laterality: Left;  CDE 5.05  . CHOLECYSTECTOMY     2014, non-functioning GB  . COLONOSCOPY N/A 11/11/2015   Procedure: COLONOSCOPY;  Surgeon: Rogene Houston, MD;  Location: [facility];  Service: Endoscopy;   Laterality: N/A;  1:30  . ESOPHAGOGASTRODUODENOSCOPY N/A 12/19/2016   Procedure: ESOPHAGOGASTRODUODENOSCOPY (EGD);  Surgeon: Rogene Houston, MD;  Location: [facility];  Service: Endoscopy;  Laterality: N/A;  2:25  . TONSILLECTOMY      FAMILY HISTORY Family History  Problem Relation Age of Onset  . Allergies Mother   . Allergies Daughter   . Hodgkin's lymphoma Father   . Liver cancer Sister        "rare liver cancer"    GYNECOLOGIC HISTORY:  No LMP recorded. Patient has had a hysterectomy.     ADVANCED DIRECTIVES:    HEALTH MAINTENANCE: Social History  Substance Use Topics  . Smoking status: Never Smoker  . Smokeless tobacco: Never Used  . Alcohol use 0.0 oz/week     Comment: rare     Colonoscopy:  PAP:  Bone density:  Lipid panel:  No Known Allergies  Current Outpatient Prescriptions  Medication Sig Dispense Refill  . alprazolam (XANAX) 2 MG tablet Take 2 mg by mouth at bedtime.  5  . AMITIZA 24 MCG capsule TAKE 1 CAPSULE (24 MCG TOTAL) BY MOUTH DAILY WITH BREAKFAST. 60 capsule 3  . celecoxib (CELEBREX) 200 MG capsule Take 200 mg by mouth daily.    Marland Kitchen estradiol (CLIMARA - DOSED IN MG/24 HR) 0.025 mg/24hr patch Place 0.025 mg onto the skin every Sunday.  6  . FLUoxetine (PROZAC) 20 MG tablet Take 20 mg by mouth daily.    Marland Kitchen HYDROcodone-acetaminophen (NORCO/VICODIN) 5-325 MG per tablet Take 1 tablet by mouth every 6 (six) hours as needed for moderate pain.    Marland Kitchen levothyroxine (SYNTHROID, LEVOTHROID) 50 MCG tablet Take 50 mcg by mouth daily before breakfast.    . loratadine-pseudoephedrine (CLARITIN-D 24-HOUR) 10-240 MG 24 hr tablet Take 1 tablet by mouth daily.    . methocarbamol (ROBAXIN) 500 MG tablet Take 2 tablets (1,000 mg total) by mouth 4 (four) times daily as needed for muscle spasms (muscle spasm/pain). 25 tablet 0  . pantoprazole (PROTONIX) 40 MG tablet Take 40 mg by mouth daily.     Marland Kitchen tiZANidine (ZANAFLEX) 4 MG tablet Take 4 mg by mouth every 6 (six) hours  as needed for muscle spasms.     No current facility-administered medications for this visit.     OBJECTIVE: PHYSICAL EXAM: GENERAL:  Well developed, well nourished, sitting comfortably in the exam room in no acute distress. MENTAL STATUS:  Alert and oriented to person, place and time. HEAD:  .  Normocephalic, atraumatic, face symmetric, no Cushingoid features. EYES:  .  Pupils equal round and reactive to light and accomodation.  No conjunctivitis or scleral icterus. ENT:  Oropharynx clear without lesion.  Tongue normal. Mucous membranes moist.  RESPIRATORY:  Clear to auscultation without rales, wheezes or rhonchi. CARDIOVASCULAR:  Regular rate and rhythm without murmur, rub or gallop. Breast was not examined as patient is getting regular breast examination done and has been done recently by primary care physician ABDOMEN:  Abdominal distention.  No palpable masses.  No ascites. BACK:  No CVA tenderness.  No tenderness on percussion of the back or rib cage. SKIN:  No rashes, ulcers or lesions. EXTREMITIES: No edema, no skin discoloration or tenderness.  No palpable cords. LYMPH NODES: No palpable cervical, supraclavicular, axillary or inguinal adenopathy  NEUROLOGICAL: Unremarkable. PSYCH:  Appropriate.  There were no vitals filed for this visit.   Body mass index is 21.63 kg/m.    ECOG FS:1 - Symptomatic but completely ambulatory  LAB RESULTS:  No visits with results within 5 Day(s) from this visit.  Latest known visit with results is:  Admission on 08/14/2017, Discharged on 08/14/2017  Component Date Value Ref Range Status  . Sodium 08/14/2017 138  135 - 145 mmol/L Final  . Potassium 08/14/2017 3.6  3.5 - 5.1 mmol/L Final  . Chloride 08/14/2017 105  101 - 111 mmol/L Final  . CO2 08/14/2017 25  22 - 32 mmol/L Final  . Glucose, Bld 08/14/2017 131* 65 - 99 mg/dL Final  . BUN 08/14/2017 22* 6 - 20 mg/dL Final  . Creatinine, Ser 08/14/2017 1.19* 0.44 - 1.00 mg/dL Final  . Calcium  08/14/2017 8.4* 8.9 - 10.3 mg/dL Final  . GFR calc non Af Amer 08/14/2017 44* >60 mL/min Final  . GFR calc Af Amer 08/14/2017 51* >60 mL/min Final   Comment: (NOTE) The eGFR has been calculated using the CKD EPI equation. This calculation has not been validated in all clinical situations. eGFR's persistently <60 mL/min signify possible Chronic Kidney Disease.   . Anion gap 08/14/2017 8  5 - 15 Final  . Troponin I 08/14/2017 <0.03  <0.03 ng/mL Final  . WBC 08/14/2017 5.8  4.0 - 10.5 K/uL Final  . RBC 08/14/2017 3.67* 3.87 - 5.11 MIL/uL Final  . Hemoglobin 08/14/2017 11.0* 12.0 - 15.0 g/dL Final  . HCT 08/14/2017 33.4* 36.0 - 46.0 % Final  . MCV 08/14/2017 91.0  78.0 - 100.0 fL Final  . MCH 08/14/2017 30.0  26.0 - 34.0 pg Final  . MCHC 08/14/2017 32.9  30.0 - 36.0 g/dL Final  . RDW 08/14/2017 13.5  11.5 - 15.5 % Final  . Platelets 08/14/2017 241  150 - 400 K/uL Final  . Neutrophils Relative % 08/14/2017 65  % Final  . Neutro Abs 08/14/2017 3.7  1.7 - 7.7 K/uL Final  . Lymphocytes Relative 08/14/2017 20  % Final  . Lymphs Abs 08/14/2017 1.1  0.7 - 4.0 K/uL Final  . Monocytes Relative 08/14/2017 12  % Final  . Monocytes Absolute 08/14/2017 0.7  0.1 - 1.0 K/uL Final  . Eosinophils Relative 08/14/2017 3  % Final  . Eosinophils Absolute 08/14/2017 0.2  0.0 - 0.7 K/uL Final  . Basophils Relative 08/14/2017 0  % Final  . Basophils Absolute 08/14/2017 0.0  0.0 - 0.1 K/uL Final  . Color, Urine 08/14/2017 YELLOW  YELLOW Final  . APPearance 08/14/2017 CLEAR  CLEAR Final  . Specific Gravity, Urine 08/14/2017 1.028  1.005 - 1.030 Final  . pH 08/14/2017 5.0  5.0 - 8.0 Final  . Glucose, UA 08/14/2017 NEGATIVE  NEGATIVE mg/dL Final  . Hgb urine dipstick 08/14/2017 NEGATIVE  NEGATIVE Final  . Bilirubin Urine 08/14/2017 NEGATIVE  NEGATIVE Final  . Ketones, ur 08/14/2017 NEGATIVE  NEGATIVE mg/dL Final  . Protein, ur 08/14/2017 NEGATIVE  NEGATIVE mg/dL Final  . Nitrite 08/14/2017 NEGATIVE  NEGATIVE  Final  . Leukocytes, UA 08/14/2017 SMALL* NEGATIVE Final  . RBC / HPF 08/14/2017 0-5  0 - 5 RBC/hpf Final  .  WBC, UA 08/14/2017 6-30  0 - 5 WBC/hpf Final  . Bacteria, UA 08/14/2017 NONE SEEN  NONE SEEN Final  . Squamous Epithelial / LPF 08/14/2017 0-5* NONE SEEN Final  . Mucus 08/14/2017 PRESENT   Final  . Specimen Description 08/14/2017 URINE, CLEAN CATCH   Final  . Special Requests 08/14/2017 NONE   Final  . Culture 08/14/2017 MULTIPLE SPECIES PRESENT, SUGGEST RECOLLECTION*  Final  . Report Status 08/14/2017 08/16/2017 FINAL   Final  . Opiates 08/14/2017 POSITIVE* NONE DETECTED Final  . Cocaine 08/14/2017 NONE DETECTED  NONE DETECTED Final  . Benzodiazepines 08/14/2017 POSITIVE* NONE DETECTED Final  . Amphetamines 08/14/2017 NONE DETECTED  NONE DETECTED Final  . Tetrahydrocannabinol 08/14/2017 NONE DETECTED  NONE DETECTED Final  . Barbiturates 08/14/2017 NONE DETECTED  NONE DETECTED Final   Comment:        DRUG SCREEN FOR MEDICAL PURPOSES ONLY.  IF CONFIRMATION IS NEEDED FOR ANY PURPOSE, NOTIFY LAB WITHIN 5 DAYS.        LOWEST DETECTABLE LIMITS FOR URINE DRUG SCREEN Drug Class       Cutoff (ng/mL) Amphetamine      1000 Barbiturate      200 Benzodiazepine   341 Tricyclics       962 Opiates          300 Cocaine          300 THC              50      STUDIES: Dg Chest 2 View  Result Date: 08/14/2017 CLINICAL DATA:  Back pain. EXAM: CHEST  2 VIEW COMPARISON:  Radiographs of November 11, 2014. FINDINGS: The heart size and mediastinal contours are within normal limits. Both lungs are clear. No pneumothorax or pleural effusion is noted. The visualized skeletal structures are unremarkable. IMPRESSION: No active cardiopulmonary disease. Electronically Signed   By: Marijo Conception, M.D.   On: 08/14/2017 15:18   Ct Renal Stone Study  Result Date: 08/14/2017 CLINICAL DATA:  Chronic low back pain, prior cholecystectomy EXAM: CT ABDOMEN AND PELVIS WITHOUT CONTRAST TECHNIQUE:  Multidetector CT imaging of the abdomen and pelvis was performed following the standard protocol without IV contrast. COMPARISON:  None. FINDINGS: Lower chest: Lung bases are clear. Hepatobiliary: Unenhanced liver is unremarkable. Status post cholecystectomy. No intrahepatic or extrahepatic ductal dilatation. Pancreas: Within normal limits. Spleen: Within normal limits. Adrenals/Urinary Tract: Adrenal glands are within normal limits. 2 mm nonobstructing left upper pole renal calculus (series 2/ image 33). Mild cortical thinning with segmental scarring/ atrophy of the right lower pole. No hydronephrosis. Bladder is within normal limits. Stomach/Bowel: Stomach is within normal limits. 1.7 x 2.5 cm soft tissue lesion in the right mid abdominal mesentery (series 2/image 59) with mild calcification (series 2/image 60) and tethering of adjacent loops of small bowel. This appearance is worrisome for mesenteric carcinoid. Low-lying cecum.  Appendix is not discretely visualized. Vascular/Lymphatic: No evidence of abdominal aortic aneurysm. Atherosclerotic calcifications of the abdominal aorta and branch vessels. No suspicious abdominopelvic lymphadenopathy. Reproductive: Status post hysterectomy. Bilateral ovaries are unremarkable. Other: No abdominopelvic ascites. Musculoskeletal: Postsurgical changes at L2-3. Mild degenerative changes at L3-4. IMPRESSION: 1.7 x 2.5 cm soft tissue lesion in the right mid abdominal mesentery, with tethering of adjacent loops of small bowel, worrisome for mesenteric carcinoid. No evidence of small bowel obstruction. 2 mm nonobstructing left upper pole renal calculus. No hydronephrosis. Additional ancillary findings as above. Electronically Signed   By: Julian Hy M.D.   On:  08/14/2017 16:31    ASSESSMENT:  Right upper abdominal MESENTERIC  mass.  With chronic abdominal symptoms and chronic back pain Exact etiology and causes not known. Radiological he has been suspected to be  carcinoid disease patient's clinical symptoms profile may fit into that diagnosis however other etiology including benign condition cannot be ruled out  PLAN:  Tumor markers and 24-hour urine for 5 dL HIDA PET scan  Depending on all those results possibility of either open surgical biopsy or needle biopsy depending on a PET scan. Discussed all these findings reviewed the CT scan independently and reviewed with the patient.  Patient expressed understanding and was in agreement with this plan. She also understands that She can call clinic at any time with any questions, concerns, or complaints.    Cancer Staging No matching staging information was found for the patient.  Forest Gleason, MD   08/26/2017 1:47 PM

## 2017-08-27 LAB — CEA: CEA1: 1.1 ng/mL (ref 0.0–4.7)

## 2017-08-28 LAB — NEURON-SPECIFIC ENOLASE(NSE), BLOOD: NEURON SPECIFIC ENOLASE: 3.4 ng/mL (ref 0.0–12.5)

## 2017-09-05 DIAGNOSIS — R1901 Right upper quadrant abdominal swelling, mass and lump: Secondary | ICD-10-CM | POA: Diagnosis not present

## 2017-09-10 LAB — 5 HIAA, QUANTITATIVE, URINE, 24 HOUR
5 HIAA UR: 3.3 mg/L
5-HIAA,Quant.,24 Hr Urine: 3 mg/24 hr (ref 0.0–14.9)
Total Volume: 900

## 2017-09-11 DIAGNOSIS — H353211 Exudative age-related macular degeneration, right eye, with active choroidal neovascularization: Secondary | ICD-10-CM | POA: Diagnosis not present

## 2017-09-12 ENCOUNTER — Ambulatory Visit (HOSPITAL_COMMUNITY)
Admission: RE | Admit: 2017-09-12 | Discharge: 2017-09-12 | Disposition: A | Discharge status: Home / Self Care | Payer: Medicare Other | Source: Ambulatory Visit | Attending: Oncology | Admitting: Oncology

## 2017-09-12 DIAGNOSIS — R9389 Abnormal findings on diagnostic imaging of other specified body structures: Secondary | ICD-10-CM | POA: Insufficient documentation

## 2017-09-12 DIAGNOSIS — Z79899 Other long term (current) drug therapy: Secondary | ICD-10-CM | POA: Insufficient documentation

## 2017-09-12 DIAGNOSIS — R1901 Right upper quadrant abdominal swelling, mass and lump: Secondary | ICD-10-CM | POA: Insufficient documentation

## 2017-09-12 DIAGNOSIS — R1900 Intra-abdominal and pelvic swelling, mass and lump, unspecified site: Secondary | ICD-10-CM | POA: Diagnosis not present

## 2017-09-12 DIAGNOSIS — Z23 Encounter for immunization: Secondary | ICD-10-CM | POA: Diagnosis not present

## 2017-09-12 LAB — GLUCOSE, CAPILLARY: Glucose-Capillary: 77 mg/dL (ref 65–99)

## 2017-09-12 MED ORDER — FLUDEOXYGLUCOSE F - 18 (FDG) INJECTION
6.3700 | Freq: Once | INTRAVENOUS | Status: AC | PRN
  Administered 2017-09-12: 6.37 via INTRAVENOUS

## 2017-09-18 ENCOUNTER — Encounter (HOSPITAL_BASED_OUTPATIENT_CLINIC_OR_DEPARTMENT_OTHER): Payer: Medicare Other | Admitting: Oncology

## 2017-09-18 ENCOUNTER — Encounter (HOSPITAL_COMMUNITY): Payer: Self-pay

## 2017-09-18 VITALS — BP 117/74 | HR 66 | Temp 97.9°F | Resp 16 | Wt 127.1 lb

## 2017-09-18 DIAGNOSIS — E039 Hypothyroidism, unspecified: Secondary | ICD-10-CM

## 2017-09-18 DIAGNOSIS — R1901 Right upper quadrant abdominal swelling, mass and lump: Secondary | ICD-10-CM | POA: Diagnosis not present

## 2017-09-18 NOTE — Patient Instructions (Addendum)
[place] at [facility] Discharge Instructions  RECOMMENDATIONS MADE BY THE CONSULTANT AND ANY TEST RESULTS WILL BE SENT TO YOUR REFERRING PHYSICIAN.  You were seen today by Dr. Twana First We will get you referred to Dr. Ninfa Linden Follow up in 6 weeks   Thank you for choosing [place] at [facility] to provide your oncology and hematology care.  To afford each patient quality time with our provider, please arrive at least 15 minutes before your scheduled appointment time.    If you have a lab appointment with the [place] please come in thru the  Main Entrance and check in at the main information desk  You need to re-schedule your appointment should you arrive 10 or more minutes late.  We strive to give you quality time with our providers, and arriving late affects you and other patients whose appointments are after yours.  Also, if you no show three or more times for appointments you may be dismissed from the clinic at the providers discretion.     Again, thank you for choosing [facility].  Our hope is that these requests will decrease the amount of time that you wait before being seen by our physicians.       _____________________________________________________________  Should you have questions after your visit to [facility], please contact our office at [phone] between the hours of 8:30 a.m. and 4:30 p.m.  Voicemails left after 4:30 p.m. will not be returned until the following business day.  For prescription refill requests, have your pharmacy contact our office.       Resources For Cancer Patients and their Caregivers ? American Cancer Society: Can assist with transportation, wigs, general needs, runs Look Good Feel Better.        (216)872-0442 ? Cancer Care: Provides financial assistance, online support groups, medication/co-pay assistance.  1-800-813-HOPE 726-338-1632) ? [place] Assists [place] cancer patients and their families through emotional , educational and financial support.  248-262-8154 ? [place] DSS Where to apply for food stamps, Medicaid and utility assistance. 312-009-7094 ? [facility]: Transportation to medical appointments. 815 213 4171 ? Social Security Administration: May apply for disability if have a Stage IV cancer. 702-759-0114 7742928699 ? [employer], Disability and Transit Services: Assists with nutrition, care and transit needs. [place] Support Programs: @10RELATIVEDAYS @ > Cancer Support Group  2nd Tuesday of the month 1pm-2pm, Journey Room  > Creative Journey  3rd Tuesday of the month 1130am-1pm, Journey Room  > Look Good Feel Better  1st Wednesday of the month 10am-12 noon, Journey Room (Call [place] to register [phone])

## 2017-09-19 ENCOUNTER — Encounter (HOSPITAL_COMMUNITY): Payer: Self-pay | Admitting: Lab

## 2017-09-19 NOTE — Progress Notes (Unsigned)
Referral to [facility].  Records faxed on 10/25.  They will call patient with appt.

## 2017-09-19 NOTE — Progress Notes (Signed)
[place] @ [facility] [phone]  Fax:(336) [place] FOLLOW UP VISIT  [name] OB: 1941/07/26  MR#: [id number]  EVO#:[id number]  Patient Care Team: Brittany Du, MD as PCP - General (Pulmonary Disease)  VISIT DIAGNOSIS:  Abdominal mass and right upper quadrant  CHIEF COMPLAINT:  Chief Complaint  Patient presents with  . Follow-up    HISTORY OF PRESENT ILLNESS: 76 year old lady who went to emergency room with increasing chronic back pain and abdominal discomfort.  According to patient she has back pain for which she has underwent MRI scan of the thoracic spine revealing multiple disc problem.  Patient has been taking nonsteroidal medication off and on.  She had an upper and lower endoscopy within the last year and chronic active gastritis was found.  No other problem was detected.  Patient has a problem with bloating off and on and according to her increasing pain has become worse in last few months.  Appetite has been poor.  Has lost a significant weight.  Approximately 12-15 pounds in last few months. A CT scan was done during last emergency room visit with increasing abdominal discomfort and an abdominal mass in the right upper quadrant which was 3 x 2 cm with tethering of the a small bowel was found.  The patient has been referred to [place] for further evaluation.  Patient is accompanied with her daughter.  INTERVAL HISTORY: Patient presents with her daughter for continue follow-up today and review of recent PET scan.  She states that she feels fatigued.  Appetite is about 50%.  She has chronic mild low back pain.  She has diarrhea on and off again.  Otherwise she has no complaints.  REVIEW OF SYSTEMS:   GENERAL:  Feels weak and tired.  Has lost weight and appetite as described in history of present illness PERFORMANCE STATUS (ECOG): 1 HEENT:  No visual changes, runny nose, sore throat, mouth sores or tenderness. Lungs:  No shortness of breath or cough.  No hemoptysis. Cardiac:  No chest pain, palpitations, orthopnea, or PND. GI:  No nausea, vomiting, diarrhea, constipation, melena or hematochezia. Abdominal bloating.  Abdominal discomfort pain is intermittent.  GU:  No urgency, frequency, dysuria, or hematuria. Musculoskeletal:  + back pain.  No joint pain.  No muscle tenderness. Extremities:  No pain or swelling. Skin:  No rashes or skin changes. Neuro:  No headache, numbness or weakness, balance or coordination issues. Endocrine:  No diabetes, thyroid issues, hot flashes or night sweats. Psych:  No mood changes, depression or anxiety. Pain:  No focal pain. Review of systems:  All other systems reviewed and found to be negative.  As per HPI. Otherwise, a complete review of systems is negatve.  PAST MEDICAL HISTORY: Past Medical History:  Diagnosis Date  . Abdominal mass, right upper quadrant 08/26/2017  . Anxiety   . Chronic back pain   . Chronic sinusitis   . Depression   . Fibromyalgia   . GERD (gastroesophageal reflux disease)   . Glaucoma   . Hypothyroidism   . Macular degeneration   . PONV (postoperative nausea and vomiting)     PAST SURGICAL HISTORY: Past Surgical History:  Procedure Laterality Date  . ABDOMINAL HYSTERECTOMY     1985  . ABDOMINAL HYSTERECTOMY    . back stimulator removed  11/15/2016  . back stimulatory    . BACK SURGERY     plate and screws  . BACK SURGERY  plate in back  . CATARACT EXTRACTION W/PHACO Right 03/22/2016   Procedure: CATARACT EXTRACTION PHACO AND INTRAOCULAR LENS PLACEMENT (IOC);  Surgeon: Brittany Branch, MD;  Location: [facility];  Service: Ophthalmology;  Laterality: Right;  CDE 5.69  . CATARACT EXTRACTION W/PHACO Left 04/19/2016   Procedure: CATARACT EXTRACTION PHACO AND INTRAOCULAR LENS PLACEMENT (IOC);  Surgeon: Brittany Branch, MD;  Location: [facility];  Service: Ophthalmology;  Laterality: Left;  CDE 5.05  . CHOLECYSTECTOMY     2014, non-functioning GB    . COLONOSCOPY N/A 11/11/2015   Procedure: COLONOSCOPY;  Surgeon: Brittany Houston, MD;  Location: [facility];  Service: Endoscopy;  Laterality: N/A;  1:30  . ESOPHAGOGASTRODUODENOSCOPY N/A 12/19/2016   Procedure: ESOPHAGOGASTRODUODENOSCOPY (EGD);  Surgeon: Brittany Houston, MD;  Location: [facility];  Service: Endoscopy;  Laterality: N/A;  2:25  . TONSILLECTOMY      FAMILY HISTORY Family History  Problem Relation Age of Onset  . Allergies Mother   . Allergies Daughter   . Hodgkin's lymphoma Father   . Liver cancer Sister        "rare liver cancer"    GYNECOLOGIC HISTORY:  No LMP recorded. Patient has had a hysterectomy.     ADVANCED DIRECTIVES:    HEALTH MAINTENANCE: Social History  Substance Use Topics  . Smoking status: Never Smoker  . Smokeless tobacco: Never Used  . Alcohol use 0.0 oz/week     Comment: rare     Colonoscopy:  PAP:  Bone density:  Lipid panel:  No Known Allergies  Current Outpatient Prescriptions  Medication Sig Dispense Refill  . alprazolam (XANAX) 2 MG tablet Take 2 mg by mouth at bedtime.  5  . AMITIZA 24 MCG capsule TAKE 1 CAPSULE (24 MCG TOTAL) BY MOUTH DAILY WITH BREAKFAST. 60 capsule 3  . celecoxib (CELEBREX) 200 MG capsule Take 200 mg by mouth daily.    Marland Kitchen estradiol (CLIMARA - DOSED IN MG/24 HR) 0.025 mg/24hr patch Place 0.025 mg onto the skin every Sunday.  6  . FLUoxetine (PROZAC) 20 MG tablet Take 20 mg by mouth daily.    Marland Kitchen HYDROcodone-acetaminophen (NORCO/VICODIN) 5-325 MG per tablet Take 1 tablet by mouth every 6 (six) hours as needed for moderate pain.    Marland Kitchen levothyroxine (SYNTHROID, LEVOTHROID) 50 MCG tablet Take 50 mcg by mouth daily before breakfast.    . loratadine-pseudoephedrine (CLARITIN-D 24-HOUR) 10-240 MG 24 hr tablet Take 1 tablet by mouth daily.    . methocarbamol (ROBAXIN) 500 MG tablet Take 2 tablets (1,000 mg total) by mouth 4 (four) times daily as needed for muscle spasms (muscle spasm/pain). 25 tablet 0  .  pantoprazole (PROTONIX) 40 MG tablet Take 40 mg by mouth daily.     Marland Kitchen tiZANidine (ZANAFLEX) 4 MG tablet Take 4 mg by mouth every 6 (six) hours as needed for muscle spasms.     No current facility-administered medications for this visit.     OBJECTIVE: PHYSICAL EXAM: Constitutional: Well-developed, well-nourished, and in no distress.   HENT:  Head: Normocephalic and atraumatic.  Mouth/Throat: No oropharyngeal exudate. Mucosa moist. Eyes: Pupils are equal, round, and reactive to light. Conjunctivae are normal. No scleral icterus.  Neck: Normal range of motion. Neck supple. No JVD present.  Cardiovascular: Normal rate, regular rhythm and normal heart sounds.  Exam reveals no gallop and no friction rub.   No murmur heard. Pulmonary/Chest: Effort normal and breath sounds normal. No respiratory distress. No wheezes.No rales.  Abdominal: Soft. Bowel sounds are normal. No  distension. Mild TTP over lower quadrants of abdomen. There is no guarding.  Musculoskeletal: No edema or tenderness.  Lymphadenopathy:    No cervical or supraclavicular adenopathy.  Neurological: Alert and oriented to person, place, and time. No cranial nerve deficit.  Skin: Skin is warm and dry. No rash noted. No erythema. No pallor.  Psychiatric: Affect and judgment normal.    Vitals:   09/18/17 1439  BP: 117/74  Pulse: 66  Resp: 16  Temp: 97.9 F (36.6 C)  SpO2: 100%     Body mass index is 21.82 kg/m.    ECOG FS:1 - Symptomatic but completely ambulatory  LAB RESULTS:  No visits with results within 5 Day(s) from this visit.  Latest known visit with results is:  Hospital Outpatient Visit on 09/12/2017  Component Date Value Ref Range Status  . Glucose-Capillary 09/12/2017 77  65 - 99 mg/dL Final     STUDIES: Nm Pet Image Initial (pi) Skull Base To Thigh  Result Date: 09/12/2017 CLINICAL DATA:  Initial treatment strategy for abdominal mass. EXAM: NUCLEAR MEDICINE PET SKULL BASE TO THIGH TECHNIQUE: 6.4 mCi  F-18 FDG was injected intravenously. Full-ring PET imaging was performed from the skull base to thigh after the radiotracer. CT data was obtained and used for attenuation correction and anatomic localization. FASTING BLOOD GLUCOSE:  Value: 77 mg/dl COMPARISON:  None. FINDINGS: NECK No hypermetabolic lymph nodes in the neck. CHEST No hypermetabolic mediastinal or hilar nodes. No suspicious pulmonary nodules on the CT scan. ABDOMEN/PELVIS Retractile processes in the mesenteries of the RIGHT lower quadrant Retractile mesenteric process the RIGHT lower quadrant is again demonstrated. Central solid component measures approximately 15 mm by 10 mm with a small calcification (image 149, series 4). This lesion has mild metabolic activity with SUV max equal 3.5 which is similar to adjacent bowel. There is more intense uptake within the distal ileum and cecum which is not atypical. No abnormal uptake within the liver. There is a focus of uptake the the second portion duodenum with SUV max equal 6.1. No hypermetabolic abdominopelvic lymph nodes. No enlarged abdominopelvic lymph nodes. Post hysterectomy anatomy SKELETON No focal hypermetabolic activity to suggest skeletal metastasis. IMPRESSION: 1. Mild metabolic activity associated with the small mesenteric mass within the RIGHT lower quadrant with retractile pattern in the adjacent mesenteries. Findings remain concerning for carcinoid tumor. Of note well differentiated neuroendocrine tumors can have low blood metabolic activity (low FDG activity). Consider Ga 68 DOTATATE imaging for well differentiated neuroendocrine tumors. 2. Metabolic activity in the distal ileum and cecum is nonspecific. No mass lesion identified. 3. No evidence of hepatic metastasis. 4. Activity in the second portion duodenum is nonspecific. No IV oral contrast. 5. Consider Ga 68 DOTATATE PET-CT scan for well differentiated neuroendocrine tumor. Electronically Signed   By: Suzy Bouchard M.D.   On:  09/12/2017 11:46    ASSESSMENT:  Right upper abdominal mesenteric mass suspicious for carcinoid tumor.  PLAN:   -I have reviewed patient's labs and PET scan in detail with her and her daughter today.   -24 hr 5-HIAA was within normal limits, therefore low suspicion for pheochromocytoma.   -PET scan revealed mild metabolic activity associated with the small mesenteric mass within the RIGHT lower quadrant with retractile pattern in the adjacent mesenteries. Findings remain concerning for carcinoid tumor. Of note well differentiated neuroendocrine tumors can have low blood metabolic activity (low FDG activity). A copy of the PET was given to the patient.  -I have discussed biopsy of mass vs.  proceeding with surgical excision since it is still small. Surgical excision would be curative for the patient since she has no evidence of metastatic disease anywhere else. She has agreed with surgical evaluation. I will send her to Dr. Ninfa Linden per her preferences.   -RTC in 6 weeks for follow up.    -Patient expressed understanding and was in agreement with this plan. She also understands that She can call clinic at any time with any questions, concerns, or complaints.     Twana First, MD   09/19/2017 9:32 AM

## 2017-09-25 ENCOUNTER — Emergency Department (HOSPITAL_COMMUNITY): Payer: Medicare Other

## 2017-09-25 ENCOUNTER — Emergency Department (HOSPITAL_COMMUNITY)
Admission: EM | Admit: 2017-09-25 | Discharge: 2017-09-26 | Disposition: A | Discharge status: Home / Self Care | Payer: Medicare Other | Attending: Emergency Medicine | Admitting: Emergency Medicine

## 2017-09-25 ENCOUNTER — Encounter (HOSPITAL_COMMUNITY): Payer: Self-pay | Admitting: Emergency Medicine

## 2017-09-25 DIAGNOSIS — E039 Hypothyroidism, unspecified: Secondary | ICD-10-CM | POA: Diagnosis not present

## 2017-09-25 DIAGNOSIS — G8929 Other chronic pain: Secondary | ICD-10-CM | POA: Diagnosis not present

## 2017-09-25 DIAGNOSIS — R52 Pain, unspecified: Secondary | ICD-10-CM | POA: Diagnosis not present

## 2017-09-25 DIAGNOSIS — Z79899 Other long term (current) drug therapy: Secondary | ICD-10-CM | POA: Insufficient documentation

## 2017-09-25 DIAGNOSIS — M545 Low back pain: Secondary | ICD-10-CM | POA: Diagnosis not present

## 2017-09-25 DIAGNOSIS — M5489 Other dorsalgia: Secondary | ICD-10-CM | POA: Diagnosis not present

## 2017-09-25 MED ORDER — HYDROMORPHONE HCL 1 MG/ML IJ SOLN
1.0000 mg | Freq: Once | INTRAMUSCULAR | Status: AC
  Administered 2017-09-25: 1 mg via INTRAMUSCULAR
  Filled 2017-09-25: qty 1

## 2017-09-25 MED ORDER — DIAZEPAM 5 MG PO TABS
5.0000 mg | ORAL_TABLET | Freq: Once | ORAL | Status: AC
  Administered 2017-09-25: 5 mg via ORAL
  Filled 2017-09-25: qty 1

## 2017-09-25 NOTE — ED Triage Notes (Addendum)
Pt C/O increased back pain since this morning. Pt took her 5-325 Vicodin at 2000 and her Dilauded PO at 2100. Pt unsure of the mg of her dilauded. Pt seen here for the same 2 weeks ago and diagnosed with mass on her colon.

## 2017-09-25 NOTE — ED Provider Notes (Signed)
[facility] EMERGENCY DEPARTMENT Provider Note   CSN: [id number] Arrival date & time: 09/25/17  2256     History   Chief Complaint Chief Complaint  Patient presents with  . Back Pain    HPI [name] is a 76 y.o. female.  Patient presents via EMS with exacerbation of her chronic back pain.  States she has had pain in her mid and low back for "20 years" that she normally takes Lortab for.  She was recently prescribed Dilaudid by Dr. Luan Pulling to take as needed.  She did take one tonight but for the first time without relief.  Notably she was able to go trick-or-treating tonight and her husband thinks that she overdid it.  She denies any falls or trauma.  She denies any change in her chronic pain pattern.  No focal weakness, numbness or tingling.  She states she does not have sciatica.  Has had one back surgery remotely.  No bowel or bladder incontinence or saddle anesthesia.  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  She has since seen oncology and had a PET scan.  She reports that she is going to be scheduled for surgery to have this removed does not have any metastatic disease.  She denies any fevers or vomiting.  She denies any dysuria hematuria.  She states she has not had a bowel movement for the past 4 days.   The history is provided by the patient and the EMS personnel.  Back Pain   Pertinent negatives include no chest pain, no fever, no headaches, no abdominal pain, no dysuria and no weakness.    Past Medical History:  Diagnosis Date  . Abdominal mass, right upper quadrant 08/26/2017  . Anxiety   . Chronic back pain   . Chronic sinusitis   . Depression   . Fibromyalgia   . GERD (gastroesophageal reflux disease)   . Glaucoma   . Hypothyroidism   . Macular degeneration   . PONV (postoperative nausea and vomiting)     Patient Active Problem List   Diagnosis Date Noted  . Abdominal mass,  right upper quadrant 08/26/2017  . Early satiety 10/23/2016  . Bloating 10/23/2016  . GERD (gastroesophageal reflux disease) 10/10/2016  . Chronic back pain 10/10/2016  . Cough 02/03/2015    Past Surgical History:  Procedure Laterality Date  . ABDOMINAL HYSTERECTOMY     1985  . ABDOMINAL HYSTERECTOMY    . back stimulator removed  11/15/2016  . back stimulatory    . BACK SURGERY     plate and screws  . BACK SURGERY     plate in back  . CATARACT EXTRACTION W/PHACO Right 03/22/2016   Procedure: CATARACT EXTRACTION PHACO AND INTRAOCULAR LENS PLACEMENT (IOC);  Surgeon: Tonny Branch, MD;  Location: [facility];  Service: Ophthalmology;  Laterality: Right;  CDE 5.69  . CATARACT EXTRACTION W/PHACO Left 04/19/2016   Procedure: CATARACT EXTRACTION PHACO AND INTRAOCULAR LENS PLACEMENT (IOC);  Surgeon: Tonny Branch, MD;  Location: [facility];  Service: Ophthalmology;  Laterality: Left;  CDE 5.05  . CHOLECYSTECTOMY     2014, non-functioning GB  . COLONOSCOPY N/A 11/11/2015   Procedure: COLONOSCOPY;  Surgeon: Rogene Houston, MD;  Location: [facility];  Service: Endoscopy;  Laterality: N/A;  1:30  . ESOPHAGOGASTRODUODENOSCOPY N/A 12/19/2016   Procedure: ESOPHAGOGASTRODUODENOSCOPY (EGD);  Surgeon: Rogene Houston, MD;  Location: [facility];  Service: Endoscopy;  Laterality: N/A;  2:25  . TONSILLECTOMY      OB History    No data available       Home Medications    Prior to Admission medications   Medication Sig Start Date End Date Taking? Authorizing Provider  alprazolam Duanne Moron) 2 MG tablet Take 2 mg by mouth at bedtime. 07/23/17  Yes [provider]  celecoxib (CELEBREX) 200 MG capsule Take 200 mg by mouth daily.   Yes [provider]  estradiol (CLIMARA - DOSED IN MG/24 HR) 0.025 mg/24hr patch Place 0.025 mg onto the skin every Sunday. 11/28/16  Yes [provider]  FLUoxetine (PROZAC) 20 MG tablet Take 20 mg by mouth daily.   Yes [provider]    HYDROcodone-acetaminophen (NORCO/VICODIN) 5-325 MG per tablet Take 1 tablet by mouth every 6 (six) hours as needed for moderate pain.   Yes [provider]  levothyroxine (SYNTHROID, LEVOTHROID) 50 MCG tablet Take 50 mcg by mouth daily before breakfast.   Yes [provider]  loratadine-pseudoephedrine (CLARITIN-D 24-HOUR) 10-240 MG 24 hr tablet Take 1 tablet by mouth daily.   Yes [provider]  pantoprazole (PROTONIX) 40 MG tablet Take 40 mg by mouth daily.    Yes [provider]  AMITIZA 24 MCG capsule TAKE 1 CAPSULE (24 MCG TOTAL) BY MOUTH DAILY WITH BREAKFAST. 06/27/17   Setzer, Rona Ravens, NP  methocarbamol (ROBAXIN) 500 MG tablet Take 2 tablets (1,000 mg total) by mouth 4 (four) times daily as needed for muscle spasms (muscle spasm/pain). 08/14/17   Francine Graven, DO  tiZANidine (ZANAFLEX) 4 MG tablet Take 4 mg by mouth every 6 (six) hours as needed for muscle spasms.    [provider]    Family History Family History  Problem Relation Age of Onset  . Allergies Mother   . Allergies Daughter   . Hodgkin's lymphoma Father   . Liver cancer Sister        "rare liver cancer"    Social History Social History  Substance Use Topics  . Smoking status: Never Smoker  . Smokeless tobacco: Never Used  . Alcohol use 0.0 oz/week     Comment: rare     Allergies   Patient has no known allergies.   Review of Systems Review of Systems  Constitutional: Negative for activity change, appetite change, fatigue and fever.  HENT: Negative for congestion.   Respiratory: Negative for chest tightness and shortness of breath.   Cardiovascular: Negative for chest pain.  Gastrointestinal: Negative for abdominal pain, nausea and vomiting.  Genitourinary: Negative for dysuria and hematuria.  Musculoskeletal: Positive for back pain. Negative for arthralgias, myalgias and neck pain.  Skin: Negative for rash.  Neurological: Negative for dizziness, weakness  and headaches.    all other systems are negative except as noted in the HPI and PMH.    Physical Exam Updated Vital Signs BP (!) 154/66 (BP Location: Right Arm)   Pulse 64   Temp (!) 97.5 F (36.4 C) (Oral)   Resp 16   Ht 5\' 4"  (1.626 m)   Wt 57.6 kg (127 lb)   SpO2 100%   BMI 21.80 kg/m   Physical Exam  Constitutional: She is oriented to person, place, and time. She appears well-developed and well-nourished. No distress.  HENT:  Head: Normocephalic and atraumatic.  Mouth/Throat: Oropharynx is clear and moist. No oropharyngeal exudate.  Eyes: Pupils are equal, round, and reactive to light. Conjunctivae and EOM are normal.  Neck: Normal range  of motion. Neck supple.  No meningismus.  Cardiovascular: Normal rate, regular rhythm, normal heart sounds and intact distal pulses.   No murmur heard. Pulmonary/Chest: Effort normal and breath sounds normal. No respiratory distress.  Abdominal: Soft. There is no tenderness. There is no rebound and no guarding.  Musculoskeletal: Normal range of motion. She exhibits tenderness. She exhibits no edema.  Midline T and L-spine tenderness without step-off or deformity.  No CVA tenderness 5/5 strength in bilateral lower extremities. Ankle plantar and dorsiflexion intact. Great toe extension intact bilaterally. +2 DP and PT pulses. +2 patellar reflexes bilaterally. Normal gait.   Neurological: She is alert and oriented to person, place, and time. No cranial nerve deficit. She exhibits normal muscle tone. Coordination normal.   5/5 strength throughout. CN 2-12 intact.Equal grip strength.   Skin: Skin is warm.  Psychiatric: She has a normal mood and affect. Her behavior is normal.  Nursing note and vitals reviewed.    ED Treatments / Results  Labs (all labs ordered are listed, but only abnormal results are displayed) Labs Reviewed  URINALYSIS, ROUTINE W REFLEX MICROSCOPIC - Abnormal; Notable for the following:       Result Value   Color,  Urine AMBER (*)    APPearance CLOUDY (*)    Ketones, ur 5 (*)    Protein, ur 30 (*)    Leukocytes, UA MODERATE (*)    Bacteria, UA RARE (*)    Squamous Epithelial / LPF 6-30 (*)    All other components within normal limits    EKG  EKG Interpretation None       Radiology No results found.  Procedures Procedures (including critical care time)  Medications Ordered in ED Medications  HYDROmorphone (DILAUDID) injection 1 mg (not administered)  diazepam (VALIUM) tablet 5 mg (not administered)     Initial Impression / Assessment and Plan / ED Course  I have reviewed the triage vital signs and the nursing notes.  Pertinent labs & imaging results that were available during my care of the patient were reviewed by me and considered in my medical decision making (see chart for details).    Patient with acute on chronic back pain in similar location and similar intensity.  No recent trauma or fall.  No sciatica symptoms.  No focal weakness, bowel bladder incontinence, fever or vomiting  Patient given IM injection for pain.  She is able to ambulate without difficulty.  Low suspicion for cord compression or cauda equina.  Do not believe her back pain is related to her newly diagnosed carcinoid tumor.  The patient has her pain medication at home.  Low suspicion for acute emergent neurosurgical process.  She is tolerating p.o. and amatory and requesting discharge.  Patient to follow-up with Dr. Luan Pulling.  Return precautions discussed.  Final Clinical Impressions(s) / ED Diagnoses   Final diagnoses:  Chronic bilateral low back pain without sciatica    New Prescriptions New Prescriptions   No medications on file     Ezequiel Essex, MD 09/26/17 [phone]

## 2017-09-26 DIAGNOSIS — M545 Low back pain: Secondary | ICD-10-CM | POA: Diagnosis not present

## 2017-09-26 LAB — URINALYSIS, ROUTINE W REFLEX MICROSCOPIC
Bilirubin Urine: NEGATIVE
Glucose, UA: NEGATIVE mg/dL
Hgb urine dipstick: NEGATIVE
KETONES UR: 5 mg/dL — AB
Nitrite: NEGATIVE
PROTEIN: 30 mg/dL — AB
Specific Gravity, Urine: 1.026 (ref 1.005–1.030)
TRANS EPITHEL UA: 2
pH: 5 (ref 5.0–8.0)

## 2017-09-26 MED ORDER — METHOCARBAMOL 500 MG PO TABS: 500.0000 mg | ORAL_TABLET | Freq: Two times a day (BID) | ORAL | 0 refills | Status: DC

## 2017-09-26 NOTE — ED Notes (Signed)
Pt ambulatory to waiting room. Pt verbalized understanding of discharge instructions.   

## 2017-09-26 NOTE — ED Notes (Signed)
Patient walked to restroom with no complaints. Patient states that she is ready to go. Pain is a lot better.

## 2017-09-26 NOTE — Discharge Instructions (Signed)
Follow-up with Dr. Luan Pulling for adjustments of your pain medication regimen.  You may need to see a spine or pain specialist.  Return to the ED if you develop worsening pain, fever, chills, weakness, numbness, incontinence or any other concerns

## 2017-10-01 DIAGNOSIS — K668 Other specified disorders of peritoneum: Secondary | ICD-10-CM | POA: Diagnosis not present

## 2017-10-02 ENCOUNTER — Other Ambulatory Visit (HOSPITAL_COMMUNITY): Payer: Self-pay | Admitting: Oncology

## 2017-10-02 DIAGNOSIS — Z23 Encounter for immunization: Secondary | ICD-10-CM | POA: Diagnosis not present

## 2017-10-02 DIAGNOSIS — R1901 Right upper quadrant abdominal swelling, mass and lump: Secondary | ICD-10-CM

## 2017-10-03 ENCOUNTER — Other Ambulatory Visit: Payer: Self-pay | Admitting: Radiology

## 2017-10-04 ENCOUNTER — Other Ambulatory Visit: Payer: Self-pay | Admitting: Radiology

## 2017-10-07 ENCOUNTER — Encounter (HOSPITAL_COMMUNITY): Payer: Self-pay

## 2017-10-07 ENCOUNTER — Ambulatory Visit (HOSPITAL_COMMUNITY)
Admission: RE | Admit: 2017-10-07 | Discharge: 2017-10-07 | Disposition: A | Discharge status: Home / Self Care | Payer: Medicare Other | Source: Ambulatory Visit | Attending: Radiology | Admitting: Radiology

## 2017-10-07 ENCOUNTER — Ambulatory Visit (HOSPITAL_COMMUNITY)
Admission: RE | Admit: 2017-10-07 | Discharge: 2017-10-07 | Disposition: A | Discharge status: Home / Self Care | Payer: Medicare Other | Source: Ambulatory Visit | Attending: Oncology | Admitting: Oncology

## 2017-10-07 DIAGNOSIS — R1901 Right upper quadrant abdominal swelling, mass and lump: Secondary | ICD-10-CM | POA: Insufficient documentation

## 2017-10-07 DIAGNOSIS — R109 Unspecified abdominal pain: Secondary | ICD-10-CM | POA: Diagnosis not present

## 2017-10-07 DIAGNOSIS — R1907 Generalized intra-abdominal and pelvic swelling, mass and lump: Secondary | ICD-10-CM | POA: Diagnosis not present

## 2017-10-07 MED ORDER — SODIUM CHLORIDE 0.9 % IV SOLN: INTRAVENOUS | Status: DC

## 2017-10-07 NOTE — H&P (Signed)
Chief Complaint: Patient was seen in consultation today for right mesenteric mass biopsy at the request of Zhou,Louise  Referring Physician(s): Zhou,Louise  Supervising Physician: Aletta Edouard  Patient Status: [facility] - Out-pt  History of Present Illness: [name] is a 76 y.o. female   Pt was seen in ED for abd pain and back pain 07/2017 Wt loss CT then revealed incidental abdominal mass  08/14/17: IMPRESSION: 1.7 x 2.5 cm soft tissue lesion in the right mid abdominal mesentery, with tethering of adjacent loops of small bowel, worrisome for mesenteric carcinoid. No evidence of small bowel obstruction. 2 mm nonobstructing left upper pole renal calculus. No hydronephrosis  PET 09/12/17: IMPRESSION: 1. Mild metabolic activity associated with the small mesenteric mass within the RIGHT lower quadrant with retractile pattern in the adjacent mesenteries. Findings remain concerning for carcinoid tumor. Of note well differentiated neuroendocrine tumors can have low blood metabolic activity (low FDG activity). Consider Ga 68 DOTATATE imaging for well differentiated neuroendocrine tumors. 2. Metabolic activity in the distal ileum and cecum is nonspecific. No mass lesion identified. 3. No evidence of hepatic metastasis. 4. Activity in the second portion duodenum is nonspecific. No IV oral contrast. 5. Consider Ga 68 DOTATATE PET-CT scan for well differentiated neuroendocrine tumor  Referred and evaluated by Dr Talbert Cage Now request for mesenteric mass biopsy  Past Medical History:  Diagnosis Date  . Abdominal mass, right upper quadrant 08/26/2017  . Anxiety   . Chronic back pain   . Chronic sinusitis   . Depression   . Fibromyalgia   . GERD (gastroesophageal reflux disease)   . Glaucoma   . Hypothyroidism   . Macular degeneration   . PONV (postoperative nausea and vomiting)     Past Surgical History:  Procedure Laterality Date  . ABDOMINAL HYSTERECTOMY     1985    . ABDOMINAL HYSTERECTOMY    . back stimulator removed  11/15/2016  . back stimulatory    . BACK SURGERY     plate and screws  . BACK SURGERY     plate in back  . CHOLECYSTECTOMY     2014, non-functioning GB  . TONSILLECTOMY      Allergies: Patient has no known allergies.  Medications: Prior to Admission medications   Medication Sig Start Date End Date Taking? Authorizing Provider  ALPRAZolam Duanne Moron) 1 MG tablet Take 1 mg at bedtime by mouth.  07/23/17  Yes [provider]  AMITIZA 24 MCG capsule TAKE 1 CAPSULE (24 MCG TOTAL) BY MOUTH DAILY WITH BREAKFAST. 06/27/17  Yes Setzer, Terri L, NP  aspirin EC 81 MG tablet Take 81 mg daily by mouth.   Yes [provider]  celecoxib (CELEBREX) 200 MG capsule Take 200 mg by mouth daily.   Yes [provider]  estradiol (CLIMARA - DOSED IN MG/24 HR) 0.025 mg/24hr patch Place 0.025 mg onto the skin every Sunday. 11/28/16  Yes [provider]  FLUoxetine (PROZAC) 20 MG tablet Take 20 mg by mouth daily.   Yes [provider]  HYDROcodone-acetaminophen (NORCO/VICODIN) 5-325 MG per tablet Take 1 tablet 3 (three) times daily as needed by mouth for moderate pain.    Yes [provider]  levothyroxine (SYNTHROID, LEVOTHROID) 50 MCG tablet Take 50 mcg by mouth daily before breakfast.   Yes [provider]  loratadine-pseudoephedrine (CLARITIN-D 24-HOUR) 10-240 MG 24 hr tablet Take 1 tablet daily as needed by mouth for allergies.    Yes [provider]  OVER THE COUNTER MEDICATION  Take 2 capsules daily by mouth. Premiere Formula Ocular Nutrition   Yes [provider]  OVER THE COUNTER MEDICATION Take 1 capsule daily by mouth. Probiotic Chime Supplement   Yes [provider]  pantoprazole (PROTONIX) 40 MG tablet Take 40 mg by mouth daily.    Yes [provider]     Family History  Problem Relation Age of Onset  . Allergies Mother   . Allergies Daughter   .  Hodgkin's lymphoma Father   . Liver cancer Sister        "rare liver cancer"    Social History   Socioeconomic History  . Marital status: Married    Spouse name: None  . Number of children: None  . Years of education: None  . Highest education level: None  Social Needs  . Financial resource strain: None  . Food insecurity - worry: None  . Food insecurity - inability: None  . Transportation needs - medical: None  . Transportation needs - non-medical: None  Occupational History  . Occupation: Retired   Tobacco Use  . Smoking status: Never Smoker  . Smokeless tobacco: Never Used  Substance and Sexual Activity  . Alcohol use: Yes    Alcohol/week: 0.0 oz    Comment: rare  . Drug use: No  . Sexual activity: Yes    Birth control/protection: Surgical  Other Topics Concern  . None  Social History Narrative  . None    Review of Systems: A 12 point ROS discussed and pertinent positives are indicated in the HPI above.  All other systems are negative.  Review of Systems  Constitutional: Positive for appetite change and unexpected weight change. Negative for activity change, fatigue and fever.  Respiratory: Negative for shortness of breath.   Cardiovascular: Negative for chest pain.  Gastrointestinal: Positive for abdominal pain.  Musculoskeletal: Positive for back pain. Negative for gait problem.  Neurological: Negative for weakness.  Psychiatric/Behavioral: Negative for behavioral problems and confusion.    Vital Signs: BP 124/68   Pulse 72   Temp 97.6 F (36.4 C) (Oral)   Ht 5\' 4"  (1.626 m)   Wt 127 lb (57.6 kg)   SpO2 100%   BMI 21.80 kg/m   Physical Exam  Constitutional: She is oriented to person, place, and time.  Cardiovascular: Normal rate, regular rhythm and normal heart sounds.  Pulmonary/Chest: Effort normal and breath sounds normal.  Abdominal: Soft. Bowel sounds are normal.  Musculoskeletal: Normal range of motion.  Neurological: She is alert and  oriented to person, place, and time.  Skin: Skin is warm and dry.  Psychiatric: She has a normal mood and affect. Her behavior is normal. Judgment and thought content normal.  Nursing note and vitals reviewed.   Imaging: Nm Pet Image Initial (pi) Skull Base To Thigh  Result Date: 09/12/2017 CLINICAL DATA:  Initial treatment strategy for abdominal mass. EXAM: NUCLEAR MEDICINE PET SKULL BASE TO THIGH TECHNIQUE: 6.4 mCi F-18 FDG was injected intravenously. Full-ring PET imaging was performed from the skull base to thigh after the radiotracer. CT data was obtained and used for attenuation correction and anatomic localization. FASTING BLOOD GLUCOSE:  Value: 77 mg/dl COMPARISON:  None. FINDINGS: NECK No hypermetabolic lymph nodes in the neck. CHEST No hypermetabolic mediastinal or hilar nodes. No suspicious pulmonary nodules on the CT scan. ABDOMEN/PELVIS Retractile processes in the mesenteries of the RIGHT lower quadrant Retractile mesenteric process the RIGHT lower quadrant is again demonstrated. Central solid component measures approximately 15 mm by 10  mm with a small calcification (image 149, series 4). This lesion has mild metabolic activity with SUV max equal 3.5 which is similar to adjacent bowel. There is more intense uptake within the distal ileum and cecum which is not atypical. No abnormal uptake within the liver. There is a focus of uptake the the second portion duodenum with SUV max equal 6.1. No hypermetabolic abdominopelvic lymph nodes. No enlarged abdominopelvic lymph nodes. Post hysterectomy anatomy SKELETON No focal hypermetabolic activity to suggest skeletal metastasis. IMPRESSION: 1. Mild metabolic activity associated with the small mesenteric mass within the RIGHT lower quadrant with retractile pattern in the adjacent mesenteries. Findings remain concerning for carcinoid tumor. Of note well differentiated neuroendocrine tumors can have low blood metabolic activity (low FDG activity).  Consider Ga 68 DOTATATE imaging for well differentiated neuroendocrine tumors. 2. Metabolic activity in the distal ileum and cecum is nonspecific. No mass lesion identified. 3. No evidence of hepatic metastasis. 4. Activity in the second portion duodenum is nonspecific. No IV oral contrast. 5. Consider Ga 68 DOTATATE PET-CT scan for well differentiated neuroendocrine tumor. Electronically Signed   By: Suzy Bouchard M.D.   On: 09/12/2017 11:46    Labs:  CBC: Recent Labs    08/14/17 1441 08/26/17 1424  WBC 5.8 5.7  HGB 11.0* 11.9*  HCT 33.4* 36.0  PLT 241 292    COAGS: No results for input(s): INR, APTT in the last 8760 hours.  BMP: Recent Labs    08/14/17 1441 08/26/17 1424  NA 138 138  K 3.6 5.3*  CL 105 101  CO2 25 27  GLUCOSE 131* 106*  BUN 22* 23*  CALCIUM 8.4* 9.4  CREATININE 1.19* 1.29*  GFRNONAA 44* 40*  GFRAA 51* 46*    LIVER FUNCTION TESTS: No results for input(s): BILITOT, AST, ALT, ALKPHOS, PROT, ALBUMIN in the last 8760 hours.  TUMOR MARKERS: No results for input(s): AFPTM, CEA, CA199, CHROMGRNA in the last 8760 hours.  Assessment and Plan:  Right mesenteric mass  Scheduled for biopsy today Risks and benefits discussed with the patient including, but not limited to bleeding, infection, damage to adjacent structures or low yield requiring additional tests. All of the patient's questions were answered, patient is agreeable to proceed. Consent signed and in chart.  ADDENDUM: Dr Kathlene Cote has reviewed imaging---will get CT Abd Pelvis without contrast now. Will review with him and determine if move ahead with biopsy Pt and family aware and agreeable  Thank you for this interesting consult.  I greatly enjoyed meeting TRYPHENA PERKOVICH and look forward to participating in their care.  A copy of this report was sent to the requesting provider on this date.  Electronically Signed: Lavonia Drafts, PA-C 10/07/2017, 9:37 AM   I spent a total of  30 Minutes    in face to face in clinical consultation, greater than 50% of which was counseling/coordinating care for mesenteric mass biopsy

## 2017-10-08 ENCOUNTER — Telehealth (HOSPITAL_COMMUNITY): Payer: Self-pay | Admitting: Emergency Medicine

## 2017-10-08 NOTE — Telephone Encounter (Signed)
Called pt to let her know that Dr Talbert Cage would like her to go back to see Dr Barry Dienes to proceed with surgical resection of her suspected carcinoid mass.  She states that she seen Dr Marcello Moores but would not like to see her again.  She has seen Dr Marlou Starks and he took her gallbladder out and she would like to see him again .  The phone number was given to her for [facility] and she said she was going to call and make an appt to see Dr Marlou Starks.

## 2017-10-09 DIAGNOSIS — H353122 Nonexudative age-related macular degeneration, left eye, intermediate dry stage: Secondary | ICD-10-CM | POA: Diagnosis not present

## 2017-10-09 DIAGNOSIS — H353211 Exudative age-related macular degeneration, right eye, with active choroidal neovascularization: Secondary | ICD-10-CM | POA: Diagnosis not present

## 2017-10-11 DIAGNOSIS — K668 Other specified disorders of peritoneum: Secondary | ICD-10-CM | POA: Diagnosis not present

## 2017-10-15 ENCOUNTER — Ambulatory Visit: Payer: Self-pay | Admitting: General Surgery

## 2017-10-25 ENCOUNTER — Other Ambulatory Visit: Payer: Self-pay

## 2017-10-25 ENCOUNTER — Encounter (HOSPITAL_COMMUNITY)
Admission: RE | Admit: 2017-10-25 | Discharge: 2017-10-25 | Disposition: A | Discharge status: Home / Self Care | Payer: Medicare Other | Source: Ambulatory Visit | Attending: General Surgery | Admitting: General Surgery

## 2017-10-25 ENCOUNTER — Inpatient Hospital Stay (HOSPITAL_COMMUNITY): Admission: RE | Admit: 2017-10-25 | Payer: Medicare Other | Source: Ambulatory Visit

## 2017-10-25 ENCOUNTER — Encounter (HOSPITAL_COMMUNITY): Payer: Self-pay

## 2017-10-25 DIAGNOSIS — K668 Other specified disorders of peritoneum: Secondary | ICD-10-CM | POA: Diagnosis not present

## 2017-10-25 DIAGNOSIS — Z01812 Encounter for preprocedural laboratory examination: Secondary | ICD-10-CM | POA: Diagnosis not present

## 2017-10-25 HISTORY — DX: Malignant (primary) neoplasm, unspecified: C80.1

## 2017-10-25 LAB — CBC
HCT: 35.5 % — ABNORMAL LOW (ref 36.0–46.0)
HEMOGLOBIN: 11.5 g/dL — AB (ref 12.0–15.0)
MCH: 29.3 pg (ref 26.0–34.0)
MCHC: 32.4 g/dL (ref 30.0–36.0)
MCV: 90.6 fL (ref 78.0–100.0)
PLATELETS: 262 10*3/uL (ref 150–400)
RBC: 3.92 MIL/uL (ref 3.87–5.11)
RDW: 14.5 % (ref 11.5–15.5)
WBC: 7.2 10*3/uL (ref 4.0–10.5)

## 2017-10-25 LAB — BASIC METABOLIC PANEL
ANION GAP: 8 (ref 5–15)
BUN: 21 mg/dL — ABNORMAL HIGH (ref 6–20)
CALCIUM: 8.8 mg/dL — AB (ref 8.9–10.3)
CO2: 22 mmol/L (ref 22–32)
CREATININE: 1.21 mg/dL — AB (ref 0.44–1.00)
Chloride: 105 mmol/L (ref 101–111)
GFR calc Af Amer: 50 mL/min — ABNORMAL LOW (ref >60)
GFR, EST NON AFRICAN AMERICAN: 43 mL/min — AB (ref >60)
GLUCOSE: 99 mg/dL (ref 65–99)
Potassium: 5.3 mmol/L — ABNORMAL HIGH (ref 3.5–5.1)
Sodium: 135 mmol/L (ref 135–145)

## 2017-10-25 LAB — TYPE AND SCREEN
ABO/RH(D): O POS
ANTIBODY SCREEN: NEGATIVE

## 2017-10-25 NOTE — Pre-Procedure Instructions (Signed)
[name]  10/25/2017      [id number] - [name], [place] [address] [place] [address] Phone: [phone] Fax: [phone]    Your procedure is scheduled on *10-30-2017 Wednesday   Report to [facility] Admitting at 6:30 A.M.  Call this number if you have problems the morning of surgery:  [phone]   COMPLETE ONE DAY BOWEL PREP AS INSTRUCTED BY SURGEON OFFICE   Remember:  Do not eat food or drink liquids after midnight.   Take these medicines the morning of surgery with A SIP OF WATER AMITIZA,AZELASTINE NASAL SPRAY IF NEEDED,fLUOXETINE(PROZAC).HYDROCONE IF NEEDED FOR PAIN,LEVOTHYROXINE(SYNTHROID),CLARITIN IF NEEDED,PANTOPRAZOLE(PROTONIX)   STOP ASPIRIN,ANTIINFLAMATORIES (IBUPROFEN,ALEVE,MOTRIN,ADVIL,GOODY'S POWDERS),HERBAL SUPPLEMENTS,FISH OIL,AND VITAMINS 5-7 DAYS PRIOR TO SURGERY     Do not wear jewelry, make-up or nail polish.  Do not wear lotions, powders, or perfumes, or deoderant.  Do not shave 48 hours prior to surgery.  Men may shave face and neck.  Do not bring valuables to the hospital.  [facility] is not responsible for any belongings or valuables.  Contacts, dentures or bridgework may not be worn into surgery.  Leave your suitcase in the car.  After surgery it may be brought to your room.  For patients admitted to the hospital, discharge time will be determined by your treatment team.  Patients discharged the day of surgery will not be allowed to drive home.   Special Instructions: [facility] - Preparing for Surgery  Before surgery, you can play an important role.  Because skin is not sterile, your skin needs to be as free of germs as possible.  You can reduce the number of germs on you skin by washing with CHG (chlorahexidine gluconate) soap before surgery.  CHG is an antiseptic cleaner which kills germs and bonds with the skin to continue killing germs even after  washing.  Please DO NOT use if you have an allergy to CHG or antibacterial soaps.  If your skin becomes reddened/irritated stop using the CHG and inform your nurse when you arrive at Short Stay.  Do not shave (including legs and underarms) for at least 48 hours prior to the first CHG shower.  You may shave your face.  Please follow these instructions carefully:   1.  Shower with CHG Soap the night before surgery and the   morning of Surgery.  2.  If you choose to wash your hair, wash your hair first as usual with your normal shampoo.  3.  After you shampoo, rinse your hair and body thoroughly to remove the  Shampoo.  4.  Use CHG as you would any other liquid soap.  You can apply chg directly  to the skin and wash gently with scrungie or a clean washcloth.  5.  Apply the CHG Soap to your body ONLY FROM THE NECK DOWN.   Do not use on open wounds or open sores.  Avoid contact with your eyes,  ears, mouth and genitals (private parts).  Wash genitals (private parts) with your normal soap.  6.  Wash thoroughly, paying special attention to the area where your surgery will be performed.  7.  Thoroughly rinse your body with warm water from the neck down.  8.  DO NOT shower/wash with your normal soap after using and rinsing o  the CHG Soap.  9.  Pat yourself dry with a clean towel.  10.  Wear clean pajamas.            11.  Place clean sheets on your bed the night of your first shower and do not sleep with pets.  Day of Surgery  Do not apply any lotions/deodorants the morning of surgery.  Please wear clean clothes to the hospital/surgery center.    Please read over the following fact sheets that you were given. Coughing and Deep Breathing and Surgical Site Infection Prevention    DRINK BOTTLE OF ENSURE  PRE-SURGERY JUST BEFORE LEAVING FOR HOME

## 2017-10-25 NOTE — Progress Notes (Signed)
PCP Dr. Sinda Du  No history of cardiac problems Denies ever seeing a cardiologist Denies any cardiac testing

## 2017-10-30 ENCOUNTER — Encounter (HOSPITAL_COMMUNITY)
Admission: RE | Disposition: A | Discharge status: Home / Self Care | Payer: Self-pay | Source: Ambulatory Visit | Attending: General Surgery

## 2017-10-30 ENCOUNTER — Encounter (HOSPITAL_COMMUNITY): Payer: Self-pay | Admitting: *Deleted

## 2017-10-30 ENCOUNTER — Inpatient Hospital Stay (HOSPITAL_COMMUNITY): Payer: Medicare Other | Admitting: Anesthesiology

## 2017-10-30 ENCOUNTER — Other Ambulatory Visit: Payer: Self-pay

## 2017-10-30 ENCOUNTER — Inpatient Hospital Stay (HOSPITAL_COMMUNITY)
Admission: RE | Admit: 2017-10-30 | Discharge: 2017-11-02 | DRG: 343 | Disposition: A | Discharge status: Home / Self Care | Payer: Medicare Other | Source: Ambulatory Visit | Attending: Surgery | Admitting: Surgery

## 2017-10-30 DIAGNOSIS — Z86010 Personal history of colonic polyps: Secondary | ICD-10-CM | POA: Diagnosis not present

## 2017-10-30 DIAGNOSIS — M549 Dorsalgia, unspecified: Secondary | ICD-10-CM | POA: Diagnosis present

## 2017-10-30 DIAGNOSIS — K5669 Other partial intestinal obstruction: Principal | ICD-10-CM | POA: Diagnosis present

## 2017-10-30 DIAGNOSIS — Z9842 Cataract extraction status, left eye: Secondary | ICD-10-CM | POA: Diagnosis not present

## 2017-10-30 DIAGNOSIS — Z85820 Personal history of malignant melanoma of skin: Secondary | ICD-10-CM | POA: Diagnosis not present

## 2017-10-30 DIAGNOSIS — Z90711 Acquired absence of uterus with remaining cervical stump: Secondary | ICD-10-CM

## 2017-10-30 DIAGNOSIS — E039 Hypothyroidism, unspecified: Secondary | ICD-10-CM | POA: Diagnosis present

## 2017-10-30 DIAGNOSIS — G8929 Other chronic pain: Secondary | ICD-10-CM | POA: Diagnosis present

## 2017-10-30 DIAGNOSIS — R05 Cough: Secondary | ICD-10-CM | POA: Diagnosis not present

## 2017-10-30 DIAGNOSIS — Z820 Family history of epilepsy and other diseases of the nervous system: Secondary | ICD-10-CM | POA: Diagnosis not present

## 2017-10-30 DIAGNOSIS — R1909 Other intra-abdominal and pelvic swelling, mass and lump: Secondary | ICD-10-CM | POA: Diagnosis not present

## 2017-10-30 DIAGNOSIS — Z79899 Other long term (current) drug therapy: Secondary | ICD-10-CM

## 2017-10-30 DIAGNOSIS — Z79891 Long term (current) use of opiate analgesic: Secondary | ICD-10-CM

## 2017-10-30 DIAGNOSIS — Z79890 Hormone replacement therapy (postmenopausal): Secondary | ICD-10-CM

## 2017-10-30 DIAGNOSIS — K668 Other specified disorders of peritoneum: Secondary | ICD-10-CM | POA: Diagnosis present

## 2017-10-30 DIAGNOSIS — F418 Other specified anxiety disorders: Secondary | ICD-10-CM | POA: Diagnosis present

## 2017-10-30 DIAGNOSIS — Z9841 Cataract extraction status, right eye: Secondary | ICD-10-CM | POA: Diagnosis not present

## 2017-10-30 DIAGNOSIS — K388 Other specified diseases of appendix: Secondary | ICD-10-CM | POA: Diagnosis not present

## 2017-10-30 DIAGNOSIS — K654 Sclerosing mesenteritis: Secondary | ICD-10-CM | POA: Diagnosis not present

## 2017-10-30 DIAGNOSIS — K5000 Crohn's disease of small intestine without complications: Secondary | ICD-10-CM | POA: Diagnosis not present

## 2017-10-30 DIAGNOSIS — Z808 Family history of malignant neoplasm of other organs or systems: Secondary | ICD-10-CM | POA: Diagnosis not present

## 2017-10-30 DIAGNOSIS — M1990 Unspecified osteoarthritis, unspecified site: Secondary | ICD-10-CM | POA: Diagnosis present

## 2017-10-30 DIAGNOSIS — K219 Gastro-esophageal reflux disease without esophagitis: Secondary | ICD-10-CM | POA: Diagnosis present

## 2017-10-30 DIAGNOSIS — M797 Fibromyalgia: Secondary | ICD-10-CM | POA: Diagnosis present

## 2017-10-30 DIAGNOSIS — Z8371 Family history of colonic polyps: Secondary | ICD-10-CM

## 2017-10-30 HISTORY — PX: LAPAROSCOPIC APPENDECTOMY: SHX408

## 2017-10-30 HISTORY — PX: LAPAROSCOPIC REMOVAL OF MESENTERIC MASS: SHX5917

## 2017-10-30 HISTORY — PX: LAPAROSCOPIC SMALL BOWEL RESECTION: SHX5929

## 2017-10-30 SURGERY — EXCISION, MASS, MESENTERIC, LAPAROSCOPIC
Anesthesia: General | Site: Abdomen

## 2017-10-30 MED ORDER — CELECOXIB 200 MG PO CAPS
200.0000 mg | ORAL_CAPSULE | ORAL | Status: AC
  Administered 2017-10-30: 200 mg via ORAL
  Filled 2017-10-30: qty 1

## 2017-10-30 MED ORDER — LACTATED RINGERS IV SOLN
INTRAVENOUS | Status: DC | PRN
  Administered 2017-10-30 (×2): via INTRAVENOUS

## 2017-10-30 MED ORDER — KETOROLAC TROMETHAMINE 15 MG/ML IJ SOLN
15.0000 mg | Freq: Once | INTRAMUSCULAR | Status: AC
  Administered 2017-10-30: 15 mg via INTRAVENOUS

## 2017-10-30 MED ORDER — MORPHINE SULFATE (PF) 4 MG/ML IV SOLN
1.0000 mg | INTRAVENOUS | Status: DC | PRN
  Administered 2017-10-30: 4 mg via INTRAVENOUS
  Administered 2017-10-30: 2 mg via INTRAVENOUS
  Administered 2017-10-30 – 2017-10-31 (×4): 4 mg via INTRAVENOUS
  Filled 2017-10-30 (×6): qty 1

## 2017-10-30 MED ORDER — ACETAMINOPHEN 500 MG PO TABS
1000.0000 mg | ORAL_TABLET | ORAL | Status: AC
  Administered 2017-10-30: 1000 mg via ORAL
  Filled 2017-10-30: qty 2

## 2017-10-30 MED ORDER — FLUTICASONE PROPIONATE 50 MCG/ACT NA SUSP
2.0000 | Freq: Every day | NASAL | Status: DC | PRN
  Filled 2017-10-30: qty 16

## 2017-10-30 MED ORDER — AZELASTINE HCL 0.1 % NA SOLN
2.0000 | Freq: Every day | NASAL | Status: DC | PRN
  Filled 2017-10-30: qty 30

## 2017-10-30 MED ORDER — CHLORHEXIDINE GLUCONATE CLOTH 2 % EX PADS: 6.0000 | MEDICATED_PAD | Freq: Once | CUTANEOUS | Status: DC

## 2017-10-30 MED ORDER — SIMETHICONE 80 MG PO CHEW
80.0000 mg | CHEWABLE_TABLET | Freq: Four times a day (QID) | ORAL | Status: DC | PRN
  Administered 2017-10-30: 80 mg via ORAL
  Filled 2017-10-30: qty 1

## 2017-10-30 MED ORDER — PHENYLEPHRINE HCL 10 MG/ML IJ SOLN
INTRAMUSCULAR | Status: DC | PRN
  Administered 2017-10-30 (×5): 80 ug via INTRAVENOUS

## 2017-10-30 MED ORDER — ONDANSETRON HCL 4 MG/2ML IJ SOLN
INTRAMUSCULAR | Status: DC | PRN
  Administered 2017-10-30: 4 mg via INTRAVENOUS

## 2017-10-30 MED ORDER — MIDAZOLAM HCL 5 MG/5ML IJ SOLN
INTRAMUSCULAR | Status: DC | PRN
Start: 2017-10-30 — End: 2017-10-30
  Administered 2017-10-30: 2 mg via INTRAVENOUS

## 2017-10-30 MED ORDER — KCL IN DEXTROSE-NACL 20-5-0.9 MEQ/L-%-% IV SOLN
INTRAVENOUS | Status: DC
  Administered 2017-10-30 – 2017-11-01 (×4): via INTRAVENOUS
  Filled 2017-10-30 (×6): qty 1000

## 2017-10-30 MED ORDER — KETOROLAC TROMETHAMINE 15 MG/ML IJ SOLN
INTRAMUSCULAR | Status: AC
  Administered 2017-10-30: 15 mg via INTRAVENOUS
  Filled 2017-10-30: qty 1

## 2017-10-30 MED ORDER — AZELASTINE-FLUTICASONE 137-50 MCG/ACT NA SUSP: 2.0000 | Freq: Every day | NASAL | Status: DC | PRN

## 2017-10-30 MED ORDER — ONDANSETRON 4 MG PO TBDP: 4.0000 mg | ORAL_TABLET | Freq: Four times a day (QID) | ORAL | Status: DC | PRN

## 2017-10-30 MED ORDER — PROPOFOL 10 MG/ML IV BOLUS
INTRAVENOUS | Status: DC | PRN
  Administered 2017-10-30: 100 mg via INTRAVENOUS

## 2017-10-30 MED ORDER — CEFOTETAN DISODIUM-DEXTROSE 2-2.08 GM-%(50ML) IV SOLR
2.0000 g | INTRAVENOUS | Status: AC
  Administered 2017-10-30: 2 g via INTRAVENOUS
  Filled 2017-10-30: qty 50

## 2017-10-30 MED ORDER — HYDROMORPHONE HCL 1 MG/ML IJ SOLN
INTRAMUSCULAR | Status: AC
  Administered 2017-10-30: 0.5 mg via INTRAVENOUS
  Filled 2017-10-30: qty 1

## 2017-10-30 MED ORDER — MORPHINE SULFATE (PF) 2 MG/ML IV SOLN: 1.0000 mg | INTRAVENOUS | Status: DC | PRN

## 2017-10-30 MED ORDER — HYDROMORPHONE HCL 1 MG/ML IJ SOLN
0.5000 mg | INTRAMUSCULAR | Status: AC | PRN
  Administered 2017-10-30 (×4): 0.5 mg via INTRAVENOUS

## 2017-10-30 MED ORDER — GABAPENTIN 300 MG PO CAPS
300.0000 mg | ORAL_CAPSULE | ORAL | Status: AC
  Administered 2017-10-30: 300 mg via ORAL
  Filled 2017-10-30: qty 1

## 2017-10-30 MED ORDER — DEXAMETHASONE SODIUM PHOSPHATE 10 MG/ML IJ SOLN
INTRAMUSCULAR | Status: DC | PRN
  Administered 2017-10-30: 10 mg via INTRAVENOUS

## 2017-10-30 MED ORDER — LIDOCAINE HCL (CARDIAC) 20 MG/ML IV SOLN
INTRAVENOUS | Status: DC | PRN
  Administered 2017-10-30: 100 mg via INTRAVENOUS

## 2017-10-30 MED ORDER — DIPHENHYDRAMINE HCL 50 MG/ML IJ SOLN
INTRAMUSCULAR | Status: DC | PRN
  Administered 2017-10-30: 6.25 mg via INTRAVENOUS

## 2017-10-30 MED ORDER — EPHEDRINE SULFATE 50 MG/ML IJ SOLN
INTRAMUSCULAR | Status: DC | PRN
  Administered 2017-10-30 (×3): 10 mg via INTRAVENOUS

## 2017-10-30 MED ORDER — FENTANYL CITRATE (PF) 250 MCG/5ML IJ SOLN
INTRAMUSCULAR | Status: AC
  Filled 2017-10-30: qty 5

## 2017-10-30 MED ORDER — BUPIVACAINE-EPINEPHRINE 0.25% -1:200000 IJ SOLN
INTRAMUSCULAR | Status: DC | PRN
  Administered 2017-10-30: 12 mL

## 2017-10-30 MED ORDER — HEPARIN SODIUM (PORCINE) 5000 UNIT/ML IJ SOLN
5000.0000 [IU] | Freq: Three times a day (TID) | INTRAMUSCULAR | Status: DC
  Administered 2017-10-31 – 2017-11-01 (×6): 5000 [IU] via SUBCUTANEOUS
  Filled 2017-10-30 (×6): qty 1

## 2017-10-30 MED ORDER — LIDOCAINE 2% (20 MG/ML) 5 ML SYRINGE
INTRAMUSCULAR | Status: AC
  Filled 2017-10-30: qty 5

## 2017-10-30 MED ORDER — 0.9 % SODIUM CHLORIDE (POUR BTL) OPTIME
TOPICAL | Status: DC | PRN
  Administered 2017-10-30 (×3): 1000 mL

## 2017-10-30 MED ORDER — FENTANYL CITRATE (PF) 100 MCG/2ML IJ SOLN
25.0000 ug | INTRAMUSCULAR | Status: DC | PRN
  Administered 2017-10-30 (×3): 50 ug via INTRAVENOUS

## 2017-10-30 MED ORDER — FENTANYL CITRATE (PF) 100 MCG/2ML IJ SOLN
INTRAMUSCULAR | Status: AC
  Administered 2017-10-30: 50 ug via INTRAVENOUS
  Filled 2017-10-30: qty 2

## 2017-10-30 MED ORDER — ROCURONIUM BROMIDE 10 MG/ML (PF) SYRINGE
PREFILLED_SYRINGE | INTRAVENOUS | Status: AC
  Filled 2017-10-30: qty 5

## 2017-10-30 MED ORDER — POVIDONE-IODINE 10 % EX OINT
TOPICAL_OINTMENT | CUTANEOUS | Status: AC
  Filled 2017-10-30: qty 28.35

## 2017-10-30 MED ORDER — METHOCARBAMOL 1000 MG/10ML IJ SOLN
500.0000 mg | Freq: Four times a day (QID) | INTRAVENOUS | Status: DC | PRN
  Administered 2017-10-30 – 2017-11-02 (×7): 500 mg via INTRAVENOUS
  Filled 2017-10-30 (×10): qty 5

## 2017-10-30 MED ORDER — ONDANSETRON HCL 4 MG/2ML IJ SOLN: 4.0000 mg | Freq: Four times a day (QID) | INTRAMUSCULAR | Status: DC | PRN

## 2017-10-30 MED ORDER — FENTANYL CITRATE (PF) 100 MCG/2ML IJ SOLN
INTRAMUSCULAR | Status: AC
  Filled 2017-10-30: qty 2

## 2017-10-30 MED ORDER — MIDAZOLAM HCL 2 MG/2ML IJ SOLN
INTRAMUSCULAR | Status: AC
  Filled 2017-10-30: qty 2

## 2017-10-30 MED ORDER — PANTOPRAZOLE SODIUM 40 MG IV SOLR
40.0000 mg | Freq: Every day | INTRAVENOUS | Status: DC
  Administered 2017-10-30 – 2017-11-01 (×3): 40 mg via INTRAVENOUS
  Filled 2017-10-30 (×3): qty 40

## 2017-10-30 MED ORDER — ONDANSETRON HCL 4 MG/2ML IJ SOLN: 4.0000 mg | Freq: Once | INTRAMUSCULAR | Status: DC | PRN

## 2017-10-30 MED ORDER — BUPIVACAINE-EPINEPHRINE (PF) 0.25% -1:200000 IJ SOLN
INTRAMUSCULAR | Status: AC
  Filled 2017-10-30: qty 30

## 2017-10-30 MED ORDER — SUGAMMADEX SODIUM 200 MG/2ML IV SOLN
INTRAVENOUS | Status: DC | PRN
  Administered 2017-10-30: 200 mg via INTRAVENOUS

## 2017-10-30 MED ORDER — FENTANYL CITRATE (PF) 100 MCG/2ML IJ SOLN
INTRAMUSCULAR | Status: DC | PRN
  Administered 2017-10-30 (×2): 100 ug via INTRAVENOUS
  Administered 2017-10-30: 50 ug via INTRAVENOUS

## 2017-10-30 MED ORDER — ROCURONIUM BROMIDE 100 MG/10ML IV SOLN
INTRAVENOUS | Status: DC | PRN
  Administered 2017-10-30: 50 mg via INTRAVENOUS

## 2017-10-30 MED ORDER — HYDROMORPHONE HCL 1 MG/ML IJ SOLN
0.5000 mg | INTRAMUSCULAR | Status: AC | PRN
  Administered 2017-10-30 (×2): 0.5 mg via INTRAVENOUS

## 2017-10-30 MED ORDER — PROPOFOL 10 MG/ML IV BOLUS
INTRAVENOUS | Status: AC
  Filled 2017-10-30: qty 20

## 2017-10-30 SURGICAL SUPPLY — 63 items
CANISTER SUCT 3000ML PPV (MISCELLANEOUS) ×3 IMPLANT
CELLS DAT CNTRL 66122 CELL SVR (MISCELLANEOUS) ×1 IMPLANT
CHLORAPREP W/TINT 26ML (MISCELLANEOUS) ×3 IMPLANT
COVER SURGICAL LIGHT HANDLE (MISCELLANEOUS) ×3 IMPLANT
DRAPE WARM FLUID 44X44 (DRAPE) ×3 IMPLANT
DRSG OPSITE POSTOP 4X8 (GAUZE/BANDAGES/DRESSINGS) ×3 IMPLANT
DRSG TEGADERM 2-3/8X2-3/4 SM (GAUZE/BANDAGES/DRESSINGS) ×6 IMPLANT
ELECT CAUTERY BLADE 6.4 (BLADE) ×3 IMPLANT
ELECT REM PT RETURN 9FT ADLT (ELECTROSURGICAL) ×3
ELECTRODE REM PT RTRN 9FT ADLT (ELECTROSURGICAL) ×1 IMPLANT
GAUZE SPONGE 2X2 8PLY STRL LF (GAUZE/BANDAGES/DRESSINGS) ×1 IMPLANT
GLOVE BIO SURGEON STRL SZ7.5 (GLOVE) ×6 IMPLANT
GLOVE BIO SURGEON STRL SZ8 (GLOVE) ×3 IMPLANT
GLOVE BIO SURGEON STRL SZ8.5 (GLOVE) ×6 IMPLANT
GLOVE BIOGEL M STRL SZ7.5 (GLOVE) ×3 IMPLANT
GLOVE BIOGEL PI IND STRL 6.5 (GLOVE) ×2 IMPLANT
GLOVE BIOGEL PI IND STRL 8 (GLOVE) ×3 IMPLANT
GLOVE BIOGEL PI IND STRL 9 (GLOVE) ×2 IMPLANT
GLOVE BIOGEL PI INDICATOR 6.5 (GLOVE) ×4
GLOVE BIOGEL PI INDICATOR 8 (GLOVE) ×6
GLOVE BIOGEL PI INDICATOR 9 (GLOVE) ×4
GLOVE SURG SS PI 6.0 STRL IVOR (GLOVE) ×3 IMPLANT
GLOVE SURG SS PI 6.5 STRL IVOR (GLOVE) ×6 IMPLANT
GOWN STRL REUS W/ TWL LRG LVL3 (GOWN DISPOSABLE) ×5 IMPLANT
GOWN STRL REUS W/ TWL XL LVL3 (GOWN DISPOSABLE) ×3 IMPLANT
GOWN STRL REUS W/TWL 2XL LVL3 (GOWN DISPOSABLE) ×6 IMPLANT
GOWN STRL REUS W/TWL LRG LVL3 (GOWN DISPOSABLE) ×10
GOWN STRL REUS W/TWL XL LVL3 (GOWN DISPOSABLE) ×6
HANDLE SUCTION POOLE (INSTRUMENTS) ×1 IMPLANT
KIT BASIN OR (CUSTOM PROCEDURE TRAY) ×3 IMPLANT
KIT ROOM TURNOVER OR (KITS) ×3 IMPLANT
LIGASURE IMPACT 36 18CM CVD LR (INSTRUMENTS) ×3 IMPLANT
NS IRRIG 1000ML POUR BTL (IV SOLUTION) ×9 IMPLANT
PAD ARMBOARD 7.5X6 YLW CONV (MISCELLANEOUS) ×6 IMPLANT
PENCIL BUTTON HOLSTER BLD 10FT (ELECTRODE) ×3 IMPLANT
RELOAD PROXIMATE 75MM BLUE (ENDOMECHANICALS) ×12 IMPLANT
RTRCTR WOUND ALEXIS 18CM MED (MISCELLANEOUS) ×3
SLEEVE ENDOPATH XCEL 5M (ENDOMECHANICALS) ×3 IMPLANT
SPECIMEN JAR LARGE (MISCELLANEOUS) ×3 IMPLANT
SPONGE GAUZE 2X2 STER 10/PKG (GAUZE/BANDAGES/DRESSINGS) ×2
SPONGE LAP 18X18 X RAY DECT (DISPOSABLE) ×3 IMPLANT
STAPLER GUN LINEAR PROX 60 (STAPLE) ×3 IMPLANT
STAPLER PROXIMATE 75MM BLUE (STAPLE) ×3 IMPLANT
STAPLER VISISTAT 35W (STAPLE) ×3 IMPLANT
SUCTION POOLE HANDLE (INSTRUMENTS) ×3
SUT PDS AB 1 CT  36 (SUTURE) ×4
SUT PDS AB 1 CT 36 (SUTURE) ×2 IMPLANT
SUT PDS AB 1 TP1 96 (SUTURE) ×9 IMPLANT
SUT SILK 2 0 (SUTURE) ×2
SUT SILK 2 0 SH CR/8 (SUTURE) ×9 IMPLANT
SUT SILK 2-0 18XBRD TIE 12 (SUTURE) ×1 IMPLANT
SUT SILK 3 0 (SUTURE) ×2
SUT SILK 3 0 SH CR/8 (SUTURE) ×9 IMPLANT
SUT SILK 3-0 18XBRD TIE 12 (SUTURE) ×1 IMPLANT
SYR BULB IRRIGATION 50ML (SYRINGE) ×3 IMPLANT
TRAY FOLEY W/METER SILVER 14FR (SET/KITS/TRAYS/PACK) ×3 IMPLANT
TRAY LAPAROSCOPIC MC (CUSTOM PROCEDURE TRAY) ×3 IMPLANT
TROCAR XCEL BLUNT TIP 100MML (ENDOMECHANICALS) ×3 IMPLANT
TROCAR XCEL NON-BLD 5MMX100MML (ENDOMECHANICALS) ×3 IMPLANT
TUBE CONNECTING 12'X1/4 (SUCTIONS) ×1
TUBE CONNECTING 12X1/4 (SUCTIONS) ×2 IMPLANT
TUBING INSUFFLATION (TUBING) ×3 IMPLANT
YANKAUER SUCT BULB TIP NO VENT (SUCTIONS) ×6 IMPLANT

## 2017-10-30 NOTE — Anesthesia Preprocedure Evaluation (Addendum)
Anesthesia Evaluation  Patient identified by MRN, date of birth, ID band Patient awake    Reviewed: Allergy & Precautions, NPO status , Patient's Chart, lab work & pertinent test results  History of Anesthesia Complications (+) PONV and history of anesthetic complications  Airway Mallampati: II  TM Distance: >3 FB     Dental  (+) Teeth Intact, Implants   Pulmonary neg pulmonary ROS,    breath sounds clear to auscultation       Cardiovascular Exercise Tolerance: Good negative cardio ROS   Rhythm:Regular Rate:Normal  ECG: SR, rate 61   Neuro/Psych PSYCHIATRIC DISORDERS Anxiety Depression    GI/Hepatic Neg liver ROS, GERD  Medicated and Controlled, MESENTERIC MASS   Endo/Other  Hypothyroidism   Renal/GU negative Renal ROS     Musculoskeletal  (+) Fibromyalgia -, narcotic dependentChronic back pain   Abdominal   Peds  Hematology  (+) anemia ,   Anesthesia Other Findings   Reproductive/Obstetrics                            Anesthesia Physical  Anesthesia Plan  ASA: III  Anesthesia Plan: General   Post-op Pain Management:    Induction: Intravenous  PONV Risk Score and Plan: 4 or greater and Ondansetron, Dexamethasone, Treatment may vary due to age or medical condition and Midazolam  Airway Management Planned: Oral ETT  Additional Equipment:   Intra-op Plan:   Post-operative Plan: Extubation in OR  Informed Consent: I have reviewed the patients History and Physical, chart, labs and discussed the procedure including the risks, benefits and alternatives for the proposed anesthesia with the patient or authorized representative who has indicated his/her understanding and acceptance.   Dental advisory given  Plan Discussed with: CRNA  Anesthesia Plan Comments:        Anesthesia Quick Evaluation

## 2017-10-30 NOTE — Anesthesia Procedure Notes (Signed)
Procedure Name: Intubation Date/Time: 10/30/2017 8:48 AM Performed by: Purvis Kilts, CRNA Pre-anesthesia Checklist: Patient identified, Emergency Drugs available, Suction available, Patient being monitored and Timeout performed Patient Re-evaluated:Patient Re-evaluated prior to induction Oxygen Delivery Method: Circle system utilized Preoxygenation: Pre-oxygenation with 100% oxygen Induction Type: IV induction Ventilation: Mask ventilation without difficulty Laryngoscope Size: Mac and 3 Grade View: Grade I Tube type: Oral Tube size: 7.0 mm Number of attempts: 1 Airway Equipment and Method: Stylet Placement Confirmation: ETT inserted through vocal cords under direct vision,  positive ETCO2 and breath sounds checked- equal and bilateral Secured at: 21 cm Tube secured with: Tape Dental Injury: Teeth and Oropharynx as per pre-operative assessment

## 2017-10-30 NOTE — Op Note (Addendum)
10/30/2017  10:54 AM  PATIENT:  [name]  76 y.o. female  PRE-OPERATIVE DIAGNOSIS:  MESENTERIC MASS  POST-OPERATIVE DIAGNOSIS:  MESENTERIC MASS  PROCEDURE:  Procedure(s): DIAGNOSTIC LAPAROSCOPY OPEN SMALL BOWEL RESECTION WITH MESENTERIC MASS APPENDECTOMY   SURGEON:  Surgeon(s) and Role:    * Jovita Kussmaul, MD - Primary    * White, Sharon Mt, MD - Assisting  PHYSICIAN ASSISTANT:   ASSISTANTS: Dr. Dema Severin   ANESTHESIA:   general  EBL:  Minimal   BLOOD ADMINISTERED:none  DRAINS: none   LOCAL MEDICATIONS USED:  MARCAINE     SPECIMEN:  Source of Specimen:  small bowel with mesenteric mass and appendix  DISPOSITION OF SPECIMEN:  PATHOLOGY  COUNTS:  YES  TOURNIQUET:  * No tourniquets in log *  DICTATION: .Dragon Dictation   After informed consent was obtained the patient was brought to the operating room and placed in the supine position on the operating table.  After adequate induction of general anesthesia the patient's abdomen was prepped with ChloraPrep, allowed to dry, and draped in usual sterile manner.  An appropriate timeout was performed.  A site was chosen in the left upper quadrant for placement of a 5 mm port.  This area was infiltrated with quarter percent Marcaine and a small stab incision was made with a 15 blade knife.  A 5 mm Optiview port was used to bluntly dissected the layers of the abdominal wall under direct vision until access was gained to the abdominal cavity.  The abdomen was then insufflated with carbon dioxide without difficulty.  The camera was placed back through the 5 mm port and the abdomen was generally inspected.  Another 5 mm port was placed under direct vision in the left lower quadrant.  A Glassman grasper was used to run the small bowel.  We were able to identify the area of the mesenteric tumor.  The mesentery seem to be foreshortened so it did not look like any further laparoscopic dissection was going to be helpful.  At this point  a lower midline incision was made just below the umbilicus with a 10 blade knife.  The incision was carried through the skin and subcutaneous tissue sharply with electrocautery until the linea alba was identified.  The linea alba was incised with the electrocautery.  The preperitoneal space was probed bluntly with a hemostat until the peritoneum was opened and access was gained to the abdominal cavity.  The rest of the incision was opened under direct vision.  A wound protector was applied.  The small bowel was run from the ligament of Treitz to the ileocecal valve.  In the mid to distal small bowel there was a mesenteric mass causing a desmoplastic reaction and also causing an element of partial bowel obstruction.  It did not appear to be involving the main branch of the superior mesenteric artery.  There appeared to be plenty of other blood supply to the rest of the small bowel.  There was at least 180 cm of healthy-appearing small bowel above the area of the tumor as well as some healthy-appearing small bowel below the tumor.  There was some very small studding of potential disease in the rest of the mesentery of the small bowel.  In order to remove the mass as well as to relieve the obstruction we decided to resect the mesenteric mass.  We chose a site above and below the area of the tumor where the small bowel appeared healthy with good blood  supply.  The mesentery at each of the sites was opened sharply with the electrocautery.  The small bowel was divided at each site with a single firing of a GIA-75 stapler.  The mesentery involving the tumor was taken down sharply with the LigaSure.  The main vessels through the mesentery were clamped with Kelly clamps, divided, and ligated with 2-0 silk suture ligatures.  Once this was accomplished the segment of small bowel and mesentery with tumor was removed from the patient.  It was sent to pathology for further evaluation.  The proximal and distal segments of small  bowel appeared healthy with good blood supply.  A small opening was made on the posterior surface of each limb of small bowel.  Each limb of a GIA-75 stapler was then placed down the appropriate limb of small bowel, clamped, and fired thereby creating a nice widely patent enteroenterostomy.  The common opening was closed with a TA 60 stapler.  The staple line was reinforced with multiple 3-0 silk Lembert stitches as well as a 3-0 silk crotch stitch.  The mesentery was closed with interrupted 2-0 silk stitches.  Once this was accomplished the anastomosis appeared healthy with no tension and widely patent.  The surface of the liver was smooth.  There was no peritoneal or omental studding.  The appendix was readily identified as well and the patient had a special request to have the appendix removed at the same time.  The mesentery to the appendix was taken down sharply with the LigaSure.  The appendix was excised using a single firing of the GIA-75 stapler at the base of the appendix.  The staple line was then imbricated with a 2-0 silk Z stitch.  The abdomen was then irrigated with copious amounts of saline.  The NG tube was confirmed in the stomach and in good position.  No other abnormalities were noted except for some studding in the mesentery of the rest of the small bowel.  The fascia of the anterior abdominal wall was then closed with 2 running #1 double-stranded looped PDS sutures.  The subcutaneous tissue was irrigated with saline.  The skin was closed with staples and sterile dressings were applied.  The patient tolerated the procedure well.  At the end of the case all needle sponge and instrument counts were correct.  The patient was then awakened and taken to recovery in stable condition. The assistant was instrumental for retraction and visualization during the case.   PLAN OF CARE: Admit to inpatient   PATIENT DISPOSITION:  PACU - hemodynamically stable.   Delay start of Pharmacological VTE agent  (>24hrs) due to surgical blood loss or risk of bleeding: no

## 2017-10-30 NOTE — Transfer of Care (Signed)
Immediate Anesthesia Transfer of Care Note  Patient: [name]  Procedure(s) Performed: LAPAROSCOPIC RESECTION OF MASS SMALL BOWEL MESENTRY (N/A Abdomen) LAPAROSCOPIC ASSISTED SMALL BOWEL RESECTION (N/A Abdomen) APPENDECTOMY LAPAROSCOPIC (N/A Abdomen)  Patient Location: PACU  Anesthesia Type:General  Level of Consciousness: awake  Airway & Oxygen Therapy: Patient Spontanous Breathing  Post-op Assessment: Report given to RN and Post -op Vital signs reviewed and stable  Post vital signs: Reviewed and stable  Last Vitals:  Vitals:   10/30/17 0632 10/30/17 1107  BP: 103/60 (!) 158/86  Pulse: 63 84  Resp: 20 (!) 23  Temp: 36.5 C (!) 36.4 C  SpO2: 100% 96%    Last Pain:  Vitals:   10/30/17 1107  TempSrc:   PainSc: 0-No pain         Complications: No apparent anesthesia complications

## 2017-10-30 NOTE — H&P (Signed)
[name]  Location: [facility] Patient #: [id number] DOB: [birth year] Married / Language: English / Race: White Female   History of Present Illness  The patient is a 76 year old female who presents for a follow-up for Abdominal pain. The patient is a 75 year old white female who has been experiencing stomach swelling and back pain for greater than a year. During that time she has lost about 15 pounds. She denies any nausea or vomiting. As part of her workup she underwent a CT scan which incidentally showed a 2.1 cm calcified mass in the small bowel mesentery. This was not amenable to biopsy because there was no window passed the small bowel to get to it. Because of this we do not have a definitive diagnosis but we do know at this point that it needs to be removed. She denies any flushing or diarrhea.   Problem List/Past Medical  CALCIFIED MESENTERIC MASS (K66.8)   Past Surgical History  Breast Biopsy  Left. Cataract Surgery  Bilateral. Colon Polyp Removal - Colonoscopy  Colon Polyp Removal - Open  Gallbladder Surgery - Open  Hysterectomy (not due to cancer) - Partial  Spinal Surgery - Lower Back  Tonsillectomy   Diagnostic Studies History  Colonoscopy  1-5 years ago Mammogram  1-3 years ago Pap Smear  1-5 years ago  Allergies  No Known Drug Allergies  Allergies Reconciled   Medication History ALPRAZolam (2MG  Tablet, Oral) Active. Estradiol (0.025MG /24HR Patch Weekly, Transdermal) Active. Levothyroxine Sodium (50MCG Tablet, Oral) Active. FLUoxetine HCl (20MG  Capsule, Oral) Active. Pantoprazole Sodium (40MG  Tablet DR, Oral) Active. TiZANidine HCl (4MG  Tablet, Oral) Active. Hydrocodone-Acetaminophen (5-325MG  Tablet, Oral) Active. Methocarbamol (500MG  Tablet, Oral) Active. Medications Reconciled  Social History Alcohol use  Occasional alcohol use. Caffeine use  Coffee. No drug use  Tobacco use  Never smoker.  Family  History  Cancer  Sister. Colon Polyps  Sister. Melanoma  Sister. Migraine Headache  Daughter. Seizure disorder  Son.  Pregnancy / Birth History  Age at menarche  49 years. Age of menopause  58-50 Gravida  3 Maternal age  34-25 Para  3  Other Problems  Arthritis  Back Pain  Gastroesophageal Reflux Disease  Melanoma     Review of Systems General Present- Appetite Loss and Fatigue. Not Present- Chills, Fever, Night Sweats, Weight Gain and Weight Loss. Skin Not Present- Change in Wart/Mole, Dryness, Hives, Jaundice, New Lesions, Non-Healing Wounds, Rash and Ulcer. HEENT Present- Seasonal Allergies, Sinus Pain and Wears glasses/contact lenses. Not Present- Earache, Hearing Loss, Hoarseness, Nose Bleed, Oral Ulcers, Ringing in the Ears, Sore Throat, Visual Disturbances and Yellow Eyes. Respiratory Not Present- Bloody sputum, Chronic Cough, Difficulty Breathing, Snoring and Wheezing. Breast Not Present- Breast Mass, Breast Pain, Nipple Discharge and Skin Changes. Cardiovascular Not Present- Chest Pain, Difficulty Breathing Lying Down, Leg Cramps, Palpitations, Rapid Heart Rate, Shortness of Breath and Swelling of Extremities. Gastrointestinal Present- Abdominal Pain, Bloating, Constipation, Excessive gas, Gets full quickly at meals and Indigestion. Not Present- Bloody Stool, Change in Bowel Habits, Chronic diarrhea, Difficulty Swallowing, Hemorrhoids, Nausea, Rectal Pain and Vomiting. Female Genitourinary Not Present- Frequency, Nocturia, Painful Urination, Pelvic Pain and Urgency. Musculoskeletal Present- Back Pain and Joint Pain. Not Present- Joint Stiffness, Muscle Pain, Muscle Weakness and Swelling of Extremities. Neurological Not Present- Decreased Memory, Fainting, Headaches, Numbness, Seizures, Tingling, Tremor, Trouble walking and Weakness. Psychiatric Not Present- Anxiety, Bipolar, Change in Sleep Pattern, Depression, Fearful and Frequent crying. Endocrine Present-  Hot flashes. Not Present- Cold Intolerance, Excessive Hunger, Hair Changes,  Heat Intolerance and New Diabetes. Hematology Not Present- Blood Thinners, Easy Bruising, Excessive bleeding, Gland problems, HIV and Persistent Infections.  Vitals  Weight: 124.13 lb Height: 64in Body Surface Area: 1.6 m Body Mass Index: 21.31 kg/m  BP: 120/84 (Sitting, Left Arm, Standard)       Physical Exam  General Mental Status-Alert. General Appearance-Consistent with stated age. Hydration-Well hydrated. Voice-Normal.  Head and Neck Head-normocephalic, atraumatic with no lesions or palpable masses. Trachea-midline. Thyroid Gland Characteristics - normal size and consistency.  Eye Eyeball - Bilateral-Extraocular movements intact. Sclera/Conjunctiva - Bilateral-No scleral icterus.  Chest and Lung Exam Chest and lung exam reveals -quiet, even and easy respiratory effort with no use of accessory muscles and on auscultation, normal breath sounds, no adventitious sounds and normal vocal resonance. Inspection Chest Wall - Normal. Back - normal.  Cardiovascular Cardiovascular examination reveals -normal heart sounds, regular rate and rhythm with no murmurs and normal pedal pulses bilaterally.  Abdomen Note: The abdomen is soft with mild central tenderness. There is no guarding or peritonitis. There is no palpable mass. As no palpable groin or supraclavicular lymphadenopathy.   Neurologic Neurologic evaluation reveals -alert and oriented x 3 with no impairment of recent or remote memory. Mental Status-Normal.  Musculoskeletal Normal Exam - Left-Upper Extremity Strength Normal and Lower Extremity Strength Normal. Normal Exam - Right-Upper Extremity Strength Normal and Lower Extremity Strength Normal.  Lymphatic Head & Neck  General Head & Neck Lymphatics: Bilateral - Description - Normal. Axillary  General Axillary Region: Bilateral - Description -  Normal. Tenderness - Non Tender. Femoral & Inguinal  Generalized Femoral & Inguinal Lymphatics: Bilateral - Description - Normal. Tenderness - Non Tender.    Assessment & Plan CALCIFIED MESENTERIC MASS (K66.8) Impression: The patient appears to have a 2.1 cm calcified mass in the mesentery of the small bowel. It certainly has an appearance that could be consistent with a carcinoid. The mass is not amenable to biopsy so at this point I would recommend that this area be resected. This will likely need to have a segment of small intestine removed with it at the same time. I have talked to her in detail about the different possibilities as well as the risks and benefits of surgery as well as some of the technical aspects including the risk of short gut syndrome and leak and she understands and wishes to proceed.

## 2017-10-30 NOTE — Anesthesia Postprocedure Evaluation (Signed)
Anesthesia Post Note  Patient: [name]  Procedure(s) Performed: LAPAROSCOPIC RESECTION OF MASS SMALL BOWEL MESENTRY (N/A Abdomen) LAPAROSCOPIC ASSISTED SMALL BOWEL RESECTION (N/A Abdomen) APPENDECTOMY LAPAROSCOPIC (N/A Abdomen)     Patient location during evaluation: PACU Anesthesia Type: General Level of consciousness: awake and alert Pain management: pain level controlled Vital Signs Assessment: post-procedure vital signs reviewed and stable Respiratory status: spontaneous breathing, nonlabored ventilation, respiratory function stable and patient connected to nasal cannula oxygen Cardiovascular status: blood pressure returned to baseline and stable Postop Assessment: no apparent nausea or vomiting Anesthetic complications: no    Last Vitals:  Vitals:   10/30/17 1400 10/30/17 1422  BP:  (!) 151/80  Pulse: 86 84  Resp: 19 20  Temp: 36.5 C (!) 36.4 C  SpO2: 100% 100%    Last Pain:  Vitals:   10/30/17 1502  TempSrc:   PainSc: 4                  Evren Shankland P Dontarius Sheley

## 2017-10-30 NOTE — Interval H&P Note (Signed)
History and Physical Interval Note:  10/30/2017 8:27 AM  [name]  has presented today for surgery, with the diagnosis of MESENTERIC MASS  The various methods of treatment have been discussed with the patient and family. After consideration of risks, benefits and other options for treatment, the patient has consented to  Procedure(Yang): LAPAROSCOPIC RESECTION OF MASS SMALL BOWEL MESENTRY WITH SMALL BOWEL RESECTION (N/A) LAPAROSCOPIC SMALL BOWEL RESECTION (N/A) as a surgical intervention .  The patient'Yang history has been reviewed, patient examined, no change in status, stable for surgery.  I have reviewed the patient'Yang chart and labs.  Questions were answered to the patient'Yang satisfaction.     TOTH III,[name]

## 2017-10-31 ENCOUNTER — Encounter (HOSPITAL_COMMUNITY): Payer: Self-pay | Admitting: General Surgery

## 2017-10-31 LAB — BASIC METABOLIC PANEL
ANION GAP: 8 (ref 5–15)
BUN: 11 mg/dL (ref 6–20)
CALCIUM: 8 mg/dL — AB (ref 8.9–10.3)
CO2: 25 mmol/L (ref 22–32)
Chloride: 105 mmol/L (ref 101–111)
Creatinine, Ser: 1.06 mg/dL — ABNORMAL HIGH (ref 0.44–1.00)
GFR, EST AFRICAN AMERICAN: 58 mL/min — AB (ref >60)
GFR, EST NON AFRICAN AMERICAN: 50 mL/min — AB (ref >60)
GLUCOSE: 129 mg/dL — AB (ref 65–99)
POTASSIUM: 4.6 mmol/L (ref 3.5–5.1)
Sodium: 138 mmol/L (ref 135–145)

## 2017-10-31 LAB — CBC
HEMATOCRIT: 31.4 % — AB (ref 36.0–46.0)
HEMOGLOBIN: 10.4 g/dL — AB (ref 12.0–15.0)
MCH: 29.7 pg (ref 26.0–34.0)
MCHC: 33.1 g/dL (ref 30.0–36.0)
MCV: 89.7 fL (ref 78.0–100.0)
Platelets: 225 10*3/uL (ref 150–400)
RBC: 3.5 MIL/uL — AB (ref 3.87–5.11)
RDW: 14.7 % (ref 11.5–15.5)
WBC: 13.8 10*3/uL — ABNORMAL HIGH (ref 4.0–10.5)

## 2017-10-31 MED ORDER — NALOXONE HCL 0.4 MG/ML IJ SOLN: 0.4000 mg | INTRAMUSCULAR | Status: DC | PRN

## 2017-10-31 MED ORDER — DIPHENHYDRAMINE HCL 50 MG/ML IJ SOLN
12.5000 mg | Freq: Four times a day (QID) | INTRAMUSCULAR | Status: DC | PRN
  Administered 2017-10-31 (×2): 12.5 mg via INTRAVENOUS
  Filled 2017-10-31 (×2): qty 1

## 2017-10-31 MED ORDER — DIPHENHYDRAMINE HCL 12.5 MG/5ML PO ELIX: 12.5000 mg | ORAL_SOLUTION | Freq: Four times a day (QID) | ORAL | Status: DC | PRN

## 2017-10-31 MED ORDER — SODIUM CHLORIDE 0.9% FLUSH: 9.0000 mL | INTRAVENOUS | Status: DC | PRN

## 2017-10-31 MED ORDER — ONDANSETRON HCL 4 MG/2ML IJ SOLN: 4.0000 mg | Freq: Four times a day (QID) | INTRAMUSCULAR | Status: DC | PRN

## 2017-10-31 MED ORDER — MORPHINE SULFATE 2 MG/ML IV SOLN
INTRAVENOUS | Status: DC
  Administered 2017-10-31: 10:00:00 via INTRAVENOUS
  Administered 2017-10-31 (×2): 16.5 mg via INTRAVENOUS
  Administered 2017-10-31: 1.5 mg via INTRAVENOUS
  Administered 2017-11-01: 7.5 mg via INTRAVENOUS
  Administered 2017-11-01: 9 mg via INTRAVENOUS
  Administered 2017-11-01: 6 mg via INTRAVENOUS
  Administered 2017-11-01: 15 mg via INTRAVENOUS
  Administered 2017-11-02: 0.8 mg via INTRAVENOUS
  Administered 2017-11-02: 0 mg via INTRAVENOUS
  Administered 2017-11-02: 4.4 mg via INTRAVENOUS
  Filled 2017-10-31: qty 30
  Filled 2017-10-31: qty 25
  Filled 2017-10-31: qty 30

## 2017-10-31 NOTE — Care Management Note (Signed)
Case Management Note  Patient Details  Name: [name] MRN: [id number] Date of Birth: October 15, 1941  Subjective/Objective:                    Action/Plan:  Bedside nurse entered case management consult.   Case management will follow patient. Patient POD 1, NGT and starting PCA for better pain management.    Expected Discharge Date:                  Expected Discharge Plan:     In-House Referral:     Discharge planning Services  CM Consult  Post Acute Care Choice:  Home Health Choice offered to:     DME Arranged:    DME Agency:     HH Arranged:    HH Agency:     Status of Service:  In process, will continue to follow  If discussed at Long Length of Stay Meetings, dates discussed:    Additional Comments:  Marilu Favre, RN 10/31/2017, 10:31 AM

## 2017-10-31 NOTE — Progress Notes (Signed)
1 Day Post-Op   Subjective/Chief Complaint: Complains of back pain   Objective: Vital signs in last 24 hours: Temp:  [97.5 F (36.4 C)-98.9 F (37.2 C)] 98.9 F (37.2 C) (12/06 0537) Pulse Rate:  [62-91] 62 (12/06 0537) Resp:  [12-34] 17 (12/06 0537) BP: (107-167)/(54-99) 107/54 (12/06 0537) SpO2:  [94 %-100 %] 100 % (12/06 0537) Weight:  [57.6 kg (126 lb 15.8 oz)] 57.6 kg (126 lb 15.8 oz) (12/05 1835) Last BM Date: 10/26/17  Intake/Output from previous day: 12/05 0701 - 12/06 0700 In: 1500 [I.V.:1500] Out: 1000 [Urine:950; Blood:50] Intake/Output this shift: No intake/output data recorded.  General appearance: alert and cooperative Resp: clear to auscultation bilaterally Cardio: regular rate and rhythm GI: soft, moderate tenderness. incision looks good  Lab Results:  Recent Labs    10/31/17 0551  WBC 13.8*  HGB 10.4*  HCT 31.4*  PLT 225   BMET Recent Labs    10/31/17 0551  NA 138  K 4.6  CL 105  CO2 25  GLUCOSE 129*  BUN 11  CREATININE 1.06*  CALCIUM 8.0*   PT/INR No results for input(s): LABPROT, INR in the last 72 hours. ABG No results for input(s): PHART, HCO3 in the last 72 hours.  Invalid input(s): PCO2, PO2  Studies/Results: No results found.  Anti-infectives: Anti-infectives (From admission, onward)   Start     Dose/Rate Route Frequency Ordered Stop   10/30/17 0629  cefoTEtan in Dextrose 5% (CEFOTAN) IVPB 2 g     2 g Intravenous On call to O.R. 10/30/17 7680 10/30/17 8811      Assessment/Plan: s/p Procedure(s): LAPAROSCOPIC RESECTION OF MASS SMALL BOWEL MESENTRY (N/A) LAPAROSCOPIC ASSISTED SMALL BOWEL RESECTION (N/A) APPENDECTOMY LAPAROSCOPIC (N/A) ng to suction  OOB Start pca for pain control  LOS: 1 day    TOTH III,Zury Fazzino S 10/31/2017

## 2017-10-31 NOTE — Progress Notes (Signed)
Initial Nutrition Assessment  DOCUMENTATION CODES:   Not applicable  INTERVENTION:   -RD will follow for diet advancement and supplement as appropriate  NUTRITION DIAGNOSIS:   Inadequate oral intake related to altered GI function as evidenced by NPO status.  GOAL:   Patient will meet greater than or equal to 90% of their needs  MONITOR:   Labs, Skin, I & O's, Weight trends, Diet advancement  REASON FOR ASSESSMENT:   Malnutrition Screening Tool    ASSESSMENT:   Pt is a 76 year old female who is admitted with mesenteric mass.  S/p Procedure(s): 12/5 DIAGNOSTIC LAPAROSCOPY OPEN SMALL BOWEL RESECTION WITH MESENTERIC MASS APPENDECTOMY   Hx obtained from pt and family at bedside. All confirm that pt has had a poor appetite over the past several years, however, noticed a dramatic decline in health over the past year. Per pt daughter at bedside, pt eats very little PTA (Breakfast: cereal or eggs, Lunch: yogurt, Dinner: meat, starch, and vegetable). Pt started consuming Ensure supplements BID 5 days before admission and tolerated them well.   Per pt, she has lost about 25# over the past year. Pt shares UBW is around 140#. However, this is not consistent with hx hx, which reveals wt stability over the past 11 months.   Pt with NGT connected to suction; noted 225 ml output per doc flowsheets.   Discussed with pt potential for diet progression. She is amenable to supplements once diet is advanced.   Labs reviewed.   NUTRITION - FOCUSED PHYSICAL EXAM:    Most Recent Value  Orbital Region  No depletion  Upper Arm Region  Mild depletion  Thoracic and Lumbar Region  No depletion  Buccal Region  No depletion  Temple Region  No depletion  Clavicle Bone Region  No depletion  Clavicle and Acromion Bone Region  No depletion  Scapular Bone Region  No depletion  Dorsal Hand  No depletion  Patellar Region  No depletion  Anterior Thigh Region  No depletion  Posterior Calf Region   Mild depletion  Edema (RD Assessment)  None  Hair  Reviewed  Eyes  Reviewed  Mouth  Reviewed  Skin  Reviewed  Nails  Reviewed       Diet Order:  Diet NPO time specified Except for: [employer], Sips with Meds  EDUCATION NEEDS:   Education needs have been addressed  Skin:  Skin Assessment: Skin Integrity Issues: Skin Integrity Issues:: Incisions Incisions: closed abdomen  Last BM:  10/31/17  Height:   Ht Readings from Last 1 Encounters:  10/30/17 5\' 4"  (1.626 m)    Weight:   Wt Readings from Last 1 Encounters:  10/30/17 126 lb 15.8 oz (57.6 kg)    Ideal Body Weight:  59.1 kg  BMI:  Body mass index is 21.8 kg/m.  Estimated Nutritional Needs:   Kcal:  1550-1750  Protein:  75-90 grams  Fluid:  1.5-1.7 L    Nhan Qualley A. Jimmye Norman, RD, LDN, CDE Pager: [phone] After hours Pager: [phone]

## 2017-11-01 NOTE — Progress Notes (Signed)
2 Days Post-Op   Subjective/Chief Complaint: Complains of back pain. States she has passed some flatus and stool   Objective: Vital signs in last 24 hours: Temp:  [98.1 F (36.7 C)-99.2 F (37.3 C)] 98.5 F (36.9 C) (12/07 0429) Pulse Rate:  [70-100] 85 (12/07 0429) Resp:  [18-21] 20 (12/07 0806) BP: (94-134)/(44-69) 119/63 (12/07 0429) SpO2:  [91 %-100 %] 97 % (12/07 0806) Last BM Date: 10/31/17  Intake/Output from previous day: 12/06 0701 - 12/07 0700 In: 2401.2 [I.V.:2296.2; IV Piggyback:105] Out: 450 [Urine:150; Emesis/NG output:300] Intake/Output this shift: No intake/output data recorded.  General appearance: alert and cooperative Resp: clear to auscultation bilaterally Cardio: regular rate and rhythm GI: soft, moderate tenderness. no guarding. few bs. incision looks good  Lab Results:  Recent Labs    10/31/17 0551  WBC 13.8*  HGB 10.4*  HCT 31.4*  PLT 225   BMET Recent Labs    10/31/17 0551  NA 138  K 4.6  CL 105  CO2 25  GLUCOSE 129*  BUN 11  CREATININE 1.06*  CALCIUM 8.0*   PT/INR No results for input(s): LABPROT, INR in the last 72 hours. ABG No results for input(s): PHART, HCO3 in the last 72 hours.  Invalid input(s): PCO2, PO2  Studies/Results: No results found.  Anti-infectives: Anti-infectives (From admission, onward)   Start     Dose/Rate Route Frequency Ordered Stop   10/30/17 0629  cefoTEtan in Dextrose 5% (CEFOTAN) IVPB 2 g     2 g Intravenous On call to O.R. 10/30/17 0109 10/30/17 3235      Assessment/Plan: s/p Procedure(s): LAPAROSCOPIC RESECTION OF MASS SMALL BOWEL MESENTRY (N/A) LAPAROSCOPIC ASSISTED SMALL BOWEL RESECTION (N/A) APPENDECTOMY LAPAROSCOPIC (N/A) Advance diet. Allow sips of clears D/c ng Ambulate Continue pca for pain control Path shows no tumor  LOS: 2 days    TOTH III,Jacquilyn Seldon S 11/01/2017

## 2017-11-01 NOTE — Consult Note (Signed)
[facility] Primary Care Navigator  11/01/2017  [name] [birth year] [id number]   Met withpatient, daughter [name]) and sister [name])at the bedside to identify possible discharge needs.  Patient reports having "persistent abdominal gas discomfort that causes pressure to back" that had resulted tothis admission/ surgery.   Patientendorses Dr. Sinda Du with [facility]. Luan Pulling, MD office as her primary care provider.   [facility] [place] to obtain medications without any difficulty.   Patient verbalizedthatsheismanaging her own medications at [facility] use of "pill box" system filledonce a week.  Patientreports that she was driving prior to admission/ surgery. However, patient's husband [name]) or sister Brittany Penny) will be able toprovidetransportation to her doctors'appointments after discharge.   Patient states that her husband will be the primary caregiver at home. Her sister and daughters [name] and [name], sports) will also be able to assist with her care needs if needed.  Anticipated discharge plan is home per patient and daughter.  Patientvoiced understanding to call primary care provider's office when she gets back home, for a post hospital follow-up appointment within 1-2 weeks or sooner if needs arise. Patient letter (with PCP'scontact number) was provided as their reminder.   Explained to patientand family about [facility] CM services available for health management at home butpatient denies any needs or concerns at this time.  Patientexpressed understanding to seek referral to [place] from primary care providerif deemed necessary and appropriate for services in the future.   [facility] care management information provided for future needs thatshemay have.   Patienthowever, hadoptedand verbally agreedfor EMMI calls to follow-up withherrecovery at home.   Referral made  for [facility] calls after discharge.   For additional questions please contact:  Edwena Felty A. Kalev Temme, BSN, RN-BC [facility] PRIMARY CARE Navigator Cell: [phone]

## 2017-11-02 MED ORDER — OXYCODONE HCL 5 MG PO TABS: 5.0000 mg | ORAL_TABLET | Freq: Four times a day (QID) | ORAL | 0 refills | Status: DC | PRN

## 2017-11-02 NOTE — Progress Notes (Signed)
Patient ID: [name], female   DOB: 12/14/1940, 76 y.o.   MRN: [id number]   Doing well Tolerating po and having BM's Wants to go home before the big snow storm Abdomen soft, non-distended  Plan: Discharge home

## 2017-11-02 NOTE — Progress Notes (Signed)
[name] discharge  per MD order. Discussed with the patient and all questions fully answered.  VSS, Skin clean, dry and intact without evidence of skin break down, no evidence of skin tears noted.  IV catheter discontinued intact. Site without signs and symptoms of complications. Dressing and pressure applied.  An After Visit Summary was printed and given to the patient. Patient received prescription.  Discharge education completed with patient/family including follow up instructions, medication list, d/c activities limitations if indicated, with other d/c instructions as indicated by MD - patient able to verbalize understanding, all questions fully answered.   Patient instructed to return to ED, call 911, or call MD for any changes in condition.   Patient to be escorted via [place], and D/C home via private auto.

## 2017-11-02 NOTE — Discharge Summary (Signed)
Physician Discharge Summary  Patient ID: [name] MRN: [id number] DOB/AGE: 74-Jul-1942 74 y.o.  Admit date: 10/30/2017 Discharge date: 11/02/2017  Admission Diagnoses:  Discharge Diagnoses:  Active Problems:   Calcified mesenteric mass   Discharged Condition: good  Hospital Course: uneventful post op recovery.  Quick return of bowel function.  Discharged home POD# 3 and patient and family request doing well  Consults: None  Significant Diagnostic Studies:   Treatments: surgery: laparoscopic assisted small bowel resection, appendectomy  Discharge Exam: Blood pressure 114/65, pulse 70, temperature 98.2 F (36.8 C), temperature source Oral, resp. rate 18, height 5\' 4"  (1.626 m), weight 57.6 kg (126 lb 15.8 oz), SpO2 99 %. General appearance: alert, cooperative and no distress Resp: clear to auscultation bilaterally Cardio: regular rate and rhythm, S1, S2 normal, no murmur, click, rub or gallop Incision/Wound:abdomen soft, non-distended, dressing dry  Disposition: 01-Home or Self Care   Allergies as of 11/02/2017   No Known Allergies     Medication List    TAKE these medications   ALPRAZolam 1 MG tablet Commonly known as:  XANAX Take 1 mg at bedtime by mouth.   AMITIZA 24 MCG capsule Generic drug:  lubiprostone TAKE 1 CAPSULE (24 MCG TOTAL) BY MOUTH DAILY WITH BREAKFAST.   aspirin EC 81 MG tablet Take 81 mg daily by mouth.   celecoxib 200 MG capsule Commonly known as:  CELEBREX Take 200 mg by mouth daily.   DYMISTA 137-50 MCG/ACT Susp Generic drug:  Azelastine-Fluticasone Place 2 sprays into both nostrils daily as needed (for allergies).   estradiol 0.025 mg/24hr patch Commonly known as:  CLIMARA - Dosed in mg/24 hr Place 0.025 mg onto the skin every Monday.   FLUoxetine 20 MG tablet Commonly known as:  PROZAC Take 20 mg by mouth daily.   HYDROcodone-acetaminophen 5-325 MG tablet Commonly known as:  NORCO/VICODIN Take 1 tablet 3 (three) times  daily as needed by mouth for moderate pain.   levothyroxine 50 MCG tablet Commonly known as:  SYNTHROID, LEVOTHROID Take 50 mcg by mouth daily before breakfast.   loratadine-pseudoephedrine 10-240 MG 24 hr tablet Commonly known as:  CLARITIN-D 24-hour Take 1 tablet daily as needed by mouth for allergies.   OVER THE COUNTER MEDICATION Take 2 capsules daily by mouth. Premiere Formula Ocular Nutrition   OVER THE COUNTER MEDICATION Take 1 capsule daily by mouth. Probiotic Chime Supplement   oxyCODONE 5 MG immediate release tablet Commonly known as:  Oxy IR/ROXICODONE Take 1-2 tablets (5-10 mg total) by mouth every 6 (six) hours as needed for moderate pain, severe pain or breakthrough pain.   pantoprazole 40 MG tablet Commonly known as:  PROTONIX Take 40 mg by mouth daily.   tobramycin 0.3 % ophthalmic solution Commonly known as:  TOBREX Place 1 drop into the right eye See admin instructions. Place 1 drop in right eye 4 times daily the day before, the day of and the day after eye injections      Follow-up Information    Autumn Messing III, MD. Schedule an appointment as soon as possible for a visit in 2 week(s).   Specialty:  General Surgery Contact information: [address] [place] [address] [phone]           Signed: Harl Bowie 11/02/2017, 10:41 AM

## 2017-11-02 NOTE — Discharge Instructions (Signed)
[facility]      [facility] [phone]  OPEN ABDOMINAL SURGERY: POST OP INSTRUCTIONS  Always review your discharge instruction sheet given to you by the facility where your surgery was performed.  IF YOU HAVE DISABILITY OR FAMILY LEAVE FORMS, YOU MUST BRING THEM TO THE OFFICE FOR PROCESSING.  PLEASE DO NOT GIVE THEM TO YOUR DOCTOR.  1. A prescription for pain medication may be given to you upon discharge.  Take your pain medication as prescribed, if needed.  If narcotic pain medicine is not needed, then you may take acetaminophen (Tylenol) or ibuprofen (Advil) as needed. 2. Take your usually prescribed medications unless otherwise directed. 3. If you need a refill on your pain medication, please contact your pharmacy. They will contact our office to request authorization.  Prescriptions will not be filled after 5pm or on week-ends. 4. You should follow a light diet the first few days after arrival home, such as soup and crackers, pudding, etc.unless your doctor has advised otherwise. A high-fiber, low fat diet can be resumed as tolerated.   Be sure to include lots of fluids daily. Most patients will experience some swelling and bruising on the chest and neck area.  Ice packs will help.  Swelling and bruising can take several days to resolve 5. Most patients will experience some swelling and bruising in the area of the incision. Ice pack will help. Swelling and bruising can take several days to resolve..  6. It is common to experience some constipation if taking pain medication after surgery.  Increasing fluid intake and taking a stool softener will usually help or prevent this problem from occurring.  A mild laxative (Milk of Magnesia or Miralax) should be taken according to package directions if there are no bowel movements after 48 hours. 7.  You may have steri-strips (small skin tapes) in place directly over the incision.  These strips should be left on the skin for 7-10 days.  If your  surgeon used skin glue on the incision, you may shower in 24 hours.  The glue will flake off over the next 2-3 weeks.  Any sutures or staples will be removed at the office during your follow-up visit. You may find that a light gauze bandage over your incision may keep your staples from being rubbed or pulled. You may shower and replace the bandage daily. 8. ACTIVITIES:  You may resume regular (light) daily activities beginning the next day--such as daily self-care, walking, climbing stairs--gradually increasing activities as tolerated.  You may have sexual intercourse when it is comfortable.  Refrain from any heavy lifting or straining until approved by your doctor. a. You may drive when you no longer are taking prescription pain medication, you can comfortably wear a seatbelt, and you can safely maneuver your car and apply brakes b. Return to Work: ___________________________________ 9. You should see your doctor in the office for a follow-up appointment approximately two weeks after your surgery.  Make sure that you call for this appointment within a day or two after you arrive home to insure a convenient appointment time. OTHER INSTRUCTIONS:  _____________________________________________________________ _____________________________________________________________  WHEN TO CALL YOUR DOCTOR: 1. Fever over 101.0 2. Inability to urinate 3. Nausea and/or vomiting 4. Extreme swelling or bruising 5. Continued bleeding from incision. 6. Increased pain, redness, or drainage from the incision. 7. Difficulty swallowing or breathing 8. Muscle cramping or spasms. 9. Numbness or tingling in hands or feet or around lips.  The clinic staff is available to   answer your questions during regular business hours.  Please don't hesitate to call and ask to speak to one of the nurses if you have concerns.  For further questions, please visit [email or web address]   

## 2017-11-02 NOTE — Progress Notes (Signed)
Removed remaining morphine in syringe from PCA pump.  Discarding 50mL in sink.  Witnessed by Cameron Sprang, RN.

## 2017-11-05 ENCOUNTER — Ambulatory Visit (HOSPITAL_COMMUNITY): Payer: Medicare Other

## 2017-11-15 DIAGNOSIS — H353211 Exudative age-related macular degeneration, right eye, with active choroidal neovascularization: Secondary | ICD-10-CM | POA: Diagnosis not present

## 2017-11-15 DIAGNOSIS — H353122 Nonexudative age-related macular degeneration, left eye, intermediate dry stage: Secondary | ICD-10-CM | POA: Diagnosis not present

## 2017-12-11 DIAGNOSIS — R51 Headache: Secondary | ICD-10-CM | POA: Diagnosis not present

## 2017-12-11 DIAGNOSIS — J343 Hypertrophy of nasal turbinates: Secondary | ICD-10-CM | POA: Diagnosis not present

## 2017-12-11 DIAGNOSIS — J342 Deviated nasal septum: Secondary | ICD-10-CM | POA: Diagnosis not present

## 2017-12-11 DIAGNOSIS — J324 Chronic pansinusitis: Secondary | ICD-10-CM | POA: Diagnosis not present

## 2017-12-18 DIAGNOSIS — H353122 Nonexudative age-related macular degeneration, left eye, intermediate dry stage: Secondary | ICD-10-CM | POA: Diagnosis not present

## 2017-12-18 DIAGNOSIS — H353211 Exudative age-related macular degeneration, right eye, with active choroidal neovascularization: Secondary | ICD-10-CM | POA: Diagnosis not present

## 2018-01-15 DIAGNOSIS — H353211 Exudative age-related macular degeneration, right eye, with active choroidal neovascularization: Secondary | ICD-10-CM | POA: Diagnosis not present

## 2018-01-15 DIAGNOSIS — H353122 Nonexudative age-related macular degeneration, left eye, intermediate dry stage: Secondary | ICD-10-CM | POA: Diagnosis not present

## 2018-01-28 DIAGNOSIS — R51 Headache: Secondary | ICD-10-CM | POA: Diagnosis not present

## 2018-01-28 DIAGNOSIS — J329 Chronic sinusitis, unspecified: Secondary | ICD-10-CM | POA: Diagnosis not present

## 2018-01-28 DIAGNOSIS — J343 Hypertrophy of nasal turbinates: Secondary | ICD-10-CM | POA: Diagnosis not present

## 2018-01-28 DIAGNOSIS — J310 Chronic rhinitis: Secondary | ICD-10-CM | POA: Diagnosis not present

## 2018-01-28 DIAGNOSIS — J342 Deviated nasal septum: Secondary | ICD-10-CM | POA: Diagnosis not present

## 2018-01-30 DIAGNOSIS — M545 Low back pain: Secondary | ICD-10-CM | POA: Diagnosis not present

## 2018-01-30 DIAGNOSIS — J3489 Other specified disorders of nose and nasal sinuses: Secondary | ICD-10-CM | POA: Diagnosis not present

## 2018-01-30 DIAGNOSIS — Z79891 Long term (current) use of opiate analgesic: Secondary | ICD-10-CM | POA: Diagnosis not present

## 2018-01-30 DIAGNOSIS — K210 Gastro-esophageal reflux disease with esophagitis: Secondary | ICD-10-CM | POA: Diagnosis not present

## 2018-01-30 DIAGNOSIS — G4700 Insomnia, unspecified: Secondary | ICD-10-CM | POA: Diagnosis not present

## 2018-02-13 DIAGNOSIS — J343 Hypertrophy of nasal turbinates: Secondary | ICD-10-CM | POA: Diagnosis not present

## 2018-02-13 DIAGNOSIS — J342 Deviated nasal septum: Secondary | ICD-10-CM | POA: Diagnosis not present

## 2018-02-26 DIAGNOSIS — H353211 Exudative age-related macular degeneration, right eye, with active choroidal neovascularization: Secondary | ICD-10-CM | POA: Diagnosis not present

## 2018-02-26 DIAGNOSIS — H353122 Nonexudative age-related macular degeneration, left eye, intermediate dry stage: Secondary | ICD-10-CM | POA: Diagnosis not present

## 2018-03-26 DIAGNOSIS — H353122 Nonexudative age-related macular degeneration, left eye, intermediate dry stage: Secondary | ICD-10-CM | POA: Diagnosis not present

## 2018-03-26 DIAGNOSIS — H353211 Exudative age-related macular degeneration, right eye, with active choroidal neovascularization: Secondary | ICD-10-CM | POA: Diagnosis not present

## 2018-05-01 DIAGNOSIS — H353211 Exudative age-related macular degeneration, right eye, with active choroidal neovascularization: Secondary | ICD-10-CM | POA: Diagnosis not present

## 2018-05-01 DIAGNOSIS — H353122 Nonexudative age-related macular degeneration, left eye, intermediate dry stage: Secondary | ICD-10-CM | POA: Diagnosis not present

## 2018-05-12 DIAGNOSIS — E039 Hypothyroidism, unspecified: Secondary | ICD-10-CM | POA: Diagnosis not present

## 2018-05-12 DIAGNOSIS — B351 Tinea unguium: Secondary | ICD-10-CM | POA: Diagnosis not present

## 2018-05-12 DIAGNOSIS — N182 Chronic kidney disease, stage 2 (mild): Secondary | ICD-10-CM | POA: Diagnosis not present

## 2018-05-12 DIAGNOSIS — M545 Low back pain: Secondary | ICD-10-CM | POA: Diagnosis not present

## 2018-06-04 DIAGNOSIS — H353122 Nonexudative age-related macular degeneration, left eye, intermediate dry stage: Secondary | ICD-10-CM | POA: Diagnosis not present

## 2018-06-04 DIAGNOSIS — H353211 Exudative age-related macular degeneration, right eye, with active choroidal neovascularization: Secondary | ICD-10-CM | POA: Diagnosis not present

## 2018-06-09 DIAGNOSIS — B351 Tinea unguium: Secondary | ICD-10-CM | POA: Diagnosis not present

## 2018-07-08 DIAGNOSIS — H353122 Nonexudative age-related macular degeneration, left eye, intermediate dry stage: Secondary | ICD-10-CM | POA: Diagnosis not present

## 2018-07-08 DIAGNOSIS — H353211 Exudative age-related macular degeneration, right eye, with active choroidal neovascularization: Secondary | ICD-10-CM | POA: Diagnosis not present

## 2018-07-11 DIAGNOSIS — G4700 Insomnia, unspecified: Secondary | ICD-10-CM | POA: Diagnosis not present

## 2018-07-11 DIAGNOSIS — E039 Hypothyroidism, unspecified: Secondary | ICD-10-CM | POA: Diagnosis not present

## 2018-07-11 DIAGNOSIS — B351 Tinea unguium: Secondary | ICD-10-CM | POA: Diagnosis not present

## 2018-07-11 DIAGNOSIS — M545 Low back pain: Secondary | ICD-10-CM | POA: Diagnosis not present

## 2018-07-11 DIAGNOSIS — N182 Chronic kidney disease, stage 2 (mild): Secondary | ICD-10-CM | POA: Diagnosis not present

## 2018-07-12 LAB — COMPREHENSIVE METABOLIC PANEL
ALBUMIN/GLOBULIN RATIO: 1.8
ALT: 9 (ref 3–30)
AST: 18
Albumin: 3.9
Alkaline Phosphatase: 76
BUN/Creatinine Ratio: 14
BUN: 15 (ref 4–21)
Calcium: 8.9
Carbon Dioxide, Total: 31
Chloride: 104
Creat: 1.07
EGFR (African American): 59
EGFR (Non-African Amer.): 51
Globulin: 2.2
Glucose: 86
Potassium: 4.6
Sodium: 140
Total Bilirubin: 0.3
Total Protein: 6.1 — AB (ref 6.4–8.2)

## 2018-07-12 LAB — CBC
BASO(ABSOLUTE): 60
Basophils: 1.3
Eosinophils Absolute: 147
Eosinophils, %: 3.2
HCT: 38 (ref 29–41)
Hemoglobin: 12.9
Lymphocytes absolute: 1012 10*3/uL — AB (ref 0.1–1.8)
Lymphocytes: 22
MCH: 31.9
MCHC: 33.9
MCV: 94.3 (ref 76–111)
MPV: 10.7 fL (ref 0–99.8)
Monocytes(Absolute): 575
Monocytes: 12.5
Neutro Abs: 2806
Neutrophils: 61
RBC: 4.04 (ref 3.87–5.11)
RDW: 12
WBC: 4.6
platelet count: 252

## 2018-07-12 LAB — LIPID PANEL
Cholesterol, Total: 185
HDL Cholesterol: 60 (ref 35–70)
LDL Cholesterol: 104
Non HDL Cholesterol: 125
Total CHOL/HDL Ratio: 3.1
Triglycerides: 114 (ref 40–160)

## 2018-07-12 LAB — TSH
T3, Free: 2.6
T4,Free (Direct): 1
TSH: 5.64

## 2018-08-05 DIAGNOSIS — H353211 Exudative age-related macular degeneration, right eye, with active choroidal neovascularization: Secondary | ICD-10-CM | POA: Diagnosis not present

## 2018-08-05 DIAGNOSIS — H353122 Nonexudative age-related macular degeneration, left eye, intermediate dry stage: Secondary | ICD-10-CM | POA: Diagnosis not present

## 2018-08-09 DIAGNOSIS — Z23 Encounter for immunization: Secondary | ICD-10-CM | POA: Diagnosis not present

## 2018-08-12 DIAGNOSIS — N182 Chronic kidney disease, stage 2 (mild): Secondary | ICD-10-CM | POA: Diagnosis not present

## 2018-08-12 DIAGNOSIS — B351 Tinea unguium: Secondary | ICD-10-CM | POA: Diagnosis not present

## 2018-08-12 DIAGNOSIS — J301 Allergic rhinitis due to pollen: Secondary | ICD-10-CM | POA: Diagnosis not present

## 2018-08-12 DIAGNOSIS — M545 Low back pain: Secondary | ICD-10-CM | POA: Diagnosis not present

## 2018-08-18 DIAGNOSIS — B351 Tinea unguium: Secondary | ICD-10-CM | POA: Diagnosis not present

## 2018-09-02 DIAGNOSIS — H35371 Puckering of macula, right eye: Secondary | ICD-10-CM | POA: Diagnosis not present

## 2018-09-02 DIAGNOSIS — H353122 Nonexudative age-related macular degeneration, left eye, intermediate dry stage: Secondary | ICD-10-CM | POA: Diagnosis not present

## 2018-09-02 DIAGNOSIS — H353211 Exudative age-related macular degeneration, right eye, with active choroidal neovascularization: Secondary | ICD-10-CM | POA: Diagnosis not present

## 2018-10-30 DIAGNOSIS — Z0000 Encounter for general adult medical examination without abnormal findings: Secondary | ICD-10-CM | POA: Diagnosis not present

## 2018-11-12 DIAGNOSIS — E039 Hypothyroidism, unspecified: Secondary | ICD-10-CM | POA: Diagnosis not present

## 2018-11-12 DIAGNOSIS — M545 Low back pain: Secondary | ICD-10-CM | POA: Diagnosis not present

## 2018-11-12 DIAGNOSIS — J301 Allergic rhinitis due to pollen: Secondary | ICD-10-CM | POA: Diagnosis not present

## 2018-11-12 DIAGNOSIS — K5904 Chronic idiopathic constipation: Secondary | ICD-10-CM | POA: Diagnosis not present

## 2018-11-14 DIAGNOSIS — H35371 Puckering of macula, right eye: Secondary | ICD-10-CM | POA: Diagnosis not present

## 2018-11-14 DIAGNOSIS — H353211 Exudative age-related macular degeneration, right eye, with active choroidal neovascularization: Secondary | ICD-10-CM | POA: Diagnosis not present

## 2018-11-14 DIAGNOSIS — H353122 Nonexudative age-related macular degeneration, left eye, intermediate dry stage: Secondary | ICD-10-CM | POA: Diagnosis not present

## 2019-01-13 DIAGNOSIS — H35371 Puckering of macula, right eye: Secondary | ICD-10-CM | POA: Diagnosis not present

## 2019-01-13 DIAGNOSIS — H43811 Vitreous degeneration, right eye: Secondary | ICD-10-CM | POA: Diagnosis not present

## 2019-01-13 DIAGNOSIS — H353211 Exudative age-related macular degeneration, right eye, with active choroidal neovascularization: Secondary | ICD-10-CM | POA: Diagnosis not present

## 2019-01-13 DIAGNOSIS — H353122 Nonexudative age-related macular degeneration, left eye, intermediate dry stage: Secondary | ICD-10-CM | POA: Diagnosis not present

## 2019-02-12 DIAGNOSIS — E039 Hypothyroidism, unspecified: Secondary | ICD-10-CM | POA: Diagnosis not present

## 2019-02-12 DIAGNOSIS — J019 Acute sinusitis, unspecified: Secondary | ICD-10-CM | POA: Diagnosis not present

## 2019-02-12 DIAGNOSIS — K210 Gastro-esophageal reflux disease with esophagitis: Secondary | ICD-10-CM | POA: Diagnosis not present

## 2019-02-12 DIAGNOSIS — M545 Low back pain: Secondary | ICD-10-CM | POA: Diagnosis not present

## 2019-02-27 DIAGNOSIS — H353211 Exudative age-related macular degeneration, right eye, with active choroidal neovascularization: Secondary | ICD-10-CM | POA: Diagnosis not present

## 2019-04-10 DIAGNOSIS — H353211 Exudative age-related macular degeneration, right eye, with active choroidal neovascularization: Secondary | ICD-10-CM | POA: Diagnosis not present

## 2019-05-15 IMAGING — DX DG CHEST 2V
2 series · 2 of 2 positions shown · non-contrast
Comparison: Radiographs November 11, 2014.

CLINICAL DATA: Back pain.

EXAM:
CHEST  2 VIEW

[chest pa]
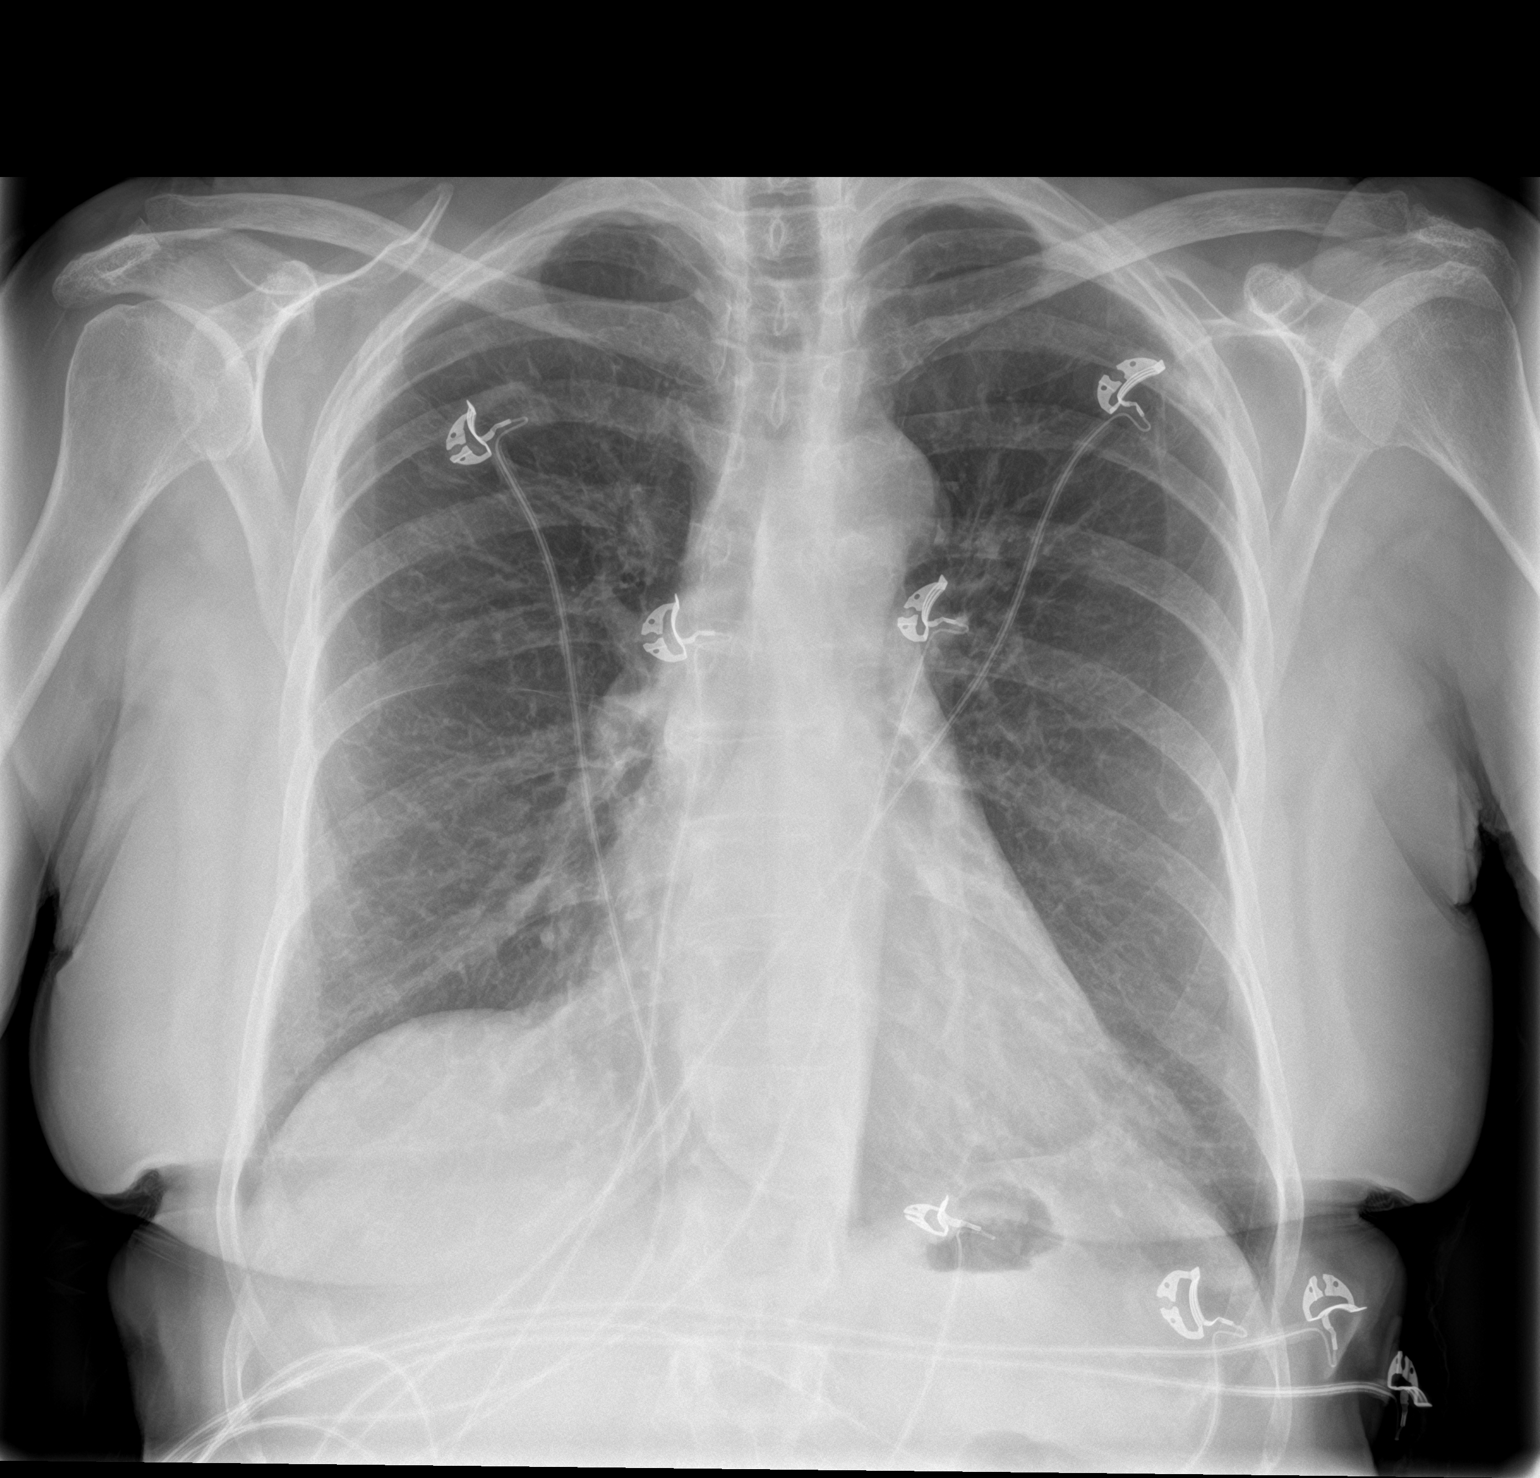

[chest lat]
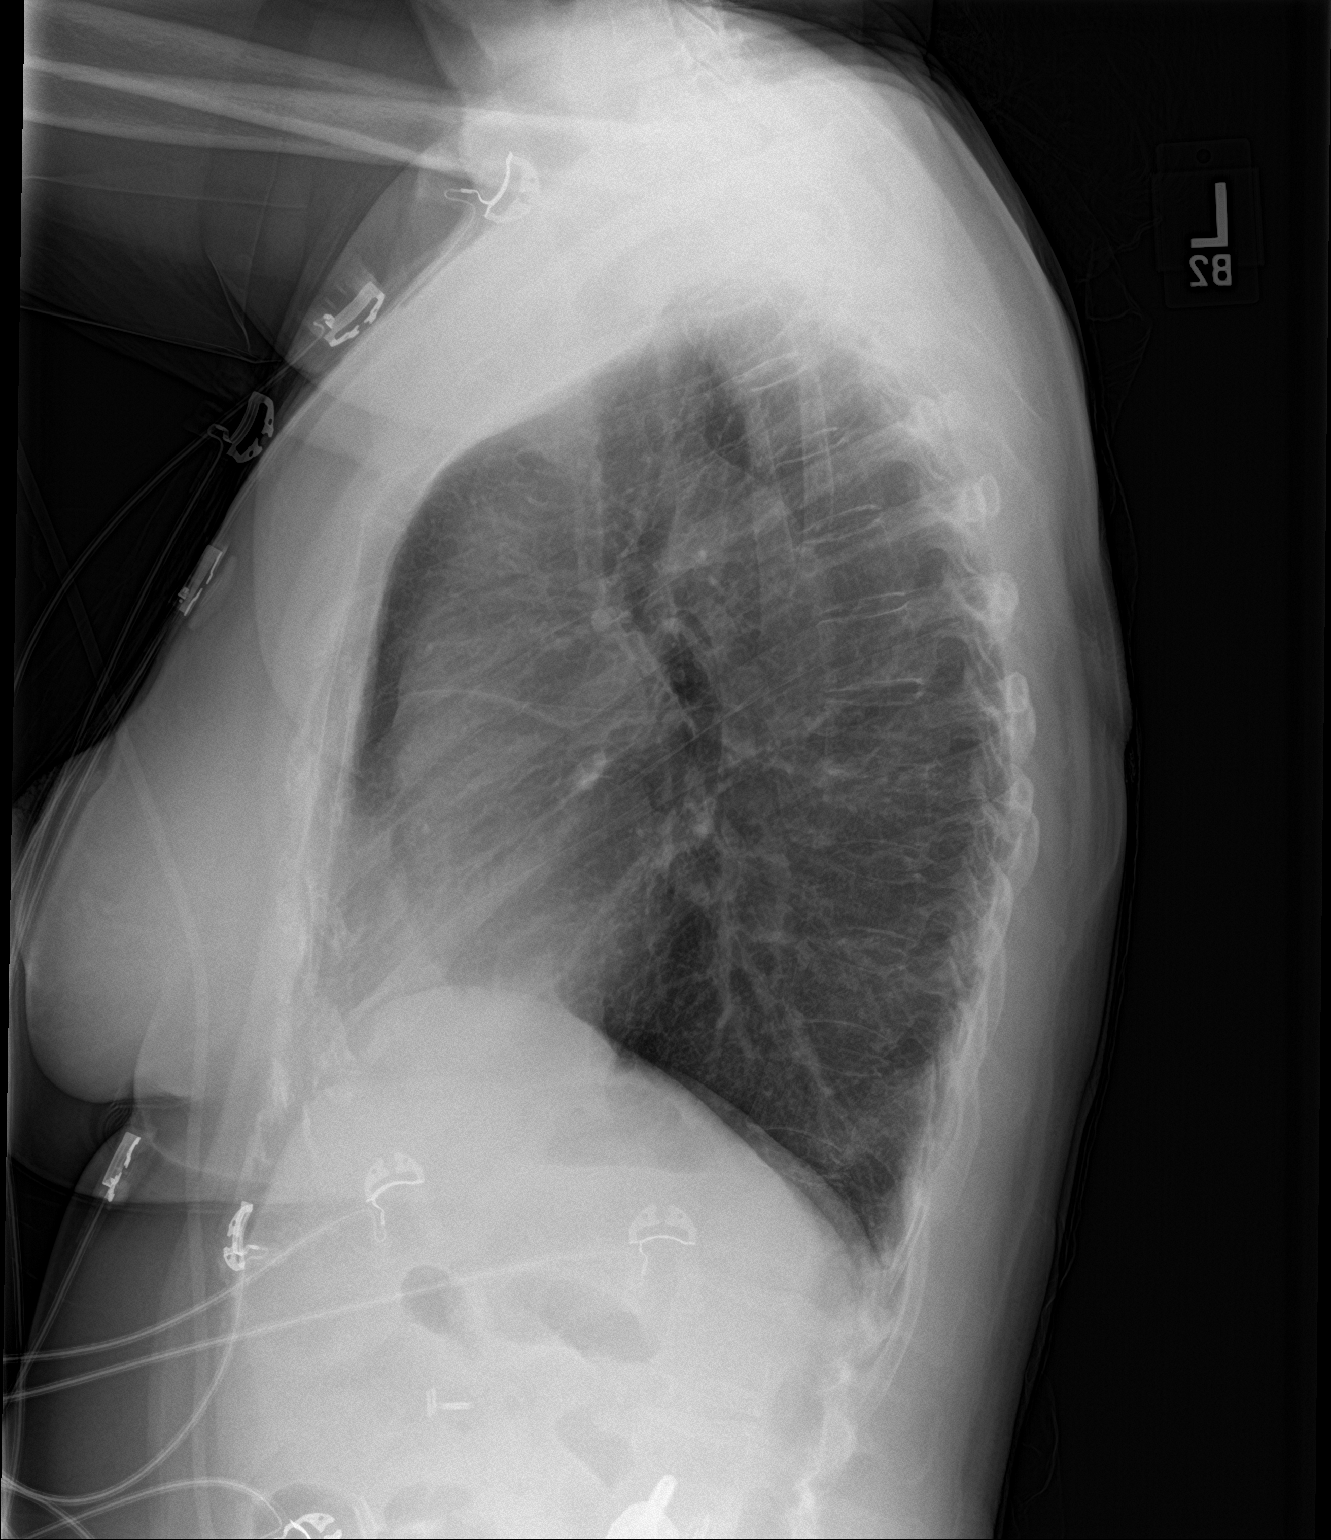

[2 of 2 positions shown; findings below may reference images not displayed]

FINDINGS: The heart size and mediastinal contours are within normal limits.
Both lungs are clear. No pneumothorax or pleural effusion is noted.
The visualized skeletal structures are unremarkable.
IMPRESSION: No active cardiopulmonary disease.

## 2019-05-25 DIAGNOSIS — M545 Low back pain: Secondary | ICD-10-CM | POA: Diagnosis not present

## 2019-05-25 DIAGNOSIS — K5904 Chronic idiopathic constipation: Secondary | ICD-10-CM | POA: Diagnosis not present

## 2019-05-25 DIAGNOSIS — K210 Gastro-esophageal reflux disease with esophagitis: Secondary | ICD-10-CM | POA: Diagnosis not present

## 2019-06-10 ENCOUNTER — Emergency Department (HOSPITAL_COMMUNITY)
Admission: EM | Admit: 2019-06-10 | Discharge: 2019-06-10 | Disposition: A | Discharge status: Home / Self Care | Payer: Medicare Other | Attending: Emergency Medicine | Admitting: Emergency Medicine

## 2019-06-10 ENCOUNTER — Encounter (HOSPITAL_COMMUNITY): Payer: Self-pay | Admitting: Emergency Medicine

## 2019-06-10 ENCOUNTER — Other Ambulatory Visit: Payer: Self-pay

## 2019-06-10 DIAGNOSIS — Y999 Unspecified external cause status: Secondary | ICD-10-CM | POA: Insufficient documentation

## 2019-06-10 DIAGNOSIS — Y929 Unspecified place or not applicable: Secondary | ICD-10-CM | POA: Diagnosis not present

## 2019-06-10 DIAGNOSIS — Z79899 Other long term (current) drug therapy: Secondary | ICD-10-CM | POA: Insufficient documentation

## 2019-06-10 DIAGNOSIS — S20469A Insect bite (nonvenomous) of unspecified back wall of thorax, initial encounter: Secondary | ICD-10-CM | POA: Diagnosis not present

## 2019-06-10 DIAGNOSIS — E039 Hypothyroidism, unspecified: Secondary | ICD-10-CM | POA: Diagnosis not present

## 2019-06-10 DIAGNOSIS — Y939 Activity, unspecified: Secondary | ICD-10-CM | POA: Diagnosis not present

## 2019-06-10 DIAGNOSIS — S30860A Insect bite (nonvenomous) of lower back and pelvis, initial encounter: Secondary | ICD-10-CM

## 2019-06-10 DIAGNOSIS — Z7982 Long term (current) use of aspirin: Secondary | ICD-10-CM | POA: Diagnosis not present

## 2019-06-10 DIAGNOSIS — W57XXXA Bitten or stung by nonvenomous insect and other nonvenomous arthropods, initial encounter: Secondary | ICD-10-CM | POA: Insufficient documentation

## 2019-06-10 DIAGNOSIS — Z85820 Personal history of malignant melanoma of skin: Secondary | ICD-10-CM | POA: Diagnosis not present

## 2019-06-10 DIAGNOSIS — S21252A Open bite of left back wall of thorax without penetration into thoracic cavity, initial encounter: Secondary | ICD-10-CM | POA: Diagnosis not present

## 2019-06-10 MED ORDER — DOXYCYCLINE HYCLATE 100 MG PO CAPS: 100.0000 mg | ORAL_CAPSULE | Freq: Two times a day (BID) | ORAL | 0 refills | Status: DC

## 2019-06-10 NOTE — ED Triage Notes (Signed)
Tick removed from left shoulder blade and left groin about 2 weeks ago.  Left shoulder is itching and red.  Pts daughter is nurse area is marked.  Small about of redness noted outside of marking.  Denies any issues with left groin.

## 2019-06-10 NOTE — Discharge Instructions (Addendum)
You are being treated for the possibility of Lyme disease infection given the appearance of this tick bite site.  Take your entire course of the doxycycline and be sure to protect your skin if you are outdoors while on this medicine (sunscreen, hat).  Follow up with Dr. Luan Pulling as needed.  Your lab tests will probably take about a week to result. A topical anti itch cream such as [place] anti-itch cream may help you better with the itch symptom.

## 2019-06-10 NOTE — ED Provider Notes (Signed)
[facility] EMERGENCY DEPARTMENT Provider Note   CSN: [id number] Arrival date & time: 06/10/19  1829    History   Chief Complaint Chief Complaint  Patient presents with  . Tick Bite    HPI [name] is a 78 y.o. female presenting for evaluation of a tick bite which occurred 2 weeks ago.  She had 2 ticks actually, one embedded in her left upper back, the other in her left groin.  Both were removed using tweezers and the groin site has completely healed, her upper back remains itchy and has spreading redness surrounding the bite site.  She denies fevers or chills, headache, neck pain or stiffness, denies rash or any other complaints at this time.  Her daughter circled the red area with a marker pen yesterday and the redness has spread beyond the marker line.  She has applied steroid cream for itch relief which has not been effective.     The history is provided by the patient.    Past Medical History:  Diagnosis Date  . Abdominal mass, right upper quadrant 08/26/2017  . Anxiety   . Cancer [facility])    SKIN CANCER  . Chronic back pain   . Chronic sinusitis   . Depression   . Fibromyalgia   . GERD (gastroesophageal reflux disease)   . Glaucoma   . Hypothyroidism   . Macular degeneration   . PONV (postoperative nausea and vomiting)     Patient Active Problem List   Diagnosis Date Noted  . Calcified mesenteric mass 10/30/2017  . Abdominal mass, right upper quadrant 08/26/2017  . Early satiety 10/23/2016  . Bloating 10/23/2016  . GERD (gastroesophageal reflux disease) 10/10/2016  . Chronic back pain 10/10/2016  . Cough 02/03/2015    Past Surgical History:  Procedure Laterality Date  . ABDOMINAL HYSTERECTOMY     1985  . ABDOMINAL HYSTERECTOMY    . back stimulator removed  11/15/2016  . back stimulatory    . BACK SURGERY     plate and screws  . BACK SURGERY     plate in back  . CATARACT EXTRACTION W/PHACO Right 03/22/2016   Procedure: CATARACT EXTRACTION PHACO AND  INTRAOCULAR LENS PLACEMENT (IOC);  Surgeon: Tonny Branch, MD;  Location: [facility];  Service: Ophthalmology;  Laterality: Right;  CDE 5.69  . CATARACT EXTRACTION W/PHACO Left 04/19/2016   Procedure: CATARACT EXTRACTION PHACO AND INTRAOCULAR LENS PLACEMENT (IOC);  Surgeon: Tonny Branch, MD;  Location: [facility];  Service: Ophthalmology;  Laterality: Left;  CDE 5.05  . CHOLECYSTECTOMY     2014, non-functioning GB  . COLONOSCOPY N/A 11/11/2015   Procedure: COLONOSCOPY;  Surgeon: Rogene Houston, MD;  Location: [facility];  Service: Endoscopy;  Laterality: N/A;  1:30  . ESOPHAGOGASTRODUODENOSCOPY N/A 12/19/2016   Procedure: ESOPHAGOGASTRODUODENOSCOPY (EGD);  Surgeon: Rogene Houston, MD;  Location: [facility];  Service: Endoscopy;  Laterality: N/A;  2:25  . LAPAROSCOPIC APPENDECTOMY N/A 10/30/2017   Procedure: APPENDECTOMY LAPAROSCOPIC;  Surgeon: Jovita Kussmaul, MD;  Location: [place];  Service: General;  Laterality: N/A;  . LAPAROSCOPIC REMOVAL OF MESENTERIC MASS N/A 10/30/2017   Procedure: LAPAROSCOPIC RESECTION OF MASS SMALL BOWEL MESENTRY;  Surgeon: Jovita Kussmaul, MD;  Location: [place];  Service: General;  Laterality: N/A;  . LAPAROSCOPIC SMALL BOWEL RESECTION N/A 10/30/2017   Procedure: LAPAROSCOPIC ASSISTED SMALL BOWEL RESECTION;  Surgeon: Jovita Kussmaul, MD;  Location: [place];  Service: General;  Laterality: N/A;  . TONSILLECTOMY  OB History   No obstetric history on file.      Home Medications    Prior to Admission medications   Medication Sig Start Date End Date Taking? Authorizing Provider  ALPRAZolam Duanne Moron) 1 MG tablet Take 1 mg at bedtime by mouth.  07/23/17   [provider]  AMITIZA 24 MCG capsule TAKE 1 CAPSULE (24 MCG TOTAL) BY MOUTH DAILY WITH BREAKFAST. 06/27/17   Setzer, Rona Ravens, NP  aspirin EC 81 MG tablet Take 81 mg daily by mouth.    [provider]  Azelastine-Fluticasone (DYMISTA) 137-50 MCG/ACT SUSP Place 2 sprays into both nostrils daily as needed (for  allergies).    [provider]  celecoxib (CELEBREX) 200 MG capsule Take 200 mg by mouth daily.    [provider]  doxycycline (VIBRAMYCIN) 100 MG capsule Take 1 capsule (100 mg total) by mouth 2 (two) times daily. 06/10/19   Evalee Jefferson, PA-C  estradiol (CLIMARA - DOSED IN MG/24 HR) 0.025 mg/24hr patch Place 0.025 mg onto the skin every Monday.  11/28/16   [provider]  FLUoxetine (PROZAC) 20 MG tablet Take 20 mg by mouth daily.    [provider]  HYDROcodone-acetaminophen (NORCO/VICODIN) 5-325 MG per tablet Take 1 tablet 3 (three) times daily as needed by mouth for moderate pain.     [provider]  levothyroxine (SYNTHROID, LEVOTHROID) 50 MCG tablet Take 50 mcg by mouth daily before breakfast.    [provider]  loratadine-pseudoephedrine (CLARITIN-D 24-HOUR) 10-240 MG 24 hr tablet Take 1 tablet daily as needed by mouth for allergies.     [provider]  OVER THE COUNTER MEDICATION Take 2 capsules daily by mouth. Premiere Formula Ocular Nutrition    [provider]  OVER THE COUNTER MEDICATION Take 1 capsule daily by mouth. Probiotic Chime Supplement    [provider]  oxyCODONE (OXY IR/ROXICODONE) 5 MG immediate release tablet Take 1-2 tablets (5-10 mg total) by mouth every 6 (six) hours as needed for moderate pain, severe pain or breakthrough pain. 11/02/17   Coralie Keens, MD  pantoprazole (PROTONIX) 40 MG tablet Take 40 mg by mouth daily.     [provider]  tobramycin (TOBREX) 0.3 % ophthalmic solution Place 1 drop into the right eye See admin instructions. Place 1 drop in right eye 4 times daily the day before, the day of and the day after eye injections    [provider]    Family History Family History  Problem Relation Age of Onset  . Allergies Mother   . Allergies Daughter   . Hodgkin's lymphoma Father   . Liver cancer Sister        "rare liver cancer"    Social History  Social History   Tobacco Use  . Smoking status: Never Smoker  . Smokeless tobacco: Never Used  Substance Use Topics  . Alcohol use: Yes    Alcohol/week: 0.0 standard drinks    Comment: rare  . Drug use: No     Allergies   Patient has no known allergies.   Review of Systems Review of Systems  Constitutional: Negative for chills and fever.  HENT: Negative.   Respiratory: Negative for shortness of breath and wheezing.   Cardiovascular: Negative.   Gastrointestinal: Negative.   Musculoskeletal: Negative for arthralgias, myalgias, neck pain and neck stiffness.  Skin: Positive for rash.  Neurological: Negative for numbness and headaches.     Physical Exam Updated Vital Signs BP 125/79 (BP Location:  Left Arm)   Pulse 71   Temp 97.6 F (36.4 C) (Oral)   Resp 14   Ht 5\' 3"  (1.6 m)   Wt 55.8 kg   SpO2 100%   BMI 21.79 kg/m   Physical Exam Constitutional:      General: She is not in acute distress.    Appearance: Normal appearance. She is well-developed.  HENT:     Head: Normocephalic.  Neck:     Musculoskeletal: Neck supple.  Cardiovascular:     Rate and Rhythm: Normal rate.  Pulmonary:     Effort: Pulmonary effort is normal. No respiratory distress.  Musculoskeletal: Normal range of motion.  Skin:    Findings: Erythema present. No rash.     Comments: Circular erythema left upper back with central punctum and faint surrounding central clearing.   No retained fb appreciated.  Neurological:     Mental Status: She is alert.      ED Treatments / Results  Labs (all labs ordered are listed, but only abnormal results are displayed) Labs Reviewed  ROCKY MTN SPOTTED FVR ABS PNL(IGG+IGM)  LYME DISEASE DNA BY PCR(BORRELIA BURG)    EKG None  Radiology No results found.  Procedures Procedures (including critical care time)  Medications Ordered in ED Medications - No data to display   Initial Impression / Assessment and Plan / ED Course  I have reviewed  the triage vital signs and the nursing notes.  Pertinent labs & imaging results that were available during my care of the patient were reviewed by me and considered in my medical decision making (see chart for details).        Patient with a known exposure to tick, 2 weeks out now with bull's-eye lesion at tick site.  Testing for [facility] spotted fever and Lyme disease was drawn.  Patient was started on doxycycline, cautioned regarding sun sensitivity while on this medication.  Plan follow-up with her PCP for any ongoing symptoms or concerns.  She is aware her lab tests are pending.  Suggested topical anti-itch cream instead of steroid cream for itch relief.  PRN follow-up anticipated.  Final Clinical Impressions(s) / ED Diagnoses   Final diagnoses:  Tick bite of back, initial encounter    ED Discharge Orders         Ordered    doxycycline (VIBRAMYCIN) 100 MG capsule  2 times daily     06/10/19 1057           Evalee Jefferson, Hershal Coria 06/10/19 1057    Milton Ferguson, MD 06/10/19 1327

## 2019-06-12 LAB — ROCKY MTN SPOTTED FVR ABS PNL(IGG+IGM)
RMSF IgG: NEGATIVE
RMSF IgM: 0.21 index (ref 0.00–0.89)

## 2019-06-29 ENCOUNTER — Other Ambulatory Visit: Payer: Self-pay

## 2019-06-30 DIAGNOSIS — H353211 Exudative age-related macular degeneration, right eye, with active choroidal neovascularization: Secondary | ICD-10-CM | POA: Diagnosis not present

## 2019-06-30 DIAGNOSIS — H353121 Nonexudative age-related macular degeneration, left eye, early dry stage: Secondary | ICD-10-CM | POA: Diagnosis not present

## 2019-06-30 DIAGNOSIS — H35371 Puckering of macula, right eye: Secondary | ICD-10-CM | POA: Diagnosis not present

## 2019-06-30 DIAGNOSIS — H43811 Vitreous degeneration, right eye: Secondary | ICD-10-CM | POA: Diagnosis not present

## 2019-08-18 DIAGNOSIS — Z23 Encounter for immunization: Secondary | ICD-10-CM | POA: Diagnosis not present

## 2019-08-19 DIAGNOSIS — H43813 Vitreous degeneration, bilateral: Secondary | ICD-10-CM | POA: Diagnosis not present

## 2019-08-19 DIAGNOSIS — H31093 Other chorioretinal scars, bilateral: Secondary | ICD-10-CM | POA: Diagnosis not present

## 2019-08-19 DIAGNOSIS — H33302 Unspecified retinal break, left eye: Secondary | ICD-10-CM | POA: Diagnosis not present

## 2019-08-19 DIAGNOSIS — H353211 Exudative age-related macular degeneration, right eye, with active choroidal neovascularization: Secondary | ICD-10-CM | POA: Diagnosis not present

## 2019-08-19 DIAGNOSIS — H353130 Nonexudative age-related macular degeneration, bilateral, stage unspecified: Secondary | ICD-10-CM | POA: Diagnosis not present

## 2019-08-19 DIAGNOSIS — H353122 Nonexudative age-related macular degeneration, left eye, intermediate dry stage: Secondary | ICD-10-CM | POA: Diagnosis not present

## 2019-08-21 DIAGNOSIS — E039 Hypothyroidism, unspecified: Secondary | ICD-10-CM | POA: Diagnosis not present

## 2019-08-21 DIAGNOSIS — K210 Gastro-esophageal reflux disease with esophagitis: Secondary | ICD-10-CM | POA: Diagnosis not present

## 2019-08-21 DIAGNOSIS — G4700 Insomnia, unspecified: Secondary | ICD-10-CM | POA: Diagnosis not present

## 2019-08-21 DIAGNOSIS — M545 Low back pain: Secondary | ICD-10-CM | POA: Diagnosis not present

## 2019-09-02 DIAGNOSIS — L570 Actinic keratosis: Secondary | ICD-10-CM | POA: Diagnosis not present

## 2019-09-02 DIAGNOSIS — D225 Melanocytic nevi of trunk: Secondary | ICD-10-CM | POA: Diagnosis not present

## 2019-09-02 DIAGNOSIS — X32XXXD Exposure to sunlight, subsequent encounter: Secondary | ICD-10-CM | POA: Diagnosis not present

## 2019-09-02 DIAGNOSIS — Z1283 Encounter for screening for malignant neoplasm of skin: Secondary | ICD-10-CM | POA: Diagnosis not present

## 2019-09-16 DIAGNOSIS — H43813 Vitreous degeneration, bilateral: Secondary | ICD-10-CM | POA: Diagnosis not present

## 2019-09-16 DIAGNOSIS — H353122 Nonexudative age-related macular degeneration, left eye, intermediate dry stage: Secondary | ICD-10-CM | POA: Diagnosis not present

## 2019-09-16 DIAGNOSIS — H353211 Exudative age-related macular degeneration, right eye, with active choroidal neovascularization: Secondary | ICD-10-CM | POA: Diagnosis not present

## 2019-10-05 ENCOUNTER — Encounter: Payer: Self-pay | Admitting: Family Medicine

## 2019-10-05 DIAGNOSIS — G894 Chronic pain syndrome: Secondary | ICD-10-CM | POA: Diagnosis not present

## 2019-10-05 DIAGNOSIS — F419 Anxiety disorder, unspecified: Secondary | ICD-10-CM | POA: Diagnosis not present

## 2019-10-05 DIAGNOSIS — Z79899 Other long term (current) drug therapy: Secondary | ICD-10-CM | POA: Diagnosis not present

## 2019-10-05 DIAGNOSIS — E559 Vitamin D deficiency, unspecified: Secondary | ICD-10-CM | POA: Diagnosis not present

## 2019-10-05 DIAGNOSIS — M129 Arthropathy, unspecified: Secondary | ICD-10-CM | POA: Diagnosis not present

## 2019-10-05 DIAGNOSIS — M545 Low back pain: Secondary | ICD-10-CM | POA: Diagnosis not present

## 2019-10-08 ENCOUNTER — Other Ambulatory Visit: Payer: Self-pay | Admitting: Otolaryngology

## 2019-10-08 ENCOUNTER — Other Ambulatory Visit: Payer: Self-pay

## 2019-10-08 ENCOUNTER — Ambulatory Visit (INDEPENDENT_AMBULATORY_CARE_PROVIDER_SITE_OTHER): Payer: Medicare Other | Admitting: Otolaryngology

## 2019-10-08 ENCOUNTER — Other Ambulatory Visit (HOSPITAL_COMMUNITY): Payer: Self-pay | Admitting: Otolaryngology

## 2019-10-08 DIAGNOSIS — J329 Chronic sinusitis, unspecified: Secondary | ICD-10-CM

## 2019-10-16 ENCOUNTER — Ambulatory Visit (HOSPITAL_COMMUNITY): Payer: Medicare Other | Attending: Otolaryngology

## 2019-10-16 ENCOUNTER — Encounter (HOSPITAL_COMMUNITY): Payer: Self-pay

## 2019-10-21 ENCOUNTER — Other Ambulatory Visit: Payer: Self-pay

## 2019-10-29 ENCOUNTER — Ambulatory Visit (HOSPITAL_COMMUNITY)
Admission: RE | Admit: 2019-10-29 | Discharge: 2019-10-29 | Disposition: A | Discharge status: Home / Self Care | Payer: Medicare Other | Source: Ambulatory Visit | Attending: Otolaryngology | Admitting: Otolaryngology

## 2019-10-29 ENCOUNTER — Other Ambulatory Visit: Payer: Self-pay

## 2019-10-29 DIAGNOSIS — J329 Chronic sinusitis, unspecified: Secondary | ICD-10-CM | POA: Diagnosis not present

## 2019-10-29 DIAGNOSIS — R519 Headache, unspecified: Secondary | ICD-10-CM | POA: Diagnosis not present

## 2019-10-30 DIAGNOSIS — H31093 Other chorioretinal scars, bilateral: Secondary | ICD-10-CM | POA: Diagnosis not present

## 2019-10-30 DIAGNOSIS — H353211 Exudative age-related macular degeneration, right eye, with active choroidal neovascularization: Secondary | ICD-10-CM | POA: Diagnosis not present

## 2019-10-30 DIAGNOSIS — H43813 Vitreous degeneration, bilateral: Secondary | ICD-10-CM | POA: Diagnosis not present

## 2019-10-30 DIAGNOSIS — H353122 Nonexudative age-related macular degeneration, left eye, intermediate dry stage: Secondary | ICD-10-CM | POA: Diagnosis not present

## 2019-11-03 DIAGNOSIS — G894 Chronic pain syndrome: Secondary | ICD-10-CM | POA: Diagnosis not present

## 2019-11-03 DIAGNOSIS — Z79899 Other long term (current) drug therapy: Secondary | ICD-10-CM | POA: Diagnosis not present

## 2019-11-03 DIAGNOSIS — M545 Low back pain: Secondary | ICD-10-CM | POA: Diagnosis not present

## 2019-11-24 ENCOUNTER — Ambulatory Visit: Payer: Medicare Other | Admitting: Family Medicine

## 2019-12-03 ENCOUNTER — Encounter: Payer: Self-pay | Admitting: Family Medicine

## 2019-12-03 ENCOUNTER — Ambulatory Visit (INDEPENDENT_AMBULATORY_CARE_PROVIDER_SITE_OTHER): Payer: Medicare Other | Admitting: Family Medicine

## 2019-12-03 ENCOUNTER — Other Ambulatory Visit: Payer: Self-pay

## 2019-12-03 VITALS — BP 114/74 | HR 68 | Temp 98.1°F | Resp 8 | Ht 63.0 in | Wt 132.8 lb

## 2019-12-03 DIAGNOSIS — G4700 Insomnia, unspecified: Secondary | ICD-10-CM | POA: Diagnosis not present

## 2019-12-03 DIAGNOSIS — K219 Gastro-esophageal reflux disease without esophagitis: Secondary | ICD-10-CM

## 2019-12-03 DIAGNOSIS — M2550 Pain in unspecified joint: Secondary | ICD-10-CM | POA: Insufficient documentation

## 2019-12-03 DIAGNOSIS — Z780 Asymptomatic menopausal state: Secondary | ICD-10-CM | POA: Diagnosis not present

## 2019-12-03 DIAGNOSIS — Z1231 Encounter for screening mammogram for malignant neoplasm of breast: Secondary | ICD-10-CM | POA: Diagnosis not present

## 2019-12-03 DIAGNOSIS — E785 Hyperlipidemia, unspecified: Secondary | ICD-10-CM | POA: Diagnosis not present

## 2019-12-03 DIAGNOSIS — E039 Hypothyroidism, unspecified: Secondary | ICD-10-CM

## 2019-12-03 MED ORDER — AMITRIPTYLINE HCL 10 MG PO TABS: 10.0000 mg | ORAL_TABLET | Freq: Every day | ORAL | 1 refills | Status: DC

## 2019-12-03 MED ORDER — LEVOTHYROXINE SODIUM 75 MCG PO TABS: 75.0000 ug | ORAL_TABLET | Freq: Every day | ORAL | 3 refills | Status: AC

## 2019-12-03 NOTE — Patient Instructions (Addendum)
Trial of amitriptyline  Obtain labwork to review No anti-inflammatory Tylenol 500mg  -max 3 grams /day Decide if you want behavioral referral to [facility]

## 2019-12-03 NOTE — Progress Notes (Signed)
New Patient Office Visit  Subjective:  Patient ID: [name], female    DOB: 19-Nov-1941  Age: 79 y.o. MRN: [id number]  CC:  Chief Complaint  Patient presents with  . Establish Care    New pt appointment  Prescriptions Total Prescriptions: 17   Total Private Pay: 9   Fill Date ID   Written Drug Qty Days Prescriber Rx # Pharmacy Refill   Daily Dose* Pymt Type PMP    11/18/2019  2   11/03/2019  Hydrocodone-Acetamin 5-325 MG  120.00  30 [place]   [id number]   [place] ([id number])   0  20.00 MME  Medicare   [place]  10/20/2019  2   07/20/2019  Alprazolam 2 MG Tablet  90.00  90 Ed Haw   [id number]   [place] ([id number])   1  4.00 LME  Medicare   [place]  08/21/2019  2   08/21/2019  Hydrocodone-Acetamin 5-325 MG  360.00  90 Ed Haw   [id number]   [place] ([id number])   0  20.00 MME  Medicare   [place]  07/20/2019  2   07/20/2019  Alprazolam 2 MG Tablet  90.00  90 Ed Haw   [id number]   [place] ([id number])   0  4.00 LME  Private Pay   [place]  05/25/2019  2   05/25/2019  Hydrocodone-Acetamin 5-325 MG  360.00  90 Ed Haw   [id number]   [place] ([id number])   0  20.00 MME  Private Pay   [place]  04/23/2019  2   01/22/2019  Alprazolam 2 MG Tablet  90.00  90 Ed Haw   [id number]   [place] (5448)   1  4.00 LME  Private Pay   [place]  02/22/2019  2   02/19/2019  Hydrocodone-Acetamin 5-325 MG  360.00  90 Ed Haw   [id number]   [place] (5448)   0  20.00 MME  Private Pay   [place]  01/22/2019  2   01/22/2019  Alprazolam 2 MG Tablet  90.00  90 Ed Haw   [id number]   [place] (5448)   0  4.00 LME  Private Pay   [place]  11/14/2018  2   11/12/2018  Hydrocodone-Acetamin 5-325 MG  90.00  22 Ed Haw   [id number]   [place] (5448)   0  20.45 MME  Medicare   [place]  11/14/2018  2   11/12/2018  Hydrocodone-Acetamin 5-325 MG  360.00  90 Ed Haw   [id number]   [place] (5448)   0  20.00 MME  Medicare   [place]  10/21/2018  2   07/21/2018  Alprazolam 2 MG Tablet  90.00  90 Ed Haw   [id number]   [place] (5448)   1  4.00 LME  Private Pay   [place]  08/17/2018  1   08/12/2018  Hydrocodone-Acetamin 5-325 MG  360.00  90 Ed Haw   [id number]   [place] (5448)   0  20.00 MME  Medicare    [place]  07/21/2018  1   07/21/2018  Alprazolam 2 MG Tablet  90.00  90 Ed Haw   [id number]   [place] (5448)   0  4.00 LME  Private Pay   [place]  05/14/2018  1   05/12/2018  Hydrocodone-Acetamin 5-325 MG  360.00  90 Ed Haw   [id number]   [place] (5448)   0  20.00 MME  Medicare   [place]  04/19/2018  1   01/20/2018  Alprazolam 2 MG Tablet  90.00  90  Kathi Ludwig   [id number]   [place] [phone])   1  4.00 LME  Private Pay   [place]  02/04/2018  1   01/30/2018  Hydrocodone-Acetamin 5-325 MG  360.00  90 Ed Haw   [id number]   [place] (5448)   0  20.00 MME  Medicare   [place]  01/20/2018  1   01/20/2018  Alprazolam 2 MG Tablet  90.00  90 Ed Haw   [id number]   [place] (5448)   0  4.00 LME  Private Pay   [place]  *Per CDC guidance, the MME conversion factors prescribed or provided as part of the medication-assisted treatment for opioid use disorder should not be used to benchmark against dosage thresholds    HPI [name] presents for arthralgia-kidney changes noted on labwork  Past Medical History:  Diagnosis Date  . Abdominal mass, right upper quadrant 08/26/2017  . Allergy   . Anxiety   . Arthritis   . Cancer [facility])    SKIN CANCER  . Cataract   . Chronic back pain   . Chronic kidney disease   . Chronic sinusitis   . Depression   . Fibromyalgia   . GERD (gastroesophageal reflux disease)   . Glaucoma   . Hypothyroidism   . Macular degeneration   . PONV (postoperative nausea and vomiting)     Past Surgical History:  Procedure Laterality Date  . ABDOMINAL HYSTERECTOMY     1985  . ABDOMINAL HYSTERECTOMY    . APPENDECTOMY    . back stimulator removed  11/15/2016  . back stimulatory    . BACK SURGERY     plate and screws  . BACK SURGERY     plate in back  . CATARACT EXTRACTION W/PHACO Right 03/22/2016   Procedure: CATARACT EXTRACTION PHACO AND INTRAOCULAR LENS PLACEMENT (IOC);  Surgeon: Tonny Branch, MD;  Location: [facility];  Service: Ophthalmology;  Laterality: Right;  CDE 5.69  . CATARACT EXTRACTION W/PHACO Left 04/19/2016   Procedure: CATARACT  EXTRACTION PHACO AND INTRAOCULAR LENS PLACEMENT (IOC);  Surgeon: Tonny Branch, MD;  Location: [facility];  Service: Ophthalmology;  Laterality: Left;  CDE 5.05  . CHOLECYSTECTOMY     2014, non-functioning GB  . COLONOSCOPY N/A 11/11/2015   Procedure: COLONOSCOPY;  Surgeon: Rogene Houston, MD;  [facility];  Service: Endoscopy;  Laterality: N/A;  1:30  . COSMETIC SURGERY    . ESOPHAGOGASTRODUODENOSCOPY N/A 12/19/2016   Procedure: ESOPHAGOGASTRODUODENOSCOPY (EGD);  Surgeon: Rogene Houston, MD;  [facility];  Service: Endoscopy;  Laterality: N/A;  2:25  . EYE SURGERY    . LAPAROSCOPIC APPENDECTOMY N/A 10/30/2017   Procedure: APPENDECTOMY LAPAROSCOPIC;  Surgeon: Jovita Kussmaul, MD;  Location: [place];  Service: General;  Laterality: N/A;  . LAPAROSCOPIC REMOVAL OF MESENTERIC MASS N/A 10/30/2017   Procedure: LAPAROSCOPIC RESECTION OF MASS SMALL BOWEL MESENTRY;  Surgeon: Jovita Kussmaul, MD;  Location: [place];  Service: General;  Laterality: N/A;  . LAPAROSCOPIC SMALL BOWEL RESECTION N/A 10/30/2017   Procedure: LAPAROSCOPIC ASSISTED SMALL BOWEL RESECTION;  Surgeon: Jovita Kussmaul, MD;  Location: [place];  Service: General;  Laterality: N/A;  . SMALL INTESTINE SURGERY    . TONSILLECTOMY      Family History  Problem Relation Age of Onset  . Allergies Mother   . Heart disease Mother   . Hyperlipidemia Mother   . Allergies Daughter   . Hodgkin's lymphoma Father   . Liver cancer Sister        "  rare liver cancer"    Social History   Socioeconomic History  . Marital status: Married    Spouse name: Not on file  . Number of children: Not on file  . Years of education: Not on file  . Highest education level: Not on file  Occupational History  . Occupation: Retired   Tobacco Use  . Smoking status: Never Smoker  . Smokeless tobacco: Never Used  Substance and Sexual Activity  . Alcohol use: Yes    Alcohol/week: 0.0 standard drinks    Comment: rare  . Drug use: No  . Sexual  activity: Yes    Birth control/protection: Surgical  Other Topics Concern  . Not on file  Social History Narrative  . Not on file   Social Determinants of Health   Financial Resource Strain:   . Difficulty of Paying Living Expenses: Not on file  Food Insecurity:   . Worried About Charity fundraiser in the Last Year: Not on file  . Ran Out of Food in the Last Year: Not on file  Transportation Needs:   . Lack of Transportation (Medical): Not on file  . Lack of Transportation (Non-Medical): Not on file  Physical Activity:   . Days of Exercise per Week: Not on file  . Minutes of Exercise per Session: Not on file  Stress:   . Feeling of Stress : Not on file  Social Connections:   . Frequency of Communication with Friends and Family: Not on file  . Frequency of Social Gatherings with Friends and Family: Not on file  . Attends Religious Services: Not on file  . Active Member of Clubs or Organizations: Not on file  . Attends Archivist Meetings: Not on file  . Marital Status: Not on file  Intimate Partner Violence:   . Fear of Current or Ex-Partner: Not on file  . Emotionally Abused: Not on file  . Physically Abused: Not on file  . Sexually Abused: Not on file    ROS Review of Systems  Constitutional: Positive for fatigue.  HENT: Positive for dental problem and sinus pressure.   Eyes: Positive for photophobia, itching and visual disturbance.  Endocrine: Negative.   Musculoskeletal: Positive for arthralgias, back pain, joint swelling, myalgias, neck pain and neck stiffness.  Allergic/Immunologic: Positive for environmental allergies.  Neurological: Positive for headaches.  Psychiatric/Behavioral: Negative for sleep disturbance.    Objective:   Today's Vitals: BP 114/74   Pulse 68   Temp 98.1 F (36.7 C)   Resp (!) 8   Ht 5\' 3"  (1.6 m)   Wt 132 lb 12.8 oz (60.2 kg)   BMI 23.52 kg/m   Physical Exam Constitutional:      Appearance: Normal appearance.   HENT:     Head: Normocephalic and atraumatic.  Cardiovascular:     Rate and Rhythm: Normal rate and regular rhythm.     Pulses: Normal pulses.     Heart sounds: Normal heart sounds.  Musculoskeletal:     Cervical back: Normal range of motion and neck supple.  Neurological:     Mental Status: She is alert and oriented to person, place, and time.  Psychiatric:        Mood and Affect: Mood normal.        Behavior: Behavior normal.     Assessment & Plan:   1. Arthralgia, unspecified joint Diagnosed with RA in the past-no recent evaluation-treated with gold infections-currently seeing pain management for oral narcotics to treat pain -  Ambulatory referral to Rheumatology [place] for muscle spasms 2. Gastroesophageal reflux disease without esophagitis protonix 3. Menopause - DG Bone Density; Future, pt takes estrogen 4. Encounter for breast cancer screening using non-mammogram modality - MM Digital Screening; Future 5. Hyperlipidemia, unspecified hyperlipidemia type No meds currently - Lipid panel 6. Hypothyroidism, unspecified type Synthroid-need level - TSH D/w pt at length-will be unable to prescribe Xanax for sleep-suggested Psy for evaluation-trial of elavil-pt with diagnosis of fibromyalgia impacting sleep. Outpatient Encounter Medications as of 12/03/2019  Medication Sig  . ALPRAZolam (XANAX) 1 MG tablet Take 1 mg at bedtime by mouth.   . Azelastine-Fluticasone (DYMISTA) 137-50 MCG/ACT SUSP Place 2 sprays into both nostrils daily as needed (for allergies).  . celecoxib (CELEBREX) 200 MG capsule Take 200 mg by mouth daily.  Marland Kitchen estradiol (CLIMARA - DOSED IN MG/24 HR) 0.025 mg/24hr patch Place 0.025 mg onto the skin every Monday.   Marland Kitchen FLUoxetine (PROZAC) 20 MG tablet Take 20 mg by mouth daily.  Marland Kitchen HYDROcodone-acetaminophen (NORCO/VICODIN) 5-325 MG per tablet Take 1 tablet 3 (three) times daily as needed by mouth for moderate pain.   Marland Kitchen levothyroxine (SYNTHROID, LEVOTHROID) 50  MCG tablet Take 50 mcg by mouth daily before breakfast.  . OVER THE COUNTER MEDICATION Take 2 capsules daily by mouth. Premiere Formula Ocular Nutrition  . OVER THE COUNTER MEDICATION Take 1 capsule daily by mouth. Probiotic Chime Supplement  . pantoprazole (PROTONIX) 40 MG tablet Take 40 mg by mouth daily.   . [DISCONTINUED] AMITIZA 24 MCG capsule TAKE 1 CAPSULE (24 MCG TOTAL) BY MOUTH DAILY WITH BREAKFAST.  . [DISCONTINUED] aspirin EC 81 MG tablet Take 81 mg daily by mouth.  . [DISCONTINUED] doxycycline (VIBRAMYCIN) 100 MG capsule Take 1 capsule (100 mg total) by mouth 2 (two) times daily.  . [DISCONTINUED] loratadine-pseudoephedrine (CLARITIN-D 24-HOUR) 10-240 MG 24 hr tablet Take 1 tablet daily as needed by mouth for allergies.   . [DISCONTINUED] oxyCODONE (OXY IR/ROXICODONE) 5 MG immediate release tablet Take 1-2 tablets (5-10 mg total) by mouth every 6 (six) hours as needed for moderate pain, severe pain or breakthrough pain.  . [DISCONTINUED] tobramycin (TOBREX) 0.3 % ophthalmic solution Place 1 drop into the right eye See admin instructions. Place 1 drop in right eye 4 times daily the day before, the day of and the day after eye injections   No facility-administered encounter medications on file as of 12/03/2019.  50 minutes spent in review of old records, history, labwork, physical exam, assessment and plan development  Follow-up: suggested behavioral health for evaluation-pt declined Trial of amitripltline + prozac-increase swimming yoga -do not recommend Xanax for sleep-risk d/w pt  Adelee Hannula Hannah Beat, MD

## 2019-12-04 DIAGNOSIS — Z79899 Other long term (current) drug therapy: Secondary | ICD-10-CM | POA: Diagnosis not present

## 2019-12-04 DIAGNOSIS — M545 Low back pain: Secondary | ICD-10-CM | POA: Diagnosis not present

## 2019-12-04 DIAGNOSIS — G894 Chronic pain syndrome: Secondary | ICD-10-CM | POA: Diagnosis not present

## 2019-12-07 DIAGNOSIS — Z1231 Encounter for screening mammogram for malignant neoplasm of breast: Secondary | ICD-10-CM | POA: Insufficient documentation

## 2019-12-07 DIAGNOSIS — Z780 Asymptomatic menopausal state: Secondary | ICD-10-CM | POA: Insufficient documentation

## 2019-12-07 DIAGNOSIS — E785 Hyperlipidemia, unspecified: Secondary | ICD-10-CM | POA: Insufficient documentation

## 2019-12-07 DIAGNOSIS — E039 Hypothyroidism, unspecified: Secondary | ICD-10-CM | POA: Insufficient documentation

## 2019-12-07 DIAGNOSIS — G4700 Insomnia, unspecified: Secondary | ICD-10-CM | POA: Insufficient documentation

## 2019-12-18 ENCOUNTER — Ambulatory Visit (HOSPITAL_COMMUNITY)
Admission: RE | Admit: 2019-12-18 | Discharge: 2019-12-18 | Disposition: A | Discharge status: Home / Self Care | Payer: Medicare Other | Source: Ambulatory Visit | Attending: Family Medicine | Admitting: Family Medicine

## 2019-12-18 ENCOUNTER — Other Ambulatory Visit: Payer: Self-pay

## 2019-12-18 DIAGNOSIS — Z780 Asymptomatic menopausal state: Secondary | ICD-10-CM | POA: Diagnosis not present

## 2019-12-18 DIAGNOSIS — Z1231 Encounter for screening mammogram for malignant neoplasm of breast: Secondary | ICD-10-CM | POA: Diagnosis not present

## 2019-12-18 DIAGNOSIS — M8589 Other specified disorders of bone density and structure, multiple sites: Secondary | ICD-10-CM | POA: Diagnosis not present

## 2019-12-21 ENCOUNTER — Other Ambulatory Visit (HOSPITAL_COMMUNITY): Payer: Self-pay | Admitting: Family Medicine

## 2019-12-21 DIAGNOSIS — R928 Other abnormal and inconclusive findings on diagnostic imaging of breast: Secondary | ICD-10-CM

## 2019-12-22 ENCOUNTER — Other Ambulatory Visit: Payer: Self-pay | Admitting: Family Medicine

## 2019-12-22 DIAGNOSIS — R928 Other abnormal and inconclusive findings on diagnostic imaging of breast: Secondary | ICD-10-CM

## 2019-12-29 ENCOUNTER — Ambulatory Visit (HOSPITAL_COMMUNITY)
Admission: RE | Admit: 2019-12-29 | Discharge: 2019-12-29 | Disposition: A | Discharge status: Home / Self Care | Payer: Medicare Other | Source: Ambulatory Visit | Attending: Family Medicine | Admitting: Family Medicine

## 2019-12-29 ENCOUNTER — Other Ambulatory Visit: Payer: Self-pay

## 2019-12-29 DIAGNOSIS — R928 Other abnormal and inconclusive findings on diagnostic imaging of breast: Secondary | ICD-10-CM | POA: Diagnosis not present

## 2019-12-29 DIAGNOSIS — R922 Inconclusive mammogram: Secondary | ICD-10-CM | POA: Diagnosis not present

## 2019-12-29 DIAGNOSIS — N6011 Diffuse cystic mastopathy of right breast: Secondary | ICD-10-CM | POA: Diagnosis not present

## 2019-12-29 DIAGNOSIS — N6012 Diffuse cystic mastopathy of left breast: Secondary | ICD-10-CM | POA: Diagnosis not present

## 2020-01-04 DIAGNOSIS — M1990 Unspecified osteoarthritis, unspecified site: Secondary | ICD-10-CM | POA: Diagnosis not present

## 2020-01-04 DIAGNOSIS — G894 Chronic pain syndrome: Secondary | ICD-10-CM | POA: Diagnosis not present

## 2020-01-04 DIAGNOSIS — Z79899 Other long term (current) drug therapy: Secondary | ICD-10-CM | POA: Diagnosis not present

## 2020-01-05 ENCOUNTER — Ambulatory Visit (INDEPENDENT_AMBULATORY_CARE_PROVIDER_SITE_OTHER): Payer: Medicare Other | Admitting: Family Medicine

## 2020-01-05 ENCOUNTER — Other Ambulatory Visit: Payer: Self-pay

## 2020-01-05 ENCOUNTER — Encounter: Payer: Self-pay | Admitting: Family Medicine

## 2020-01-05 VITALS — BP 130/79 | HR 65 | Temp 97.7°F | Ht 63.0 in | Wt 130.6 lb

## 2020-01-05 DIAGNOSIS — G4700 Insomnia, unspecified: Secondary | ICD-10-CM

## 2020-01-05 DIAGNOSIS — M2550 Pain in unspecified joint: Secondary | ICD-10-CM | POA: Diagnosis not present

## 2020-01-05 MED ORDER — AMITRIPTYLINE HCL 50 MG PO TABS: 50.0000 mg | ORAL_TABLET | Freq: Every day | ORAL | 2 refills | Status: DC

## 2020-01-05 NOTE — Patient Instructions (Signed)
Increase amitriptyline to 50mg  daily Continue melatonin Wean Xanax

## 2020-01-05 NOTE — Progress Notes (Signed)
Established Patient Office Visit  Subjective:  Patient ID: [name], female    DOB: 24-Oct-1941  Age: 79 y.o. MRN: [id number]  CC:  Chief Complaint  Patient presents with  . Follow-up  . Medication Management    amitriptyline    HPI [name] presents for  Osteopenia-pt takes calcium, can not take Vit D due to Lupus Insomnia-tried to wean xanax-taking amitriptyline 10mg -wants a higher dose to manage complete wean off xanax Past Medical History:  Diagnosis Date  . Abdominal mass, right upper quadrant 08/26/2017  . Allergy   . Anxiety   . Arthritis   . Cancer [facility])    SKIN CANCER  . Cataract   . Chronic back pain   . Chronic kidney disease   . Chronic sinusitis   . Depression   . Fibromyalgia   . GERD (gastroesophageal reflux disease)   . Glaucoma   . Hypothyroidism   . Macular degeneration   . PONV (postoperative nausea and vomiting)     Past Surgical History:  Procedure Laterality Date  . ABDOMINAL HYSTERECTOMY     1985  . ABDOMINAL HYSTERECTOMY    . APPENDECTOMY    . back stimulator removed  11/15/2016  . back stimulatory    . BACK SURGERY     plate and screws  . BACK SURGERY     plate in back  . CATARACT EXTRACTION W/PHACO Right 03/22/2016   Procedure: CATARACT EXTRACTION PHACO AND INTRAOCULAR LENS PLACEMENT (IOC);  Surgeon: Tonny Branch, MD;  Location: [facility];  Service: Ophthalmology;  Laterality: Right;  CDE 5.69  . CATARACT EXTRACTION W/PHACO Left 04/19/2016   Procedure: CATARACT EXTRACTION PHACO AND INTRAOCULAR LENS PLACEMENT (IOC);  Surgeon: Tonny Branch, MD;  Location: [facility];  Service: Ophthalmology;  Laterality: Left;  CDE 5.05  . CHOLECYSTECTOMY     2014, non-functioning GB  . COLONOSCOPY N/A 11/11/2015   Procedure: COLONOSCOPY;  Surgeon: Rogene Houston, MD;  [facility];  Service: Endoscopy;  Laterality: N/A;  1:30  . COSMETIC SURGERY    . ESOPHAGOGASTRODUODENOSCOPY N/A 12/19/2016   Procedure: ESOPHAGOGASTRODUODENOSCOPY (EGD);   Surgeon: Rogene Houston, MD;  [facility];  Service: Endoscopy;  Laterality: N/A;  2:25  . EYE SURGERY    . LAPAROSCOPIC APPENDECTOMY N/A 10/30/2017   Procedure: APPENDECTOMY LAPAROSCOPIC;  Surgeon: Jovita Kussmaul, MD;  Location: [place];  Service: General;  Laterality: N/A;  . LAPAROSCOPIC REMOVAL OF MESENTERIC MASS N/A 10/30/2017   Procedure: LAPAROSCOPIC RESECTION OF MASS SMALL BOWEL MESENTRY;  Surgeon: Jovita Kussmaul, MD;  Location: [place];  Service: General;  Laterality: N/A;  . LAPAROSCOPIC SMALL BOWEL RESECTION N/A 10/30/2017   Procedure: LAPAROSCOPIC ASSISTED SMALL BOWEL RESECTION;  Surgeon: Jovita Kussmaul, MD;  Location: [place];  Service: General;  Laterality: N/A;  . SMALL INTESTINE SURGERY    . TONSILLECTOMY      Family History  Problem Relation Age of Onset  . Allergies Mother   . Heart disease Mother   . Hyperlipidemia Mother   . Allergies Daughter   . Hodgkin's lymphoma Father   . Liver cancer Sister        "rare liver cancer"    Social History   Socioeconomic History  . Marital status: Married    Spouse name: Not on file  . Number of children: Not on file  . Years of education: Not on file  . Highest education level: Not on file  Occupational History  .  Occupation: Retired   Tobacco Use  . Smoking status: Never Smoker  . Smokeless tobacco: Never Used  Substance and Sexual Activity  . Alcohol use: Yes    Alcohol/week: 0.0 standard drinks    Comment: rare  . Drug use: No  . Sexual activity: Yes    Birth control/protection: Surgical  Other Topics Concern  . Not on file  Social History Narrative  . Not on file   Social Determinants of Health   Financial Resource Strain:   . Difficulty of Paying Living Expenses: Not on file  Food Insecurity:   . Worried About Charity fundraiser in the Last Year: Not on file  . Ran Out of Food in the Last Year: Not on file  Transportation Needs:   . Lack of Transportation (Medical): Not on file  . Lack of  Transportation (Non-Medical): Not on file  Physical Activity:   . Days of Exercise per Week: Not on file  . Minutes of Exercise per Session: Not on file  Stress:   . Feeling of Stress : Not on file  Social Connections:   . Frequency of Communication with Friends and Family: Not on file  . Frequency of Social Gatherings with Friends and Family: Not on file  . Attends Religious Services: Not on file  . Active Member of Clubs or Organizations: Not on file  . Attends Archivist Meetings: Not on file  . Marital Status: Not on file  Intimate Partner Violence:   . Fear of Current or Ex-Partner: Not on file  . Emotionally Abused: Not on file  . Physically Abused: Not on file  . Sexually Abused: Not on file    Outpatient Medications Prior to Visit  Medication Sig Dispense Refill  . ALPRAZolam (XANAX) 1 MG tablet Take 1 mg at bedtime by mouth.   5  . amitriptyline (ELAVIL) 10 MG tablet Take 1 tablet (10 mg total) by mouth at bedtime. 30 tablet 1  . Azelastine-Fluticasone (DYMISTA) 137-50 MCG/ACT SUSP Place 2 sprays into both nostrils daily as needed (for allergies).    Marland Kitchen estradiol (CLIMARA - DOSED IN MG/24 HR) 0.025 mg/24hr patch Place 0.025 mg onto the skin every Monday.   6  . FLUoxetine (PROZAC) 20 MG tablet Take 20 mg by mouth daily.    Marland Kitchen HYDROcodone-acetaminophen (NORCO/VICODIN) 5-325 MG per tablet Take 1 tablet 3 (three) times daily as needed by mouth for moderate pain.     Marland Kitchen levothyroxine (SYNTHROID) 75 MCG tablet Take 1 tablet (75 mcg total) by mouth daily. 90 tablet 3  . OVER THE COUNTER MEDICATION Take 2 capsules daily by mouth. Premiere Formula Ocular Nutrition    . OVER THE COUNTER MEDICATION Take 1 capsule daily by mouth. Probiotic Chime Supplement    . pantoprazole (PROTONIX) 40 MG tablet Take 40 mg by mouth daily.     . celecoxib (CELEBREX) 200 MG capsule Take 200 mg by mouth daily.     No facility-administered medications prior to visit.    Allergies  Allergen  Reactions  . Doxycycline     ROS Review of Systems  Constitutional: Negative.   Musculoskeletal: Negative.   Psychiatric/Behavioral: Positive for sleep disturbance. Negative for agitation and behavioral problems. The patient is nervous/anxious.       Objective:    Physical Exam  Constitutional: She is oriented to person, place, and time. She appears well-developed and well-nourished.  Cardiovascular: Normal rate, regular rhythm and normal heart sounds.  Pulmonary/Chest: Effort normal and breath  sounds normal.  Neurological: She is alert and oriented to person, place, and time.    BP 130/79 (BP Location: Left Arm, Patient Position: Sitting, Cuff Size: Normal)   Pulse 65   Temp 97.7 F (36.5 C) (Oral)   Ht 5\' 3"  (1.6 m)   Wt 130 lb 9.6 oz (59.2 kg)   SpO2 98%   BMI 23.13 kg/m  Wt Readings from Last 3 Encounters:  01/05/20 130 lb 9.6 oz (59.2 kg)  12/03/19 132 lb 12.8 oz (60.2 kg)  06/10/19 123 lb (55.8 kg)     Health Maintenance Due  Topic Date Due  . PNA vac Low Risk Adult (1 of 2 - PCV13) 10/10/2008    Lab Results  Component Value Date   TSH 5.64 07/11/2018   Lab Results  Component Value Date   WBC 4.6 07/11/2018   HGB 10.4 (L) 10/31/2017   HCT 38 07/11/2018   MCV 94.3 07/11/2018   PLT 225 10/31/2017   Lab Results  Component Value Date   NA 140 07/11/2018   K 4.6 07/11/2018   CO2 31 07/11/2018   GLUCOSE 129 (H) 10/31/2017   BUN 15 07/11/2018   CREATININE 1.07 07/11/2018   BILITOT 0.3 07/11/2018   ALKPHOS 76 07/11/2018   AST 18 07/11/2018   ALT 9 07/11/2018   PROT 6.1 (A) 07/11/2018   ALBUMIN 3.9 07/11/2018   CALCIUM 8.9 07/11/2018   ANIONGAP 8 10/31/2017   Lab Results  Component Value Date   CHOL 185 07/11/2018   Lab Results  Component Value Date   HDL 60 07/11/2018   Lab Results  Component Value Date   LDLCALC 104 07/11/2018   Lab Results  Component Value Date   TRIG 114 07/11/2018   Lab Results  Component Value Date   CHOLHDL  3.1 07/11/2018     Assessment & Plan:  1. Insomnia, unspecified type Amitriptyline -increase dose to 50mg  nightly. Continue to wean xanax. Continue melatonin, exercise, aroma therapy D/w pt can not write for additional xanax so must completely wean off use of medication to avoid withdrawal.  Follow-up:   1 month-may need to further increase amitriptyline Nichola Cieslinski Hannah Beat, MD

## 2020-01-06 DIAGNOSIS — H43813 Vitreous degeneration, bilateral: Secondary | ICD-10-CM | POA: Diagnosis not present

## 2020-01-06 DIAGNOSIS — H353211 Exudative age-related macular degeneration, right eye, with active choroidal neovascularization: Secondary | ICD-10-CM | POA: Diagnosis not present

## 2020-01-06 DIAGNOSIS — H353122 Nonexudative age-related macular degeneration, left eye, intermediate dry stage: Secondary | ICD-10-CM | POA: Diagnosis not present

## 2020-01-07 DIAGNOSIS — Z23 Encounter for immunization: Secondary | ICD-10-CM | POA: Diagnosis not present

## 2020-01-19 ENCOUNTER — Other Ambulatory Visit: Payer: Self-pay | Admitting: Family Medicine

## 2020-02-01 DIAGNOSIS — Z79899 Other long term (current) drug therapy: Secondary | ICD-10-CM | POA: Diagnosis not present

## 2020-02-01 DIAGNOSIS — M545 Low back pain: Secondary | ICD-10-CM | POA: Diagnosis not present

## 2020-02-01 DIAGNOSIS — G894 Chronic pain syndrome: Secondary | ICD-10-CM | POA: Diagnosis not present

## 2020-02-02 ENCOUNTER — Ambulatory Visit: Payer: Medicare Other | Admitting: Family Medicine

## 2020-02-05 DIAGNOSIS — Z23 Encounter for immunization: Secondary | ICD-10-CM | POA: Diagnosis not present

## 2020-02-10 DIAGNOSIS — H353122 Nonexudative age-related macular degeneration, left eye, intermediate dry stage: Secondary | ICD-10-CM | POA: Diagnosis not present

## 2020-02-10 DIAGNOSIS — H43813 Vitreous degeneration, bilateral: Secondary | ICD-10-CM | POA: Diagnosis not present

## 2020-02-10 DIAGNOSIS — H353212 Exudative age-related macular degeneration, right eye, with inactive choroidal neovascularization: Secondary | ICD-10-CM | POA: Diagnosis not present

## 2020-02-29 DIAGNOSIS — Z79899 Other long term (current) drug therapy: Secondary | ICD-10-CM | POA: Diagnosis not present

## 2020-02-29 DIAGNOSIS — G894 Chronic pain syndrome: Secondary | ICD-10-CM | POA: Diagnosis not present

## 2020-02-29 DIAGNOSIS — M545 Low back pain: Secondary | ICD-10-CM | POA: Diagnosis not present

## 2020-03-10 ENCOUNTER — Ambulatory Visit: Payer: Medicare Other | Admitting: Family Medicine

## 2020-03-28 DIAGNOSIS — M545 Low back pain: Secondary | ICD-10-CM | POA: Diagnosis not present

## 2020-03-28 DIAGNOSIS — G894 Chronic pain syndrome: Secondary | ICD-10-CM | POA: Diagnosis not present

## 2020-03-28 DIAGNOSIS — Z79899 Other long term (current) drug therapy: Secondary | ICD-10-CM | POA: Diagnosis not present

## 2020-04-13 DIAGNOSIS — H353123 Nonexudative age-related macular degeneration, left eye, advanced atrophic without subfoveal involvement: Secondary | ICD-10-CM | POA: Diagnosis not present

## 2020-04-13 DIAGNOSIS — H31091 Other chorioretinal scars, right eye: Secondary | ICD-10-CM | POA: Diagnosis not present

## 2020-04-13 DIAGNOSIS — H353212 Exudative age-related macular degeneration, right eye, with inactive choroidal neovascularization: Secondary | ICD-10-CM | POA: Diagnosis not present

## 2020-04-13 DIAGNOSIS — H43813 Vitreous degeneration, bilateral: Secondary | ICD-10-CM | POA: Diagnosis not present

## 2020-04-26 DIAGNOSIS — M545 Low back pain: Secondary | ICD-10-CM | POA: Diagnosis not present

## 2020-04-26 DIAGNOSIS — G894 Chronic pain syndrome: Secondary | ICD-10-CM | POA: Diagnosis not present

## 2020-04-26 DIAGNOSIS — Z79899 Other long term (current) drug therapy: Secondary | ICD-10-CM | POA: Diagnosis not present

## 2020-05-27 DIAGNOSIS — M545 Low back pain: Secondary | ICD-10-CM | POA: Diagnosis not present

## 2020-05-27 DIAGNOSIS — Z79899 Other long term (current) drug therapy: Secondary | ICD-10-CM | POA: Diagnosis not present

## 2020-05-27 DIAGNOSIS — G894 Chronic pain syndrome: Secondary | ICD-10-CM | POA: Diagnosis not present

## 2020-06-06 DIAGNOSIS — J302 Other seasonal allergic rhinitis: Secondary | ICD-10-CM | POA: Diagnosis not present

## 2020-06-06 DIAGNOSIS — E039 Hypothyroidism, unspecified: Secondary | ICD-10-CM | POA: Diagnosis not present

## 2020-06-06 DIAGNOSIS — R0602 Shortness of breath: Secondary | ICD-10-CM | POA: Diagnosis not present

## 2020-06-06 DIAGNOSIS — F331 Major depressive disorder, recurrent, moderate: Secondary | ICD-10-CM | POA: Diagnosis not present

## 2020-06-06 DIAGNOSIS — Z0189 Encounter for other specified special examinations: Secondary | ICD-10-CM | POA: Diagnosis not present

## 2020-06-06 DIAGNOSIS — F419 Anxiety disorder, unspecified: Secondary | ICD-10-CM | POA: Diagnosis not present

## 2020-06-06 DIAGNOSIS — K219 Gastro-esophageal reflux disease without esophagitis: Secondary | ICD-10-CM | POA: Diagnosis not present

## 2020-06-06 DIAGNOSIS — M545 Low back pain: Secondary | ICD-10-CM | POA: Diagnosis not present

## 2020-06-16 ENCOUNTER — Ambulatory Visit (HOSPITAL_COMMUNITY)
Admission: RE | Admit: 2020-06-16 | Discharge: 2020-06-16 | Disposition: A | Discharge status: Home / Self Care | Payer: Medicare Other | Source: Ambulatory Visit | Attending: Adult Health Nurse Practitioner | Admitting: Adult Health Nurse Practitioner

## 2020-06-16 ENCOUNTER — Other Ambulatory Visit (HOSPITAL_COMMUNITY): Payer: Self-pay | Admitting: Adult Health Nurse Practitioner

## 2020-06-16 ENCOUNTER — Other Ambulatory Visit: Payer: Self-pay

## 2020-06-16 DIAGNOSIS — R0602 Shortness of breath: Secondary | ICD-10-CM

## 2020-06-20 DIAGNOSIS — E039 Hypothyroidism, unspecified: Secondary | ICD-10-CM | POA: Diagnosis not present

## 2020-06-20 DIAGNOSIS — Z1321 Encounter for screening for nutritional disorder: Secondary | ICD-10-CM | POA: Diagnosis not present

## 2020-06-20 DIAGNOSIS — F331 Major depressive disorder, recurrent, moderate: Secondary | ICD-10-CM | POA: Diagnosis not present

## 2020-06-20 DIAGNOSIS — R0602 Shortness of breath: Secondary | ICD-10-CM | POA: Diagnosis not present

## 2020-06-20 DIAGNOSIS — Z0189 Encounter for other specified special examinations: Secondary | ICD-10-CM | POA: Diagnosis not present

## 2020-06-20 DIAGNOSIS — M545 Low back pain: Secondary | ICD-10-CM | POA: Diagnosis not present

## 2020-06-20 DIAGNOSIS — F419 Anxiety disorder, unspecified: Secondary | ICD-10-CM | POA: Diagnosis not present

## 2020-06-20 DIAGNOSIS — K219 Gastro-esophageal reflux disease without esophagitis: Secondary | ICD-10-CM | POA: Diagnosis not present

## 2020-06-20 DIAGNOSIS — J302 Other seasonal allergic rhinitis: Secondary | ICD-10-CM | POA: Diagnosis not present

## 2020-06-24 DIAGNOSIS — E039 Hypothyroidism, unspecified: Secondary | ICD-10-CM | POA: Diagnosis not present

## 2020-06-24 DIAGNOSIS — J302 Other seasonal allergic rhinitis: Secondary | ICD-10-CM | POA: Diagnosis not present

## 2020-06-24 DIAGNOSIS — R944 Abnormal results of kidney function studies: Secondary | ICD-10-CM | POA: Diagnosis not present

## 2020-06-24 DIAGNOSIS — R0602 Shortness of breath: Secondary | ICD-10-CM | POA: Diagnosis not present

## 2020-06-24 DIAGNOSIS — E782 Mixed hyperlipidemia: Secondary | ICD-10-CM | POA: Diagnosis not present

## 2020-06-24 DIAGNOSIS — M545 Low back pain: Secondary | ICD-10-CM | POA: Diagnosis not present

## 2020-06-24 DIAGNOSIS — F331 Major depressive disorder, recurrent, moderate: Secondary | ICD-10-CM | POA: Diagnosis not present

## 2020-06-24 DIAGNOSIS — F419 Anxiety disorder, unspecified: Secondary | ICD-10-CM | POA: Diagnosis not present

## 2020-06-24 DIAGNOSIS — K219 Gastro-esophageal reflux disease without esophagitis: Secondary | ICD-10-CM | POA: Diagnosis not present

## 2020-06-27 ENCOUNTER — Other Ambulatory Visit: Payer: Self-pay | Admitting: Internal Medicine

## 2020-06-27 DIAGNOSIS — I2510 Atherosclerotic heart disease of native coronary artery without angina pectoris: Secondary | ICD-10-CM

## 2020-06-27 DIAGNOSIS — M545 Low back pain: Secondary | ICD-10-CM | POA: Diagnosis not present

## 2020-06-27 DIAGNOSIS — G894 Chronic pain syndrome: Secondary | ICD-10-CM | POA: Diagnosis not present

## 2020-06-27 DIAGNOSIS — Z79899 Other long term (current) drug therapy: Secondary | ICD-10-CM | POA: Diagnosis not present

## 2020-07-01 ENCOUNTER — Encounter (HOSPITAL_COMMUNITY): Payer: Self-pay

## 2020-07-01 ENCOUNTER — Ambulatory Visit (HOSPITAL_COMMUNITY): Payer: Medicare Other

## 2020-07-13 DIAGNOSIS — H353123 Nonexudative age-related macular degeneration, left eye, advanced atrophic without subfoveal involvement: Secondary | ICD-10-CM | POA: Diagnosis not present

## 2020-07-13 DIAGNOSIS — H353212 Exudative age-related macular degeneration, right eye, with inactive choroidal neovascularization: Secondary | ICD-10-CM | POA: Diagnosis not present

## 2020-07-27 ENCOUNTER — Encounter: Payer: Self-pay | Admitting: Cardiology

## 2020-07-27 NOTE — Progress Notes (Signed)
Cardiology Office Note  Date: 07/28/2020   ID: Brittany, Yang Jun 19, 1941, MRN [id number]  PCP:  Celene Squibb, MD  Cardiologist:  Rozann Lesches, MD Electrophysiologist:  None   Chief Complaint  Patient presents with  . Shortness of Breath    History of Present Illness: [name] is a 79 y.o. female referred for cardiology consultation by Dr. Nevada Crane for evaluation of shortness of breath and also reported to carotid artery disease based on previous outpatient screening.  She states that over the last 1 to 2 years she has been experiencing increasing dyspnea on exertion.  No frank chest discomfort but feels short of breath at times to the point she needs to stop and rest, this has been getting more prominent particularly in the last several months.  No associated palpitations or syncope.  She tells me that she had a [facility] screening about 2 years ago and was told that she had some degree of carotid artery atherosclerosis.  This has not been further investigated.  She is currently not on aspirin or statin.  I reviewed her recent lab work from July as noted below.  LDL was 129.  I personally reviewed her ECG today which shows sinus rhythm with PACs.  She reports no personal history of CAD, has not undergone any prior cardiac structural or ischemic testing.  Past Medical History:  Diagnosis Date  . Allergy   . Anxiety   . Arthritis   . Cataract   . Chronic back pain   . Chronic kidney disease   . Chronic sinusitis   . Depression   . Fibromyalgia   . GERD (gastroesophageal reflux disease)   . Glaucoma   . Hypothyroidism   . Macular degeneration   . PONV (postoperative nausea and vomiting)   . Skin cancer     Past Surgical History:  Procedure Laterality Date  . ABDOMINAL HYSTERECTOMY     1985  . ABDOMINAL HYSTERECTOMY    . APPENDECTOMY    . back stimulator removed  11/15/2016  . back stimulatory    . BACK SURGERY     plate and screws  . BACK SURGERY      plate in back  . CATARACT EXTRACTION W/PHACO Right 03/22/2016   Procedure: CATARACT EXTRACTION PHACO AND INTRAOCULAR LENS PLACEMENT (IOC);  Surgeon: Tonny Branch, MD;  Location: [facility];  Service: Ophthalmology;  Laterality: Right;  CDE 5.69  . CATARACT EXTRACTION W/PHACO Left 04/19/2016   Procedure: CATARACT EXTRACTION PHACO AND INTRAOCULAR LENS PLACEMENT (IOC);  Surgeon: Tonny Branch, MD;  Location: [facility];  Service: Ophthalmology;  Laterality: Left;  CDE 5.05  . CHOLECYSTECTOMY     2014, non-functioning GB  . COLONOSCOPY N/A 11/11/2015   Procedure: COLONOSCOPY;  Surgeon: Rogene Houston, MD;  Location: [facility];  Service: Endoscopy;  Laterality: N/A;  1:30  . COSMETIC SURGERY    . ESOPHAGOGASTRODUODENOSCOPY N/A 12/19/2016   Procedure: ESOPHAGOGASTRODUODENOSCOPY (EGD);  Surgeon: Rogene Houston, MD;  Location: [facility];  Service: Endoscopy;  Laterality: N/A;  2:25  . EYE SURGERY    . LAPAROSCOPIC APPENDECTOMY N/A 10/30/2017   Procedure: APPENDECTOMY LAPAROSCOPIC;  Surgeon: Jovita Kussmaul, MD;  Location: [facility];  Service: General;  Laterality: N/A;  . LAPAROSCOPIC REMOVAL OF MESENTERIC MASS N/A 10/30/2017   Procedure: LAPAROSCOPIC RESECTION OF MASS SMALL BOWEL MESENTRY;  Surgeon: Jovita Kussmaul, MD;  Location: [facility];  Service: General;  Laterality: N/A;  . LAPAROSCOPIC SMALL BOWEL RESECTION  N/A 10/30/2017   Procedure: LAPAROSCOPIC ASSISTED SMALL BOWEL RESECTION;  Surgeon: Jovita Kussmaul, MD;  Location: [facility];  Service: General;  Laterality: N/A;  . SMALL INTESTINE SURGERY    . TONSILLECTOMY      Current Outpatient Medications  Medication Sig Dispense Refill  . amitriptyline (ELAVIL) 50 MG tablet Take 1 tablet (50 mg total) by mouth at bedtime. 30 tablet 2  . Azelastine-Fluticasone (DYMISTA) 137-50 MCG/ACT SUSP Place 2 sprays into both nostrils daily as needed (for allergies).    Marland Kitchen estradiol (CLIMARA - DOSED IN MG/24 HR) 0.025 mg/24hr patch Place 0.025 mg onto the skin every Monday.   6  .  famotidine (PEPCID) 10 MG tablet Take 10 mg by mouth 2 (two) times daily.    Marland Kitchen FLUoxetine (PROZAC) 20 MG tablet Take 20 mg by mouth daily.    Marland Kitchen HYDROcodone-acetaminophen (NORCO/VICODIN) 5-325 MG per tablet Take 1 tablet 3 (three) times daily as needed by mouth for moderate pain.     Marland Kitchen levothyroxine (SYNTHROID) 75 MCG tablet Take 1 tablet (75 mcg total) by mouth daily. 90 tablet 3  . OVER THE COUNTER MEDICATION Take 2 capsules daily by mouth. Premiere Formula Ocular Nutrition    . OVER THE COUNTER MEDICATION Take 1 capsule daily by mouth. Probiotic Chime Supplement     No current facility-administered medications for this visit.   Allergies:  Doxycycline   Social History: The patient  reports that she has never smoked. She has never used smokeless tobacco. She reports current alcohol use. She reports that she does not use drugs.   Family History: The patient's family history includes Allergies in her daughter and mother; Heart disease in her mother; Hodgkin's lymphoma in her father; Hyperlipidemia in her mother; Liver cancer in her sister.   ROS:   No orthopnea or PND.  Physical Exam: VS:  BP 138/70   Pulse 74   Ht 5' 3.5" (1.613 m)   Wt 131 lb (59.4 kg)   SpO2 95%   BMI 22.84 kg/m , BMI Body mass index is 22.84 kg/m.  Wt Readings from Last 3 Encounters:  07/28/20 131 lb (59.4 kg)  01/05/20 130 lb 9.6 oz (59.2 kg)  12/03/19 132 lb 12.8 oz (60.2 kg)    General: Patient appears comfortable at rest. HEENT: Conjunctiva and lids normal, wearing a mask. Neck: Supple, no elevated JVP or definite carotid bruits, no thyromegaly. Lungs: Clear to auscultation, nonlabored breathing at rest. Cardiac: Regular rate and rhythm with ectopy, no S3 or significant systolic murmur, no pericardial rub. Abdomen: Soft, bowel sounds present. Extremities: No pitting edema, distal pulses 2+. Skin: Warm and dry. Musculoskeletal: No kyphosis. Neuropsychiatric: Alert and oriented x3, affect grossly  appropriate.  ECG:  An ECG dated 08/14/2017 was personally reviewed today and demonstrated:  Sinus rhythm.  Recent Labwork:    Component Value Date/Time   CHOL 185 07/11/2018 1005   TRIG 114 07/11/2018 1005   HDL 60 07/11/2018 1005   CHOLHDL 3.1 07/11/2018 1005   LDLCALC 104 07/11/2018 1005  July 2021: Hemoglobin 12.5, platelets 285, BUN 20, creatinine 1.08, potassium 5.2, AST 18, ALT 12, cholesterol 221, triglycerides 189, HDL 59, LDL 129, TSH 2.35  Other Studies Reviewed Today:  FINDINGS: Heart and mediastinal contours are within normal limits. No focal opacities or effusions. No acute bony abnormality.  IMPRESSION: No active cardiopulmonary disease.  Assessment and Plan:  1.  Dyspnea on exertion, progressive over the last 1 to 2 years.  ECG is nonspecific with  borderline increased voltage and PACs.  Main cardiac risk factor is history of possible carotid artery atherosclerosis and also hyperlipidemia with recent LDL 129.  She has not undergone any previous cardiac structural or ischemic evaluation.  We will arrange an echocardiogram as well as a Lexiscan Myoview for further investigation.  2.  Reported history of carotid artery atherosclerosis based on previous screening about 2 years ago.  We will obtain formal carotid Dopplers.  3.  Hyperlipidemia based on lab work, recent total cholesterol 221 and LDL 129.  Depending on above testing results, may need further medical therapy.  Medication Adjustments/Labs and Tests Ordered: Current medicines are reviewed at length with the patient today.  Concerns regarding medicines are outlined above.   Tests Ordered: Orders Placed This Encounter  Procedures  . NM Myocar Multi W/Spect W/Wall Motion / EF  . EKG 12-Lead  . ECHOCARDIOGRAM COMPLETE  . VAS US CAROTID    Medication Changes: No orders of the defined types were placed in this encounter.   Disposition:  Follow up test results and determine next step.  Signed, Satira Sark, MD, [facility] 07/28/2020 9:31 AM    [place] at [place], [place], [place] [address] Phone: [phone]; Fax: [phone]

## 2020-07-28 ENCOUNTER — Encounter: Payer: Self-pay | Admitting: *Deleted

## 2020-07-28 ENCOUNTER — Ambulatory Visit (INDEPENDENT_AMBULATORY_CARE_PROVIDER_SITE_OTHER): Payer: Medicare Other | Admitting: Cardiology

## 2020-07-28 ENCOUNTER — Telehealth: Payer: Self-pay | Admitting: Cardiology

## 2020-07-28 ENCOUNTER — Encounter: Payer: Self-pay | Admitting: Cardiology

## 2020-07-28 ENCOUNTER — Other Ambulatory Visit: Payer: Self-pay

## 2020-07-28 VITALS — BP 138/70 | HR 74 | Ht 63.5 in | Wt 131.0 lb

## 2020-07-28 DIAGNOSIS — R9431 Abnormal electrocardiogram [ECG] [EKG]: Secondary | ICD-10-CM | POA: Diagnosis not present

## 2020-07-28 DIAGNOSIS — I2510 Atherosclerotic heart disease of native coronary artery without angina pectoris: Secondary | ICD-10-CM | POA: Diagnosis not present

## 2020-07-28 DIAGNOSIS — Z79899 Other long term (current) drug therapy: Secondary | ICD-10-CM | POA: Diagnosis not present

## 2020-07-28 DIAGNOSIS — M545 Low back pain: Secondary | ICD-10-CM | POA: Diagnosis not present

## 2020-07-28 DIAGNOSIS — I779 Disorder of arteries and arterioles, unspecified: Secondary | ICD-10-CM

## 2020-07-28 DIAGNOSIS — E782 Mixed hyperlipidemia: Secondary | ICD-10-CM | POA: Diagnosis not present

## 2020-07-28 DIAGNOSIS — G894 Chronic pain syndrome: Secondary | ICD-10-CM | POA: Diagnosis not present

## 2020-07-28 DIAGNOSIS — R0602 Shortness of breath: Secondary | ICD-10-CM | POA: Diagnosis not present

## 2020-07-28 NOTE — Patient Instructions (Addendum)
Medication Instructions:   Your physician recommends that you continue on your current medications as directed. Please refer to the Current Medication list given to you today.  Labwork:  None  Testing/Procedures: Your physician has requested that you have a carotid duplex. This test is an ultrasound of the carotid arteries in your neck. It looks at blood flow through these arteries that supply the brain with blood. Allow one hour for this exam. There are no restrictions or special instructions. Your physician has requested that you have an echocardiogram. Echocardiography is a painless test that uses sound waves to create images of your heart. It provides your doctor with information about the size and shape of your heart and how well your heart's chambers and valves are working. This procedure takes approximately one hour. There are no restrictions for this procedure. Your physician has requested that you have a lexiscan myoview. For further information please visit [email or web address]. Please follow instruction sheet, as given.  Follow-Up:  Your physician recommends that you schedule a follow-up appointment in: pending.  Any Other Special Instructions Will Be Listed Below (If Applicable).  If you need a refill on your cardiac medications before your next appointment, please call your pharmacy.

## 2020-07-28 NOTE — Telephone Encounter (Signed)
Pre-cert Verification for the following procedure    LEXISCAN MYOVIEW    DATE:  08/02/2020  LOCATION: [facility]  Patient is also scheduled for Echo/Cartoid Dopplers 08/04/2020 [place] [facility]

## 2020-08-02 ENCOUNTER — Encounter (HOSPITAL_COMMUNITY)
Admission: RE | Admit: 2020-08-02 | Discharge: 2020-08-02 | Disposition: A | Discharge status: Home / Self Care | Payer: Medicare Other | Source: Ambulatory Visit | Attending: Cardiology | Admitting: Cardiology

## 2020-08-02 ENCOUNTER — Encounter (HOSPITAL_BASED_OUTPATIENT_CLINIC_OR_DEPARTMENT_OTHER)
Admission: RE | Admit: 2020-08-02 | Discharge: 2020-08-02 | Disposition: A | Discharge status: Home / Self Care | Payer: Medicare Other | Source: Ambulatory Visit | Attending: Cardiology | Admitting: Cardiology

## 2020-08-02 ENCOUNTER — Other Ambulatory Visit: Payer: Self-pay

## 2020-08-02 ENCOUNTER — Encounter (HOSPITAL_COMMUNITY): Payer: Self-pay

## 2020-08-02 DIAGNOSIS — R9431 Abnormal electrocardiogram [ECG] [EKG]: Secondary | ICD-10-CM | POA: Diagnosis not present

## 2020-08-02 DIAGNOSIS — R0602 Shortness of breath: Secondary | ICD-10-CM

## 2020-08-02 HISTORY — DX: Disorder of kidney and ureter, unspecified: N28.9

## 2020-08-02 LAB — NM MYOCAR MULTI W/SPECT W/WALL MOTION / EF
LV dias vol: 29 mL (ref 46–106)
LV sys vol: 21 mL
Peak HR: 99 {beats}/min
RATE: 0.48
Rest HR: 67 {beats}/min
SDS: 2
SRS: 0
SSS: 2
TID: 0.97

## 2020-08-02 MED ORDER — SODIUM CHLORIDE FLUSH 0.9 % IV SOLN
INTRAVENOUS | Status: AC
  Administered 2020-08-02: 10 mL via INTRAVENOUS
  Filled 2020-08-02: qty 10

## 2020-08-02 MED ORDER — TECHNETIUM TC 99M TETROFOSMIN IV KIT
30.0000 | PACK | Freq: Once | INTRAVENOUS | Status: AC | PRN
  Administered 2020-08-02: 31 via INTRAVENOUS

## 2020-08-02 MED ORDER — REGADENOSON 0.4 MG/5ML IV SOLN
INTRAVENOUS | Status: AC
  Administered 2020-08-02: 0.4 mg via INTRAVENOUS
  Filled 2020-08-02: qty 5

## 2020-08-02 MED ORDER — TECHNETIUM TC 99M TETROFOSMIN IV KIT
10.0000 | PACK | Freq: Once | INTRAVENOUS | Status: AC | PRN
  Administered 2020-08-02: 11 via INTRAVENOUS

## 2020-08-04 ENCOUNTER — Telehealth: Payer: Self-pay | Admitting: Cardiology

## 2020-08-04 ENCOUNTER — Ambulatory Visit (INDEPENDENT_AMBULATORY_CARE_PROVIDER_SITE_OTHER): Payer: Medicare Other

## 2020-08-04 ENCOUNTER — Other Ambulatory Visit: Payer: Self-pay

## 2020-08-04 DIAGNOSIS — I779 Disorder of arteries and arterioles, unspecified: Secondary | ICD-10-CM | POA: Diagnosis not present

## 2020-08-04 DIAGNOSIS — R0602 Shortness of breath: Secondary | ICD-10-CM

## 2020-08-04 LAB — ECHOCARDIOGRAM COMPLETE
Area-P 1/2: 2.87 cm2
Calc EF: 57.1 %
S' Lateral: 2.15 cm
Single Plane A2C EF: 69.4 %
Single Plane A4C EF: 41.8 %

## 2020-08-04 NOTE — Telephone Encounter (Signed)
-----   Message from Satira Sark, MD sent at 08/02/2020  3:08 PM EDT ----- Results reviewed.  Please let her know that the stress test showed overall normal perfusion which argues against obstructive CAD being present.  The calculated ejection fraction was however significantly reduced, although echocardiography would be a better way to assess this and determine if there is actually an abnormality in cardiac function or not.  Await pending echocardiogram scheduled for this week.

## 2020-08-04 NOTE — Telephone Encounter (Signed)
Patient informed. Copy sent to PCP Advised that we are awaiting echo result.

## 2020-08-04 NOTE — Telephone Encounter (Signed)
New message    Patient daughter read stress test report and would like to discuss having cath.   (daughter is out her in the lobby while her mom is getting echo and carotid

## 2020-08-05 ENCOUNTER — Telehealth: Payer: Self-pay | Admitting: Cardiology

## 2020-08-05 NOTE — Telephone Encounter (Signed)
Daughter call for Pt test resluts    Please call [name] [phone]   thanks

## 2020-08-05 NOTE — Telephone Encounter (Signed)
Pt voiced understanding - routed both test results to pcp    Results reviewed. Reassuring carotid studies with only a minimal degree of wall thickening versus plaque in the internal carotid arteries. Would continue to focus on risk factor modification with PCP.    Results reviewed. Please let her know that the echocardiogram shows she has normal cardiac function with LVEF 60 to 65%. This was significantly underestimated with the recent Myoview study and this assessment is much more reassuring. In the absence of perfusion defects by Myoview to suggest CAD, and now documentation of normal LVEF, no clear cardiac etiology to explain her shortness of breath. Would continue to search for other causes with PCP. I will follow up on the carotid studies when they have been finalized.

## 2020-08-09 ENCOUNTER — Telehealth: Payer: Self-pay | Admitting: Cardiology

## 2020-08-09 NOTE — Telephone Encounter (Signed)
Spoke with patient - SOB - about the same, no worse & no better.  States her daughter [name]) is a [employer] scientist & was questioning her having some other type of test.   Call placed to daughter [name]) -  lmtcb

## 2020-08-09 NOTE — Telephone Encounter (Signed)
[name] -daughter called stating that mother continues to have shortness of breath with dizzness.  [phone]

## 2020-08-29 DIAGNOSIS — G894 Chronic pain syndrome: Secondary | ICD-10-CM | POA: Diagnosis not present

## 2020-08-29 DIAGNOSIS — Z79899 Other long term (current) drug therapy: Secondary | ICD-10-CM | POA: Diagnosis not present

## 2020-08-29 DIAGNOSIS — M545 Low back pain, unspecified: Secondary | ICD-10-CM | POA: Diagnosis not present

## 2020-09-05 ENCOUNTER — Ambulatory Visit: Payer: Medicare Other | Admitting: Cardiology

## 2020-09-05 ENCOUNTER — Other Ambulatory Visit: Payer: Self-pay

## 2020-09-05 ENCOUNTER — Encounter: Payer: Self-pay | Admitting: Pulmonary Disease

## 2020-09-05 ENCOUNTER — Ambulatory Visit (INDEPENDENT_AMBULATORY_CARE_PROVIDER_SITE_OTHER): Payer: Medicare Other | Admitting: Pulmonary Disease

## 2020-09-05 ENCOUNTER — Ambulatory Visit (HOSPITAL_COMMUNITY)
Admission: RE | Admit: 2020-09-05 | Discharge: 2020-09-05 | Disposition: A | Discharge status: Home / Self Care | Payer: Medicare Other | Source: Ambulatory Visit | Attending: Pulmonary Disease | Admitting: Pulmonary Disease

## 2020-09-05 VITALS — BP 116/80 | HR 80 | Temp 97.9°F | Ht 63.0 in | Wt 130.6 lb

## 2020-09-05 DIAGNOSIS — R0602 Shortness of breath: Secondary | ICD-10-CM | POA: Diagnosis not present

## 2020-09-05 DIAGNOSIS — J811 Chronic pulmonary edema: Secondary | ICD-10-CM | POA: Diagnosis not present

## 2020-09-05 DIAGNOSIS — I509 Heart failure, unspecified: Secondary | ICD-10-CM | POA: Diagnosis not present

## 2020-09-05 DIAGNOSIS — I2510 Atherosclerotic heart disease of native coronary artery without angina pectoris: Secondary | ICD-10-CM | POA: Diagnosis not present

## 2020-09-05 NOTE — Progress Notes (Signed)
[facility]  Chief Complaint  Patient presents with   Consult    shortness of breath with activity for about 2 years and wosened this past year    Constitutional:  BP 116/80 (BP Location: Left Arm, Cuff Size: Normal)    Pulse 80    Temp 97.9 F (36.6 C) (Other (Comment)) Comment (Src): wrist   Ht 5' 3"  (1.6 m)    Wt 130 lb 9.6 oz (59.2 kg)    SpO2 100% Comment: Room air   BMI 23.13 kg/m   Past Medical History:  Allergies, Anxiety, OA, Cataract, Back pain, CKD, Depression, Fibromyalgia, GERD, Glaucoma, Hypothyroidism, Macular degeneration  Past Surgical History:  Her  has a past surgical history that includes Abdominal hysterectomy; back stimulatory; Tonsillectomy; Cholecystectomy; Colonoscopy (N/A, 11/11/2015); Back surgery; Cataract extraction w/PHACO (Right, 03/22/2016); Cataract extraction w/PHACO (Left, 04/19/2016); Back surgery; Abdominal hysterectomy; back stimulator removed (11/15/2016); Esophagogastroduodenoscopy (N/A, 12/19/2016); Laparoscopic removal of mesenteric mass (N/A, 10/30/2017); Laparoscopic small bowel resection (N/A, 10/30/2017); laparoscopic appendectomy (N/A, 10/30/2017); Appendectomy; Cosmetic surgery; Eye surgery; and Small intestine surgery.  Brief Summary:  [name] is a 79 y.o. female with shortness of breath.      Subjective:   She has noticed trouble with her breathing for the past two years.  She says that when she is seated and then stands up she feels a cool wave come over her body, and then feels short of breath.  She has to stop for a few minutes, and then she recovers.  She then can go about her activities.  She is not having cough, wheeze, or sputum.  No prior history of asthma.  She was seen by Dr. Melvyn Novas for a cough in 2016 and this resolved.  She has allergies, but seems controlled at present.  Not having wheeze, chest tightness, or sputum.  She had recent cardiac assessment.  Nuclear stress test didn't  show ischemia, but there was concern for low ejection fraction.  Echo however was normal.  No history of thromboembolic disease or pneumonia.  She is followed by pain management specialist and was told that she has stage 2 CKD and lupus.  Hasn't been seen by a nephrologist or rheumatologist recently.  She was seen by Dr. Charlestine Night with rheumatology years ago, and was told she has osteoarthritis.  No history of smoking.  No family history of lung disease.  She worked at [employer] and retired from there in 2003.  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  Physical Exam:   Appearance - well kempt   ENMT - no sinus tenderness, no oral exudate, no LAN, Mallampati 2 airway, no stridor  Respiratory - equal breath sounds bilaterally, no wheezing or rales  CV - s1s2 regular rate and rhythm, no murmurs  Ext - no clubbing, no edema  Skin - no rashes  Psych - normal mood and affect   Pulmonary testing:   PFT 12/08/14 >> 1.83 (82%), FEV1% 89, TLC 4.47 (87%), DLCO 63%  Chest Imaging:    Sleep Tests:    Cardiac Tests:   Nuclear stress test 08/02/20 >>no ischemia  Echo 08/04/20 >> EF 60 to 65%, RVSP 25.7 mmHg, mild AR   Social History:  She  reports that she has never smoked. She has never used smokeless tobacco. She reports current alcohol use. She reports that she does not use drugs.  Family History:  Her family history includes Allergies in her daughter and mother; Heart  disease in her mother; Hodgkin's lymphoma in her father; Hyperlipidemia in her mother; Liver cancer in her sister.    Discussion:  She has symptoms of dyspnea and feeling flush for the past 1 to 2 years.  These episodes seem to be associated with positional changes.  She reports being recently diagnosed with stage 2 CKD and lupus.    Assessment/Plan:   Dyspnea. - will arrange for pulmonary function test, chest xray, 6 minute walk test, CBC with diff, CMET, ESR, ANA, and RF - might need f/u with cardiology to  assess whether she could have autonomic dysfunction (such as POTS) that could be causing her symptoms with positional changes  Time Spent Involved in Patient Care on Day of Examination:  34 minutes  Follow up:  Patient Instructions  Will arrange for chest xray, pulmonary function test, 6 minute walk test, and lab work  Follow up in 4 weeks   Medication List:   Allergies as of 09/05/2020      Reactions   Doxycycline Rash   Rash on hands      Medication List       Accurate as of September 05, 2020 10:47 AM. If you have any questions, ask your nurse or doctor.        amitriptyline 50 MG tablet Commonly known as: ELAVIL Take 1 tablet (50 mg total) by mouth at bedtime.   cetirizine 10 MG tablet Commonly known as: ZYRTEC Take 1 tablet by mouth at bedtime.   Dymista 137-50 MCG/ACT Susp Generic drug: Azelastine-Fluticasone Place 2 sprays into both nostrils daily as needed (for allergies).   estradiol 0.025 mg/24hr patch Commonly known as: CLIMARA - Dosed in mg/24 hr Place 0.025 mg onto the skin every Monday.   famotidine 20 MG tablet Commonly known as: PEPCID Take 20 mg by mouth 2 (two) times daily. What changed: Another medication with the same name was removed. Continue taking this medication, and follow the directions you see here. Changed by: Chesley Mires, MD   FLUoxetine 20 MG tablet Commonly known as: PROZAC Take 20 mg by mouth daily.   HYDROcodone-acetaminophen 5-325 MG tablet Commonly known as: NORCO/VICODIN Take 1 tablet 3 (three) times daily as needed by mouth for moderate pain.   levothyroxine 75 MCG tablet Commonly known as: SYNTHROID Take 1 tablet (75 mcg total) by mouth daily.   OVER THE COUNTER MEDICATION Take 2 capsules daily by mouth. Premiere Formula Ocular Nutrition   OVER THE COUNTER MEDICATION Take 1 capsule daily by mouth. Probiotic Chime Supplement       Signature:  Chesley Mires, MD [place] Pager - [phone] 09/05/2020, 10:47 AM

## 2020-09-05 NOTE — Patient Instructions (Signed)
Will arrange for chest xray, pulmonary function test, 6 minute walk test, and lab work  Follow up in 4 weeks

## 2020-09-07 ENCOUNTER — Other Ambulatory Visit (HOSPITAL_COMMUNITY)
Admission: RE | Admit: 2020-09-07 | Discharge: 2020-09-07 | Disposition: A | Discharge status: Home / Self Care | Payer: Medicare Other | Source: Ambulatory Visit | Attending: Pulmonary Disease | Admitting: Pulmonary Disease

## 2020-09-07 ENCOUNTER — Telehealth: Payer: Self-pay | Admitting: Pulmonary Disease

## 2020-09-07 DIAGNOSIS — R0602 Shortness of breath: Secondary | ICD-10-CM | POA: Diagnosis not present

## 2020-09-07 LAB — COMPREHENSIVE METABOLIC PANEL
ALT: 12 U/L (ref 0–44)
AST: 17 U/L (ref 15–41)
Albumin: 3.4 g/dL — ABNORMAL LOW (ref 3.5–5.0)
Alkaline Phosphatase: 62 U/L (ref 38–126)
Anion gap: 7 (ref 5–15)
BUN: 24 mg/dL — ABNORMAL HIGH (ref 8–23)
CO2: 27 mmol/L (ref 22–32)
Calcium: 8.9 mg/dL (ref 8.9–10.3)
Chloride: 101 mmol/L (ref 98–111)
Creatinine, Ser: 1.11 mg/dL — ABNORMAL HIGH (ref 0.44–1.00)
GFR, Estimated: 48 mL/min — ABNORMAL LOW (ref >60)
Glucose, Bld: 104 mg/dL — ABNORMAL HIGH (ref 70–99)
Potassium: 5.1 mmol/L (ref 3.5–5.1)
Sodium: 135 mmol/L (ref 135–145)
Total Bilirubin: 0.6 mg/dL (ref 0.3–1.2)
Total Protein: 6.6 g/dL (ref 6.5–8.1)

## 2020-09-07 LAB — CBC WITH DIFFERENTIAL/PLATELET
Abs Immature Granulocytes: 0.03 10*3/uL (ref 0.00–0.07)
Basophils Absolute: 0 10*3/uL (ref 0.0–0.1)
Basophils Relative: 0 %
Eosinophils Absolute: 0.1 10*3/uL (ref 0.0–0.5)
Eosinophils Relative: 2 %
HCT: 37.1 % (ref 36.0–46.0)
Hemoglobin: 11.9 g/dL — ABNORMAL LOW (ref 12.0–15.0)
Immature Granulocytes: 0 %
Lymphocytes Relative: 10 %
Lymphs Abs: 0.8 10*3/uL (ref 0.7–4.0)
MCH: 32.2 pg (ref 26.0–34.0)
MCHC: 32.1 g/dL (ref 30.0–36.0)
MCV: 100.3 fL — ABNORMAL HIGH (ref 80.0–100.0)
Monocytes Absolute: 0.8 10*3/uL (ref 0.1–1.0)
Monocytes Relative: 11 %
Neutro Abs: 5.6 10*3/uL (ref 1.7–7.7)
Neutrophils Relative %: 77 %
Platelets: 288 10*3/uL (ref 150–400)
RBC: 3.7 MIL/uL — ABNORMAL LOW (ref 3.87–5.11)
RDW: 12 % (ref 11.5–15.5)
WBC: 7.4 10*3/uL (ref 4.0–10.5)
nRBC: 0 % (ref 0.0–0.2)

## 2020-09-07 LAB — SEDIMENTATION RATE: Sed Rate: 64 mm/hr — ABNORMAL HIGH (ref 0–22)

## 2020-09-07 NOTE — Telephone Encounter (Signed)
Called and spoke with patient to let her know that labs are sent to [facility] lab not [facility]. Patient stated that she was currently at [facility]. Advised her that orders were placed the other day and they should be able to see them. She expressed understanding. Nothing further needed at this time.

## 2020-09-08 LAB — RHEUMATOID FACTOR: Rheumatoid fact SerPl-aCnc: 10.5 IU/mL (ref 0.0–13.9)

## 2020-09-08 LAB — ANA W/REFLEX IF POSITIVE: Anti Nuclear Antibody (ANA): NEGATIVE

## 2020-09-12 ENCOUNTER — Telehealth: Payer: Self-pay | Admitting: Pulmonary Disease

## 2020-09-12 NOTE — Telephone Encounter (Signed)
CLINICAL DATA:  Dyspnea, shortness of breath X 2 years worsening recently. No known history of COVID. Does have a history of congestive heart failure. Nonsmoker.  EXAM: CHEST - 2 VIEW  COMPARISON:  Chest x-ray 06/16/2020, CT chest 12/15/2014.  FINDINGS: The heart size and mediastinal contours are within normal limits.  Aortic arch calcifications.  Biapical pleural/pulmonary scarring. No focal consolidation. No pulmonary edema. No pleural effusion. No pneumothorax.  Diffusely decreased bone density. No acute osseous abnormality.  Surgical clips and possible surgical hardware of the spine overlie the partially visualized abdomen.  IMPRESSION: No active cardiopulmonary disease.  Electronically Signed   By: Iven Finn M.D.   On: 09/05/2020 21:23   CBC    Component Value Date/Time   WBC 7.4 09/07/2020 1249   RBC 3.70 (L) 09/07/2020 1249   HGB 11.9 (L) 09/07/2020 1249   HCT 37.1 09/07/2020 1249   HCT 38 07/11/2018 1005   PLT 288 09/07/2020 1249   MCV 100.3 (H) 09/07/2020 1249   MCV 94.3 07/11/2018 1005   MCH 32.2 09/07/2020 1249   MCHC 32.1 09/07/2020 1249   RDW 12.0 09/07/2020 1249   RDW 12.0 07/11/2018 1005   LYMPHSABS 0.8 09/07/2020 1249   MONOABS 0.8 09/07/2020 1249   EOSABS 0.1 09/07/2020 1249   EOSABS 147 07/11/2018 1005   BASOSABS 0.0 09/07/2020 1249    CMP Latest Ref Rng & Units 09/07/2020 07/11/2018 10/31/2017  Glucose 70 - 99 mg/dL 104(H) - 129(H)  BUN 8 - 23 mg/dL 24(H) 15 11  Creatinine 0.44 - 1.00 mg/dL 1.11(H) 1.07 1.06(H)  Sodium 135 - 145 mmol/L 135 140 138  Potassium 3.5 - 5.1 mmol/L 5.1 4.6 4.6  Chloride 98 - 111 mmol/L 101 104 105  CO2 22 - 32 mmol/L 27 31 25   Calcium 8.9 - 10.3 mg/dL 8.9 8.9 8.0(L)  Total Protein 6.5 - 8.1 g/dL 6.6 6.1(A) -  Total Bilirubin 0.3 - 1.2 mg/dL 0.6 0.3 -  Alkaline Phos 38 - 126 U/L 62 76 -  AST 15 - 41 U/L 17 18 -  ALT 0 - 44 U/L 12 9 -    Lab Results  Component Value Date   ANA Negative 09/07/2020     Lab Results  Component Value Date   RF 10.5 09/07/2020    Lab Results  Component Value Date   ESRSEDRATE 64 (H) 09/07/2020     Please let her know that her chest xray shows mild chronic scarring in the upper parts of her lungs which are just a marker of prior respiratory irritation or infection, but shouldn't be contributing to her symptoms.  Her lab tests were normal.  Will discuss in more detail at her next follow up.

## 2020-09-12 NOTE — Telephone Encounter (Signed)
Called and spoke with patient about xray and lab results per Dr Halford Chessman. All questions answered and patient expressed full understanding of results. Confirmed appointment for 6 minute walk tomorrow at [facility] office and follow up appointment with Dr Halford Chessman on Friday 10/07/2020 @1030am  in [place]. Nothing further needed at this time.

## 2020-09-13 ENCOUNTER — Ambulatory Visit (INDEPENDENT_AMBULATORY_CARE_PROVIDER_SITE_OTHER): Payer: Medicare Other

## 2020-09-13 ENCOUNTER — Other Ambulatory Visit: Payer: Self-pay

## 2020-09-13 DIAGNOSIS — R0602 Shortness of breath: Secondary | ICD-10-CM

## 2020-09-26 DIAGNOSIS — Z79899 Other long term (current) drug therapy: Secondary | ICD-10-CM | POA: Diagnosis not present

## 2020-09-26 DIAGNOSIS — G894 Chronic pain syndrome: Secondary | ICD-10-CM | POA: Diagnosis not present

## 2020-09-26 DIAGNOSIS — M545 Low back pain, unspecified: Secondary | ICD-10-CM | POA: Diagnosis not present

## 2020-10-03 ENCOUNTER — Other Ambulatory Visit (HOSPITAL_COMMUNITY)
Admission: RE | Admit: 2020-10-03 | Discharge: 2020-10-03 | Disposition: A | Discharge status: Home / Self Care | Payer: Medicare Other | Source: Ambulatory Visit | Attending: Pulmonary Disease | Admitting: Pulmonary Disease

## 2020-10-03 ENCOUNTER — Other Ambulatory Visit: Payer: Self-pay

## 2020-10-03 DIAGNOSIS — Z20822 Contact with and (suspected) exposure to covid-19: Secondary | ICD-10-CM | POA: Insufficient documentation

## 2020-10-03 DIAGNOSIS — Z01812 Encounter for preprocedural laboratory examination: Secondary | ICD-10-CM | POA: Insufficient documentation

## 2020-10-04 LAB — SARS CORONAVIRUS 2 (TAT 6-24 HRS): SARS Coronavirus 2: NEGATIVE

## 2020-10-06 ENCOUNTER — Other Ambulatory Visit: Payer: Self-pay

## 2020-10-06 ENCOUNTER — Ambulatory Visit (HOSPITAL_COMMUNITY)
Admission: RE | Admit: 2020-10-06 | Discharge: 2020-10-06 | Disposition: A | Discharge status: Home / Self Care | Payer: Medicare Other | Source: Ambulatory Visit | Attending: Pulmonary Disease | Admitting: Pulmonary Disease

## 2020-10-06 DIAGNOSIS — R0602 Shortness of breath: Secondary | ICD-10-CM | POA: Insufficient documentation

## 2020-10-06 MED ORDER — ALBUTEROL SULFATE (2.5 MG/3ML) 0.083% IN NEBU
2.5000 mg | INHALATION_SOLUTION | Freq: Once | RESPIRATORY_TRACT | Status: AC
  Administered 2020-10-06: 2.5 mg via RESPIRATORY_TRACT

## 2020-10-07 ENCOUNTER — Other Ambulatory Visit: Payer: Self-pay

## 2020-10-07 ENCOUNTER — Encounter: Payer: Self-pay | Admitting: Pulmonary Disease

## 2020-10-07 ENCOUNTER — Ambulatory Visit (INDEPENDENT_AMBULATORY_CARE_PROVIDER_SITE_OTHER): Payer: Medicare Other | Admitting: Pulmonary Disease

## 2020-10-07 VITALS — BP 110/68 | HR 78 | Temp 97.4°F | Ht 63.5 in | Wt 130.8 lb

## 2020-10-07 DIAGNOSIS — Z23 Encounter for immunization: Secondary | ICD-10-CM | POA: Diagnosis not present

## 2020-10-07 DIAGNOSIS — R942 Abnormal results of pulmonary function studies: Secondary | ICD-10-CM

## 2020-10-07 DIAGNOSIS — R0609 Other forms of dyspnea: Secondary | ICD-10-CM

## 2020-10-07 DIAGNOSIS — I2510 Atherosclerotic heart disease of native coronary artery without angina pectoris: Secondary | ICD-10-CM | POA: Diagnosis not present

## 2020-10-07 DIAGNOSIS — R0600 Dyspnea, unspecified: Secondary | ICD-10-CM | POA: Diagnosis not present

## 2020-10-07 LAB — PULMONARY FUNCTION TEST
DL/VA % pred: 84 %
DL/VA: 3.45 ml/min/mmHg/L
DLCO unc % pred: 68 %
DLCO unc: 12.97 ml/min/mmHg
FEF 25-75 Post: 2.95 L/sec
FEF 25-75 Pre: 2.88 L/sec
FEF2575-%Change-Post: 2 %
FEF2575-%Pred-Post: 183 %
FEF2575-%Pred-Pre: 179 %
FEV1-%Change-Post: 0 %
FEV1-%Pred-Post: 90 %
FEV1-%Pred-Pre: 90 %
FEV1-Post: 1.89 L
FEV1-Pre: 1.87 L
FEV1FVC-%Change-Post: 0 %
FEV1FVC-%Pred-Pre: 118 %
FEV6-%Change-Post: 2 %
FEV6-%Pred-Post: 80 %
FEV6-%Pred-Pre: 79 %
FEV6-Post: 2.13 L
FEV6-Pre: 2.09 L
FEV6FVC-%Change-Post: 0 %
FEV6FVC-%Pred-Post: 104 %
FEV6FVC-%Pred-Pre: 105 %
FVC-%Change-Post: 1 %
FVC-%Pred-Post: 77 %
FVC-%Pred-Pre: 76 %
FVC-Post: 2.15 L
FVC-Pre: 2.11 L
Post FEV1/FVC ratio: 88 %
Post FEV6/FVC ratio: 99 %
Pre FEV1/FVC ratio: 88 %
Pre FEV6/FVC Ratio: 100 %
RV % pred: 105 %
RV: 2.43 L
TLC % pred: 91 %
TLC: 4.62 L

## 2020-10-07 NOTE — Progress Notes (Signed)
[name]  Chief Complaint  Patient presents with   Follow-up    shortness of breath with activity    Constitutional:  BP 110/68 (BP Location: Left Arm, Cuff Size: Normal)    Pulse 78    Temp (!) 97.4 F (36.3 C) (Other (Comment)) Comment (Src): wrist   Ht 5' 3.5" (1.613 m)    Wt 130 lb 12.8 oz (59.3 kg)    SpO2 99% Comment: Room air   BMI 22.81 kg/m   Past Medical History:  Allergies, Anxiety, OA, Cataract, Back pain, CKD, Depression, Fibromyalgia, GERD, Glaucoma, Hypothyroidism, Macular degeneration  Past Surgical History:  Her  has a past surgical history that includes Abdominal hysterectomy; back stimulatory; Tonsillectomy; Cholecystectomy; Colonoscopy (N/A, 11/11/2015); Back surgery; Cataract extraction w/PHACO (Right, 03/22/2016); Cataract extraction w/PHACO (Left, 04/19/2016); Back surgery; Abdominal hysterectomy; back stimulator removed (11/15/2016); Esophagogastroduodenoscopy (N/A, 12/19/2016); Laparoscopic removal of mesenteric mass (N/A, 10/30/2017); Laparoscopic small bowel resection (N/A, 10/30/2017); laparoscopic appendectomy (N/A, 10/30/2017); Appendectomy; Cosmetic surgery; Eye surgery; and Small intestine surgery.  Brief Summary:  [name] is a 79 y.o. female with shortness of breath.      Subjective:   Since her last visit she did PFT.  This showed normal spirometry and lung volumes.  She had mild diffusion defect, but better than results from 2016.  She also did 6 minute walk test on 09/13/20.  She stopped with 3:40 left of test due to dizziness, unsteady gait, and dyspnea.  She maintained her SpO2 on room air.    She continues to have episodes of dyspnea with activity.  These happen several times per day.  She feels a cold feeling come over her body, then feels dizzy, and can't breath.     Physical Exam:   Appearance - well kempt   ENMT - no sinus tenderness, no oral exudate, no LAN, Mallampati 2 airway, no  stridor  Respiratory - equal breath sounds bilaterally, no wheezing or rales  CV - s1s2 regular rate and rhythm, no murmurs  Ext - no clubbing, no edema  Skin - no rashes  Psych - normal mood and affect   Pulmonary testing:   PFT 12/08/14 >> 1.83 (82%), FEV1% 89, TLC 4.47 (87%), DLCO 63%  PFT 10/06/20 >> FEV1 1.89 (90%), FEV1% 88, TLC 4.62 (91%), DLCO 68%  Chest Imaging:    Cardiac Tests:   Nuclear stress test 08/02/20 >>no ischemia  Echo 08/04/20 >> EF 60 to 65%, RVSP 25.7 mmHg, mild AR   Social History:  She  reports that she has never smoked. She has never used smokeless tobacco. She reports current alcohol use. She reports that she does not use drugs.  Family History:  Her family history includes Allergies in her daughter and mother; Heart disease in her mother; Hodgkin's lymphoma in her father; Hyperlipidemia in her mother; Liver cancer in her sister.    Labs:   CMP Latest Ref Rng & Units 09/07/2020 07/11/2018 10/31/2017  Glucose 70 - 99 mg/dL 104(H) - 129(H)  BUN 8 - 23 mg/dL 24(H) 15 11  Creatinine 0.44 - 1.00 mg/dL 1.11(H) 1.07 1.06(H)  Sodium 135 - 145 mmol/L 135 140 138  Potassium 3.5 - 5.1 mmol/L 5.1 4.6 4.6  Chloride 98 - 111 mmol/L 101 104 105  CO2 22 - 32 mmol/L 27 31 25   Calcium 8.9 - 10.3 mg/dL 8.9 8.9 8.0(L)  Total Protein 6.5 - 8.1 g/dL 6.6 6.1(A) -  Total Bilirubin 0.3 - 1.2 mg/dL  0.6 0.3 -  Alkaline Phos 38 - 126 U/L 62 76 -  AST 15 - 41 U/L 17 18 -  ALT 0 - 44 U/L 12 9 -    CBC Latest Ref Rng & Units 09/07/2020 07/11/2018 10/31/2017  WBC 4.0 - 10.5 K/uL 7.4 4.6 13.8(H)  Hemoglobin 12.0 - 15.0 g/dL 11.9(L) - 10.4(L)  Hematocrit 36 - 46 % 37.1 38 31.4(L)  Platelets 150 - 400 K/uL 288 - 225    Lab Results  Component Value Date   ANA Negative 09/07/2020   RF 10.5 09/07/2020    Lab Results  Component Value Date   ESRSEDRATE 64 (H) 09/07/2020    Discussion:  She has symptoms of dyspnea and feeling flush for the past 1 to 2 years.  These  episodes seem to be associated with positional changes.  Assessment/Plan:   Dyspnea. - she has mild diffusion defect on PFT and significance of this is uncertain - will arrange for V/Q scan with chest xray to assess whether she could have chronic thromboembolic disease - will arrange for cardiopulmonary exercise test to further assess - concerned she could have autonomic dysfunction (such as POTS) that could be causing her symptoms with positional changes >> will check whether she needs to be assessed by EP cardiology  Vaccinations. - high dose influenza vaccine today  Time Spent Involved in Patient Care on Day of Examination:  34 minutes  Follow up:  Patient Instructions  Will arrange for V/Q scan with chest xray and cardiopulmonary exercise test  Follow up in 3 months   Medication List:   Allergies as of 10/07/2020      Reactions   Doxycycline Rash   Rash on hands      Medication List       Accurate as of October 07, 2020 11:11 AM. If you have any questions, ask your nurse or doctor.        STOP taking these medications   OVER THE COUNTER MEDICATION Stopped by: Chesley Mires, MD     TAKE these medications   amitriptyline 25 MG tablet Commonly known as: ELAVIL Take 25 mg by mouth at bedtime. What changed: Another medication with the same name was removed. Continue taking this medication, and follow the directions you see here. Changed by: Chesley Mires, MD   cetirizine 10 MG tablet Commonly known as: ZYRTEC Take 1 tablet by mouth at bedtime.   Dymista 137-50 MCG/ACT Susp Generic drug: Azelastine-Fluticasone Place 2 sprays into both nostrils daily as needed (for allergies).   estradiol 0.025 mg/24hr patch Commonly known as: CLIMARA - Dosed in mg/24 hr Place 0.025 mg onto the skin every Monday.   famotidine 20 MG tablet Commonly known as: PEPCID Take 20 mg by mouth 2 (two) times daily.   FLUoxetine 20 MG tablet Commonly known as: PROZAC Take 20 mg by  mouth daily.   HYDROcodone-acetaminophen 5-325 MG tablet Commonly known as: NORCO/VICODIN Take 1 tablet by mouth 4 (four) times daily as needed for moderate pain.   levothyroxine 75 MCG tablet Commonly known as: SYNTHROID Take 1 tablet (75 mcg total) by mouth daily.   OVER THE COUNTER MEDICATION Take 2 capsules by mouth daily. Premiere Formula Ocular Nutrition ([place])       Signature:  Chesley Mires, MD [place] Pager - [phone] 10/07/2020, 11:11 AM

## 2020-10-07 NOTE — Patient Instructions (Signed)
Will arrange for V/Q scan with chest xray and cardiopulmonary exercise test  Follow up in 3 months

## 2020-10-12 ENCOUNTER — Telehealth: Payer: Self-pay | Admitting: Pulmonary Disease

## 2020-10-12 NOTE — Telephone Encounter (Signed)
Advised pt that I have communicated with cardiology.  Plan is to proceed with V/Q scan and CPET and then determine if she needs f/u with cardiology.

## 2020-10-14 ENCOUNTER — Ambulatory Visit (HOSPITAL_COMMUNITY)
Admission: RE | Admit: 2020-10-14 | Discharge: 2020-10-14 | Disposition: A | Discharge status: Home / Self Care | Payer: Medicare Other | Source: Ambulatory Visit | Attending: Pulmonary Disease | Admitting: Pulmonary Disease

## 2020-10-14 ENCOUNTER — Other Ambulatory Visit: Payer: Self-pay | Admitting: Pulmonary Disease

## 2020-10-14 ENCOUNTER — Encounter (HOSPITAL_COMMUNITY): Payer: Self-pay

## 2020-10-14 ENCOUNTER — Other Ambulatory Visit: Payer: Self-pay

## 2020-10-14 DIAGNOSIS — R0602 Shortness of breath: Secondary | ICD-10-CM | POA: Diagnosis not present

## 2020-10-14 DIAGNOSIS — R0609 Other forms of dyspnea: Secondary | ICD-10-CM

## 2020-10-14 DIAGNOSIS — R942 Abnormal results of pulmonary function studies: Secondary | ICD-10-CM | POA: Diagnosis not present

## 2020-10-14 DIAGNOSIS — R0600 Dyspnea, unspecified: Secondary | ICD-10-CM

## 2020-10-14 DIAGNOSIS — R079 Chest pain, unspecified: Secondary | ICD-10-CM | POA: Diagnosis not present

## 2020-10-14 MED ORDER — TECHNETIUM TO 99M ALBUMIN AGGREGATED
4.0000 | Freq: Once | INTRAVENOUS | Status: AC | PRN
  Administered 2020-10-14: 4.1 via INTRAVENOUS

## 2020-10-15 DIAGNOSIS — Z23 Encounter for immunization: Secondary | ICD-10-CM | POA: Diagnosis not present

## 2020-10-25 ENCOUNTER — Telehealth: Payer: Self-pay | Admitting: Pulmonary Disease

## 2020-10-25 MED ORDER — DULERA 100-5 MCG/ACT IN AERO: 2.0000 | INHALATION_SPRAY | Freq: Two times a day (BID) | RESPIRATORY_TRACT | 11 refills | Status: DC

## 2020-10-25 NOTE — Telephone Encounter (Signed)
DG Chest 2 View  Result Date: 10/15/2020 CLINICAL DATA:  Dyspnea EXAM: CHEST - 2 VIEW COMPARISON:  09/05/2020 FINDINGS: Hyperinflation with chronic bronchitic change. No focal airspace disease or effusion. Stable cardiomediastinal silhouette. No pneumothorax. IMPRESSION: Hyperinflation with chronic bronchitic change. Electronically Signed   By: Donavan Foil M.D.   On: 10/15/2020 21:24   NM Pulmonary Perfusion  Result Date: 10/16/2020 CLINICAL DATA:  Chest pain or SOB, pleurisy or effusion suspected dyspnea, diffusion defect on PFT EXAM: NUCLEAR MEDICINE PERFUSION LUNG SCAN TECHNIQUE: Perfusion images were obtained in multiple projections after intravenous injection of radiopharmaceutical. Ventilation scans intentionally deferred if perfusion scan and chest x-ray adequate for interpretation during COVID 19 epidemic. RADIOPHARMACEUTICALS:  4.1 mCi Tc-42m MAA IV COMPARISON:  PET-CT 09/12/2017 FINDINGS: Mildly heterogenous distribution of radiopharmaceutical. No segmental or subsegmental perfusion defects. IMPRESSION: No segmental or subsegmental perfusion defects to suggest PE. Electronically Signed   By: Lucrezia Europe M.D.   On: 10/16/2020 10:19     Results d/w pt.  Explained that CXR shows hyperinflation which is suggestive of asthma.  She was seen by Dr. Melvyn Novas in 2016 and tried on dulera with improvement in cough.  She has history of allergies.  Will have her try dulera again.  Advised that she should also proceed with doing cardiopulmonary exercise test.

## 2020-10-26 DIAGNOSIS — G894 Chronic pain syndrome: Secondary | ICD-10-CM | POA: Diagnosis not present

## 2020-10-26 DIAGNOSIS — Z79899 Other long term (current) drug therapy: Secondary | ICD-10-CM | POA: Diagnosis not present

## 2020-10-26 DIAGNOSIS — M545 Low back pain, unspecified: Secondary | ICD-10-CM | POA: Diagnosis not present

## 2020-10-28 ENCOUNTER — Telehealth: Payer: Self-pay | Admitting: Pulmonary Disease

## 2020-10-28 MED ORDER — DULERA 100-5 MCG/ACT IN AERO: 2.0000 | INHALATION_SPRAY | Freq: Two times a day (BID) | RESPIRATORY_TRACT | 11 refills | Status: DC

## 2020-10-28 NOTE — Telephone Encounter (Signed)
Called and spoke with patient who states that her pharmacy does not have any Dulera to fill patients prescription and she has none is there something else she can use. I asked patient if there was another pharmacy that she would like [place] to send the RX to to try and get it filled for her. She advised to send it to [facility]. Rx has been sent there. Nothing further needed at this time.

## 2020-10-31 ENCOUNTER — Ambulatory Visit (HOSPITAL_COMMUNITY): Payer: Medicare Other | Attending: Pulmonary Disease

## 2020-10-31 ENCOUNTER — Other Ambulatory Visit: Payer: Self-pay

## 2020-10-31 DIAGNOSIS — R0609 Other forms of dyspnea: Secondary | ICD-10-CM

## 2020-10-31 DIAGNOSIS — R0600 Dyspnea, unspecified: Secondary | ICD-10-CM | POA: Diagnosis not present

## 2020-10-31 DIAGNOSIS — R942 Abnormal results of pulmonary function studies: Secondary | ICD-10-CM | POA: Insufficient documentation

## 2020-11-04 ENCOUNTER — Telehealth: Payer: Self-pay | Admitting: *Deleted

## 2020-11-04 ENCOUNTER — Telehealth: Payer: Self-pay | Admitting: Pulmonary Disease

## 2020-11-04 NOTE — Telephone Encounter (Signed)
Left message on voicemail to please return phone call to go over results. Contact number provided.

## 2020-11-04 NOTE — Telephone Encounter (Signed)
CPET 10/31/20 >> no cardiopulmonary abnormality noted   Please let her know that the exercise test didn't show a specific cause for her symptoms.  Dr. Myles Gip office will contact her to arrange for further assessment of her blood pressure.

## 2020-11-04 NOTE — Telephone Encounter (Signed)
-----   Message from Satira Sark, MD sent at 11/04/2020  4:22 PM EST ----- I think I said something to [facility] in [place] about this already.  Dr. Halford Chessman would just like for her to have a full set of orthostatic vital signs mainly to exclude POTS.  She just needs a nurse visit for a full set of orthostatic vital signs. ----- Message ----- From: Merlene Laughter, RN Sent: 11/04/2020   3:12 PM EST To: Satira Sark, MD  She has no follow up with us-I can schedule her with Jonni Sanger on Monday or should this go to her PCP?  ----- Message ----- From: Bernita Raisin, RN Sent: 11/04/2020   3:01 PM EST To: Merlene Laughter, RN   ----- Message ----- From: Satira Sark, MD Sent: 11/04/2020   2:57 PM EST To: Chesley Mires, MD, Bernita Raisin, RN  Dr. Halford Chessman would like for [place] to obtain a good set of orthostatic vital signs.  He had thought about the possibility of POTS.  Can you please set up a nurse visit for complete set of orthostatic vital signs?  Thank you. ----- Message ----- From: Chesley Mires, MD Sent: 11/04/2020   1:51 PM EST To: Satira Sark, MD  Will route to Dr. Domenic Polite with cardiology to follow up with patient and discuss assessing orthostatic blood pressure measurements.

## 2020-11-07 NOTE — Telephone Encounter (Signed)
Patient informed and verbalized understanding of plan. 

## 2020-11-07 NOTE — Telephone Encounter (Signed)
Called and went over exercise test results per Dr Halford Chessman with patient. All questions answered and patient expressed full understanding of Dr Juanetta Gosling message. Nothing further needed at this time.

## 2020-11-14 ENCOUNTER — Ambulatory Visit: Payer: Medicare Other | Admitting: *Deleted

## 2020-11-14 DIAGNOSIS — R0602 Shortness of breath: Secondary | ICD-10-CM

## 2020-11-14 NOTE — Progress Notes (Signed)
Pt here for orthostatic BPs - denies any symptoms at this time - says she only has symptoms when she is being active when she get SOB - denies any dizziness/palpitaions - BPs documented and will forward to provider

## 2020-11-14 NOTE — Progress Notes (Signed)
Pt voiced understanding

## 2020-11-14 NOTE — Progress Notes (Signed)
Thank you.  I reviewed the orthostatic vital signs.  She had neither evidence of orthostatic hypotension nor increase in heart rate to suggest POTS.  I will forward this to Dr. Halford Chessman for his review.

## 2020-12-08 NOTE — Progress Notes (Deleted)
Cardiology Office Note  Date: 12/08/2020   ID: [name], [name] 07-05-1941, MRN [id number]  PCP:  Celene Squibb, MD  Cardiologist:  Rozann Lesches, MD Electrophysiologist:  None   Chief Complaint: follow up SOB, Carotid artery disease  History of Present Illness: [name] is a 80 y.o. female with a history of CHF, SOB, carotid artery disease.  Last saw Dr Domenic Polite 07/28/2020: Over the prior 2 years she had been experiencing increasing DOE. Described SOB at times to the point of having to stop and rest. Symptoms more prominent recently. Had a previous lifeline screening with noted carotid artery disease. EKG NSR with PAC's. Recent LDL 129. Echocardiogram, Lexiscan stress, and carotid duplex were ordered.  Past Medical History:  Diagnosis Date  . Allergy   . Anxiety   . Arthritis   . Cataract   . CHF (congestive heart failure) ([place])   . Chronic back pain   . Chronic kidney disease   . Chronic sinusitis   . Depression   . Fibromyalgia   . GERD (gastroesophageal reflux disease)   . Glaucoma   . Hypothyroidism   . Macular degeneration   . PONV (postoperative nausea and vomiting)   . Renal insufficiency   . Skin cancer     Past Surgical History:  Procedure Laterality Date  . ABDOMINAL HYSTERECTOMY     1985  . ABDOMINAL HYSTERECTOMY    . APPENDECTOMY    . back stimulator removed  11/15/2016  . back stimulatory    . BACK SURGERY     plate and screws  . BACK SURGERY     plate in back  . CATARACT EXTRACTION W/PHACO Right 03/22/2016   Procedure: CATARACT EXTRACTION PHACO AND INTRAOCULAR LENS PLACEMENT (IOC);  Surgeon: Tonny Branch, MD;  Location: [facility];  Service: Ophthalmology;  Laterality: Right;  CDE 5.69  . CATARACT EXTRACTION W/PHACO Left 04/19/2016   Procedure: CATARACT EXTRACTION PHACO AND INTRAOCULAR LENS PLACEMENT (IOC);  Surgeon: Tonny Branch, MD;  Location: [facility];  Service: Ophthalmology;  Laterality: Left;  CDE 5.05  . CHOLECYSTECTOMY     2014,  non-functioning GB  . COLONOSCOPY N/A 11/11/2015   Procedure: COLONOSCOPY;  Surgeon: Rogene Houston, MD;  Location: [facility];  Service: Endoscopy;  Laterality: N/A;  1:30  . COSMETIC SURGERY    . ESOPHAGOGASTRODUODENOSCOPY N/A 12/19/2016   Procedure: ESOPHAGOGASTRODUODENOSCOPY (EGD);  Surgeon: Rogene Houston, MD;  [facility];  Service: Endoscopy;  Laterality: N/A;  2:25  . EYE SURGERY    . LAPAROSCOPIC APPENDECTOMY N/A 10/30/2017   Procedure: APPENDECTOMY LAPAROSCOPIC;  Surgeon: Jovita Kussmaul, MD;  Location: [facility];  Service: General;  Laterality: N/A;  . LAPAROSCOPIC REMOVAL OF MESENTERIC MASS N/A 10/30/2017   Procedure: LAPAROSCOPIC RESECTION OF MASS SMALL BOWEL MESENTRY;  Surgeon: Jovita Kussmaul, MD;  Location: [facility];  Service: General;  Laterality: N/A;  . LAPAROSCOPIC SMALL BOWEL RESECTION N/A 10/30/2017   Procedure: LAPAROSCOPIC ASSISTED SMALL BOWEL RESECTION;  Surgeon: Jovita Kussmaul, MD;  Location: [facility];  Service: General;  Laterality: N/A;  . SMALL INTESTINE SURGERY    . TONSILLECTOMY      Current Outpatient Medications  Medication Sig Dispense Refill  . amitriptyline (ELAVIL) 25 MG tablet Take 25 mg by mouth at bedtime.    . Azelastine-Fluticasone (DYMISTA) 137-50 MCG/ACT SUSP Place 2 sprays into both nostrils daily as needed (for allergies).    . cetirizine (ZYRTEC) 10 MG tablet Take 1 tablet by  mouth at bedtime.    Marland Kitchen estradiol (CLIMARA - DOSED IN MG/24 HR) 0.025 mg/24hr patch Place 0.025 mg onto the skin every Monday.   6  . famotidine (PEPCID) 20 MG tablet Take 20 mg by mouth 2 (two) times daily.    Marland Kitchen FLUoxetine (PROZAC) 20 MG tablet Take 20 mg by mouth daily.    Marland Kitchen HYDROcodone-acetaminophen (NORCO/VICODIN) 5-325 MG per tablet Take 1 tablet by mouth 4 (four) times daily as needed for moderate pain.     Marland Kitchen levothyroxine (SYNTHROID) 75 MCG tablet Take 1 tablet (75 mcg total) by mouth daily. 90 tablet 3  . mometasone-formoterol (DULERA) 100-5 MCG/ACT AERO Inhale 2  puffs into the lungs 2 (two) times daily. 1 each 11  . OVER THE COUNTER MEDICATION Take 2 capsules by mouth daily. Premiere Formula Ocular Nutrition ([place])     No current facility-administered medications for this visit.   Allergies:  Doxycycline   Social History: The patient  reports that she has never smoked. She has never used smokeless tobacco. She reports current alcohol use. She reports that she does not use drugs.   Family History: The patient's family history includes Allergies in her daughter and mother; Heart disease in her mother; Hodgkin's lymphoma in her father; Hyperlipidemia in her mother; Liver cancer in her sister.   ROS:  Please see the history of present illness. Otherwise, complete review of systems is positive for {NONE DEFAULTED:18576::"none"}.  All other systems are reviewed and negative.   Physical Exam: VS:  There were no vitals taken for this visit., BMI There is no height or weight on file to calculate BMI.  Wt Readings from Last 3 Encounters:  10/07/20 130 lb 12.8 oz (59.3 kg)  09/05/20 130 lb 9.6 oz (59.2 kg)  07/28/20 131 lb (59.4 kg)    General: Patient appears comfortable at rest. HEENT: Conjunctiva and lids normal, oropharynx clear with moist mucosa. Neck: Supple, no elevated JVP or carotid bruits, no thyromegaly. Lungs: Clear to auscultation, nonlabored breathing at rest. Cardiac: Regular rate and rhythm, no S3 or significant systolic murmur, no pericardial rub. Abdomen: Soft, nontender, no hepatomegaly, bowel sounds present, no guarding or rebound. Extremities: No pitting edema, distal pulses 2+. Skin: Warm and dry. Musculoskeletal: No kyphosis. Neuropsychiatric: Alert and oriented x3, affect grossly appropriate.  ECG:  {EKG/Telemetry Strips [phone]}  Recent Labwork: 09/07/2020: ALT 12; AST 17; BUN 24; Creatinine, Ser 1.11; Hemoglobin 11.9; Platelets 288; Potassium 5.1; Sodium 135     Component Value Date/Time   CHOL 185  07/11/2018 1005   TRIG 114 07/11/2018 1005   HDL 60 07/11/2018 1005   CHOLHDL 3.1 07/11/2018 1005   LDLCALC 104 07/11/2018 1005    Other Studies Reviewed Today:  CPX 10/31/2020 Interpretation   Notes: Patient gave a very good effort. Pulse-oximetry remained 94-97% for the duration of exercise. Exercise began in stage 3 of Mod[place] protocol to meet patient's fitness level (see staged-time down data attached).   ECG: Resting ECG in normal sinus rhythm. HR response appropriate. There were no sustained arrhythmias or ST-T changes. BP response appropriate.   PFT: Pre-exercise spirometry was within mostly-normal limits. The MVV was normal.   CPX: Exercise Capacity- Exercise testing with gas exchange demonstrates a normal (excellent) peak VO2 of 23.3 ml/kg/min (126% of the age/gender/weight matched sedentary norms). The RER of 1.05 indicates a near maximal effort.   Cardiovascular response- The O2pulse (a surrogate for stroke volume) increased with incremental exercise, reaching peak at 10 ml/beat (125% predicted).  Ventilatory response- The VE/VCO2 slope moderately elevated and indicates Increased dead space ventilation. The oxygen uptake efficiency slope (OUES) is normal. The VO2 at the ventilatory threshold was normal at 96% of the predicted peak VO2. At peak exercise, the ventilation reached 77% of the measured MVV and breathing reserve was 18 indicating ventilatory reserve nearly depleted. PETCO2 was  low at 27 mmHg during exercise.    Conclusion: Exercise testing with gas exchange demonstrates excellent functional capacity when compared to matched sedentary norms. There is no cardiopulmonary abnormality noted. At peak exercise, patient is ventilatory limited as patient is nearly depleting breathing reserve. Elevated VE/VCO2 slope with low PETCO2 are most likely related to hyperventilation, particularly with low breathing reserve.  Of note, patient did not experience one of the  "episodes" she describes in her complaint. There is evidence of deconditioning and patient would likely improve with routine cardiovascular exercise routine.   Carotid artery duplex 08/04/2020 Left/Right Carotid: The extracranial vessels were near-normal with only minimal wall thickening or plaque. Vertebrals: Bilateral vertebral arteries demonstrate antegrade flow. Subclavians: Normal flow hemodynamics were seen in bilateral subclavian arteries.   Echocardiogram 08/04/2020 1. Left ventricular ejection fraction, by estimation, is 60 to 65%. The left ventricle has normal function. The left ventricle has no regional wall motion abnormalities. Left ventricular diastolic parameters are indeterminate. Normal global longitudinal strain of -21.2%. 2. Right ventricular systolic function is normal. The right ventricular size is normal. There is normal pulmonary artery systolic pressure. The estimated right ventricular systolic pressure is 00.1 mmHg. 3. The mitral valve is grossly normal. Trivial mitral valve regurgitation. 4. The aortic valve is tricuspid. Aortic valve regurgitation is mild. 5. The inferior vena cava is normal in size with greater than 50% respiratory variability, suggesting right atrial pressure of 3 mmHg.   Assessment and Plan:  1. SOB (shortness of breath)   2. Carotid artery disease, unspecified laterality, unspecified type ([place])   3. Nonspecific abnormal electrocardiogram (ECG) (EKG)   4. Mixed hyperlipidemia      Medication Adjustments/Labs and Tests Ordered: Current medicines are reviewed at length with the patient today.  Concerns regarding medicines are outlined above.   Disposition: Follow-up with ***  Signed, Levell July, NP 12/08/2020 7:09 PM    [facility]  [place], [place], [place] [address] Phone: [phone]; Fax: [phone]

## 2020-12-09 ENCOUNTER — Ambulatory Visit: Payer: Medicare Other | Admitting: Family Medicine

## 2020-12-09 DIAGNOSIS — E782 Mixed hyperlipidemia: Secondary | ICD-10-CM

## 2020-12-09 DIAGNOSIS — I779 Disorder of arteries and arterioles, unspecified: Secondary | ICD-10-CM

## 2020-12-09 DIAGNOSIS — R9431 Abnormal electrocardiogram [ECG] [EKG]: Secondary | ICD-10-CM

## 2020-12-09 DIAGNOSIS — R0602 Shortness of breath: Secondary | ICD-10-CM

## 2020-12-16 ENCOUNTER — Ambulatory Visit: Payer: Medicare Other | Admitting: Family Medicine

## 2020-12-16 DIAGNOSIS — H353123 Nonexudative age-related macular degeneration, left eye, advanced atrophic without subfoveal involvement: Secondary | ICD-10-CM | POA: Diagnosis not present

## 2020-12-16 DIAGNOSIS — H31091 Other chorioretinal scars, right eye: Secondary | ICD-10-CM | POA: Diagnosis not present

## 2020-12-16 DIAGNOSIS — H43811 Vitreous degeneration, right eye: Secondary | ICD-10-CM | POA: Diagnosis not present

## 2020-12-16 DIAGNOSIS — H353213 Exudative age-related macular degeneration, right eye, with inactive scar: Secondary | ICD-10-CM | POA: Diagnosis not present

## 2020-12-18 NOTE — Progress Notes (Deleted)
Cardiology Office Note  Date: 12/18/2020   ID: Brittany IgoGlenda T Yang, DOB [birth year], MRN [id number]  PCP:  Benita StabileHall, John Z, MD  Cardiologist:  Nona DellSamuel McDowell, MD Electrophysiologist:  None   Chief Complaint: follow up SOB, Carotid artery disease  History of Present Illness: [name] is a 80 y.o. female with a history of CHF, SOB, carotid artery disease.  Last saw Dr Diona BrownerMcDowell 07/28/2020: Over the prior 2 years she had been experiencing increasing DOE. Described SOB at times to the point of having to stop and rest. Symptoms more prominent recently. Had a previous lifeline screening with noted carotid artery disease. EKG NSR with PAC's. Recent LDL 129. Echocardiogram, Lexiscan stress, and carotid duplex were ordered.  Past Medical History:  Diagnosis Date  . Allergy   . Anxiety   . Arthritis   . Cataract   . CHF (congestive heart failure) (HCC)   . Chronic back pain   . Chronic kidney disease   . Chronic sinusitis   . Depression   . Fibromyalgia   . GERD (gastroesophageal reflux disease)   . Glaucoma   . Hypothyroidism   . Macular degeneration   . PONV (postoperative nausea and vomiting)   . Renal insufficiency   . Skin cancer     Past Surgical History:  Procedure Laterality Date  . ABDOMINAL HYSTERECTOMY     1985  . ABDOMINAL HYSTERECTOMY    . APPENDECTOMY    . back stimulator removed  11/15/2016  . back stimulatory    . BACK SURGERY     plate and screws  . BACK SURGERY     plate in back  . CATARACT EXTRACTION W/PHACO Right 03/22/2016   Procedure: CATARACT EXTRACTION PHACO AND INTRAOCULAR LENS PLACEMENT (IOC);  Surgeon: Gemma PayorKerry Hunt, MD;  Location: [facility];  Service: Ophthalmology;  Laterality: Right;  CDE 5.69  . CATARACT EXTRACTION W/PHACO Left 04/19/2016   Procedure: CATARACT EXTRACTION PHACO AND INTRAOCULAR LENS PLACEMENT (IOC);  Surgeon: Gemma PayorKerry Hunt, MD;  Location: [facility];  Service: Ophthalmology;  Laterality: Left;  CDE 5.05  . CHOLECYSTECTOMY     2014,  non-functioning GB  . COLONOSCOPY N/A 11/11/2015   Procedure: COLONOSCOPY;  Surgeon: Malissa HippoNajeeb U Rehman, MD;  [facility]: [facility];  Service: Endoscopy;  Laterality: N/A;  1:30  . COSMETIC SURGERY    . ESOPHAGOGASTRODUODENOSCOPY N/A 12/19/2016   Procedure: ESOPHAGOGASTRODUODENOSCOPY (EGD);  Surgeon: Malissa HippoNajeeb U Rehman, MD;  [facility]: [facility];  Service: Endoscopy;  Laterality: N/A;  2:25  . EYE SURGERY    . LAPAROSCOPIC APPENDECTOMY N/A 10/30/2017   Procedure: APPENDECTOMY LAPAROSCOPIC;  Surgeon: Griselda Mineroth, Paul III, MD;  [facility];  Service: General;  Laterality: N/A;  . LAPAROSCOPIC REMOVAL OF MESENTERIC MASS N/A 10/30/2017   Procedure: LAPAROSCOPIC RESECTION OF MASS SMALL BOWEL MESENTRY;  Surgeon: Griselda Mineroth, Paul III, MD;  Location: [facility] OR;  Service: General;  Laterality: N/A;  . LAPAROSCOPIC SMALL BOWEL RESECTION N/A 10/30/2017   Procedure: LAPAROSCOPIC ASSISTED SMALL BOWEL RESECTION;  Surgeon: Griselda Mineroth, Paul III, MD;  Location: [facility] OR;  Service: General;  Laterality: N/A;  . SMALL INTESTINE SURGERY    . TONSILLECTOMY      Current Outpatient Medications  Medication Sig Dispense Refill  . amitriptyline (ELAVIL) 25 MG tablet Take 25 mg by mouth at bedtime.    . Azelastine-Fluticasone (DYMISTA) 137-50 MCG/ACT SUSP Place 2 sprays into both nostrils daily as needed (for allergies).    . cetirizine (ZYRTEC) 10 MG tablet Take 1 tablet by  mouth at bedtime.    Marland Kitchen estradiol (CLIMARA - DOSED IN MG/24 HR) 0.025 mg/24hr patch Place 0.025 mg onto the skin every Monday.   6  . famotidine (PEPCID) 20 MG tablet Take 20 mg by mouth 2 (two) times daily.    Marland Kitchen FLUoxetine (PROZAC) 20 MG tablet Take 20 mg by mouth daily.    Marland Kitchen HYDROcodone-acetaminophen (NORCO/VICODIN) 5-325 MG per tablet Take 1 tablet by mouth 4 (four) times daily as needed for moderate pain.     Marland Kitchen levothyroxine (SYNTHROID) 75 MCG tablet Take 1 tablet (75 mcg total) by mouth daily. 90 tablet 3  . mometasone-formoterol (DULERA) 100-5 MCG/ACT AERO Inhale 2  puffs into the lungs 2 (two) times daily. 1 each 11  . OVER THE COUNTER MEDICATION Take 2 capsules by mouth daily. Premiere Formula Ocular Nutrition (Presivision)     No current facility-administered medications for this visit.   Allergies:  Doxycycline   Social History: The patient  reports that she has never smoked. She has never used smokeless tobacco. She reports current alcohol use. She reports that she does not use drugs.   Family History: The patient's family history includes Allergies in her daughter and mother; Heart disease in her mother; Hodgkin's lymphoma in her father; Hyperlipidemia in her mother; Liver cancer in her sister.   ROS:  Please see the history of present illness. Otherwise, complete review of systems is positive for {NONE DEFAULTED:18576::"none"}.  All other systems are reviewed and negative.   Physical Exam: VS:  There were no vitals taken for this visit., BMI There is no height or weight on file to calculate BMI.  Wt Readings from Last 3 Encounters:  10/07/20 130 lb 12.8 oz (59.3 kg)  09/05/20 130 lb 9.6 oz (59.2 kg)  07/28/20 131 lb (59.4 kg)    General: Patient appears comfortable at rest. HEENT: Conjunctiva and lids normal, oropharynx clear with moist mucosa. Neck: Supple, no elevated JVP or carotid bruits, no thyromegaly. Lungs: Clear to auscultation, nonlabored breathing at rest. Cardiac: Regular rate and rhythm, no S3 or significant systolic murmur, no pericardial rub. Abdomen: Soft, nontender, no hepatomegaly, bowel sounds present, no guarding or rebound. Extremities: No pitting edema, distal pulses 2+. Skin: Warm and dry. Musculoskeletal: No kyphosis. Neuropsychiatric: Alert and oriented x3, affect grossly appropriate.  ECG:  {EKG/Telemetry Strips [phone]}  Recent Labwork: 09/07/2020: ALT 12; AST 17; BUN 24; Creatinine, Ser 1.11; Hemoglobin 11.9; Platelets 288; Potassium 5.1; Sodium 135     Component Value Date/Time   CHOL 185  07/11/2018 1005   TRIG 114 07/11/2018 1005   HDL 60 07/11/2018 1005   CHOLHDL 3.1 07/11/2018 1005   LDLCALC 104 07/11/2018 1005    Other Studies Reviewed Today:  CPX 10/31/2020 Interpretation   Notes: Patient gave a very good effort. Pulse-oximetry remained 94-97% for the duration of exercise. Exercise began in stage 3 of ModNaughton protocol to meet patient's fitness level (see staged-time down data attached).   ECG: Resting ECG in normal sinus rhythm. HR response appropriate. There were no sustained arrhythmias or ST-T changes. BP response appropriate.   PFT: Pre-exercise spirometry was within mostly-normal limits. The MVV was normal.   CPX: Exercise Capacity- Exercise testing with gas exchange demonstrates a normal (excellent) peak VO2 of 23.3 ml/kg/min (126% of the age/gender/weight matched sedentary norms). The RER of 1.05 indicates a near maximal effort.   Cardiovascular response- The O2pulse (a surrogate for stroke volume) increased with incremental exercise, reaching peak at 10 ml/beat (125% predicted).  Ventilatory response- The VE/VCO2 slope moderately elevated and indicates Increased dead space ventilation. The oxygen uptake efficiency slope (OUES) is normal. The VO2 at the ventilatory threshold was normal at 96% of the predicted peak VO2. At peak exercise, the ventilation reached 77% of the measured MVV and breathing reserve was 18 indicating ventilatory reserve nearly depleted. PETCO2 was  low at 27 mmHg during exercise.    Conclusion: Exercise testing with gas exchange demonstrates excellent functional capacity when compared to matched sedentary norms. There is no cardiopulmonary abnormality noted. At peak exercise, patient is ventilatory limited as patient is nearly depleting breathing reserve. Elevated VE/VCO2 slope with low PETCO2 are most likely related to hyperventilation, particularly with low breathing reserve.  Of note, patient did not experience one of the  "episodes" she describes in her complaint. There is evidence of deconditioning and patient would likely improve with routine cardiovascular exercise routine.   Carotid artery duplex 08/04/2020 Left/Right Carotid: The extracranial vessels were near-normal with only minimal wall thickening or plaque. Vertebrals: Bilateral vertebral arteries demonstrate antegrade flow. Subclavians: Normal flow hemodynamics were seen in bilateral subclavian arteries.   Echocardiogram 08/04/2020 1. Left ventricular ejection fraction, by estimation, is 60 to 65%. The left ventricle has normal function. The left ventricle has no regional wall motion abnormalities. Left ventricular diastolic parameters are indeterminate. Normal global longitudinal strain of -21.2%. 2. Right ventricular systolic function is normal. The right ventricular size is normal. There is normal pulmonary artery systolic pressure. The estimated right ventricular systolic pressure is 67.2 mmHg. 3. The mitral valve is grossly normal. Trivial mitral valve regurgitation. 4. The aortic valve is tricuspid. Aortic valve regurgitation is mild. 5. The inferior vena cava is normal in size with greater than 50% respiratory variability, suggesting right atrial pressure of 3 mmHg.   Assessment and Plan:  1. DOE (dyspnea on exertion)   2. Carotid artery disease, unspecified laterality, unspecified type ([place])   3. Mixed hyperlipidemia      Medication Adjustments/Labs and Tests Ordered: Current medicines are reviewed at length with the patient today.  Concerns regarding medicines are outlined above.   Disposition: Follow-up with ***  Signed, Levell July, NP 12/18/2020 6:05 PM    [place] at [facility] [place], [place], [place] [address] Phone: [phone]; Fax: [phone]

## 2020-12-19 ENCOUNTER — Ambulatory Visit: Payer: Medicare Other | Admitting: Family Medicine

## 2020-12-19 DIAGNOSIS — R0600 Dyspnea, unspecified: Secondary | ICD-10-CM

## 2020-12-19 DIAGNOSIS — I779 Disorder of arteries and arterioles, unspecified: Secondary | ICD-10-CM

## 2020-12-19 DIAGNOSIS — E782 Mixed hyperlipidemia: Secondary | ICD-10-CM

## 2020-12-26 DIAGNOSIS — R944 Abnormal results of kidney function studies: Secondary | ICD-10-CM | POA: Diagnosis not present

## 2020-12-26 DIAGNOSIS — R0602 Shortness of breath: Secondary | ICD-10-CM | POA: Diagnosis not present

## 2020-12-26 DIAGNOSIS — F331 Major depressive disorder, recurrent, moderate: Secondary | ICD-10-CM | POA: Diagnosis not present

## 2020-12-26 DIAGNOSIS — Z0189 Encounter for other specified special examinations: Secondary | ICD-10-CM | POA: Diagnosis not present

## 2020-12-26 DIAGNOSIS — E782 Mixed hyperlipidemia: Secondary | ICD-10-CM | POA: Diagnosis not present

## 2020-12-26 DIAGNOSIS — E039 Hypothyroidism, unspecified: Secondary | ICD-10-CM | POA: Diagnosis not present

## 2020-12-26 DIAGNOSIS — F419 Anxiety disorder, unspecified: Secondary | ICD-10-CM | POA: Diagnosis not present

## 2020-12-26 DIAGNOSIS — K219 Gastro-esophageal reflux disease without esophagitis: Secondary | ICD-10-CM | POA: Diagnosis not present

## 2020-12-26 DIAGNOSIS — J302 Other seasonal allergic rhinitis: Secondary | ICD-10-CM | POA: Diagnosis not present

## 2020-12-27 DIAGNOSIS — M545 Low back pain, unspecified: Secondary | ICD-10-CM | POA: Diagnosis not present

## 2020-12-27 DIAGNOSIS — Z79899 Other long term (current) drug therapy: Secondary | ICD-10-CM | POA: Diagnosis not present

## 2020-12-27 DIAGNOSIS — G894 Chronic pain syndrome: Secondary | ICD-10-CM | POA: Diagnosis not present

## 2020-12-28 DIAGNOSIS — F419 Anxiety disorder, unspecified: Secondary | ICD-10-CM | POA: Diagnosis not present

## 2020-12-28 DIAGNOSIS — E782 Mixed hyperlipidemia: Secondary | ICD-10-CM | POA: Diagnosis not present

## 2020-12-28 DIAGNOSIS — R0602 Shortness of breath: Secondary | ICD-10-CM | POA: Diagnosis not present

## 2020-12-28 DIAGNOSIS — K219 Gastro-esophageal reflux disease without esophagitis: Secondary | ICD-10-CM | POA: Diagnosis not present

## 2020-12-28 DIAGNOSIS — R944 Abnormal results of kidney function studies: Secondary | ICD-10-CM | POA: Diagnosis not present

## 2020-12-28 DIAGNOSIS — E039 Hypothyroidism, unspecified: Secondary | ICD-10-CM | POA: Diagnosis not present

## 2020-12-28 DIAGNOSIS — N060 Isolated proteinuria with minor glomerular abnormality: Secondary | ICD-10-CM | POA: Diagnosis not present

## 2020-12-28 DIAGNOSIS — M545 Low back pain, unspecified: Secondary | ICD-10-CM | POA: Diagnosis not present

## 2020-12-28 DIAGNOSIS — J302 Other seasonal allergic rhinitis: Secondary | ICD-10-CM | POA: Diagnosis not present

## 2020-12-28 DIAGNOSIS — F331 Major depressive disorder, recurrent, moderate: Secondary | ICD-10-CM | POA: Diagnosis not present

## 2021-01-02 NOTE — Progress Notes (Signed)
Cardiology Office Note  Date: 01/05/2021   ID: [name], [name] November 12, 1941, MRN [id number]  PCP:  Celene Squibb, MD  Cardiologist:  Rozann Lesches, MD Electrophysiologist:  None   Chief Complaint: Cardiac follow up.  Wants to discuss dizzines and fatigue, shortness of breath  History of Present Illness: [name] is a 80 y.o. female with a history of CHF, SOB, carotid artery disease.  Last saw Dr Domenic Polite 07/28/2020: Over the prior 2 years she had been experiencing increasing DOE. Described SOB at times to the point of having to stop and rest. Symptoms more prominent recently. Had a previous lifeline screening with noted carotid artery disease. EKG NSR with PAC's. Recent LDL 129. Echocardiogram, Lexiscan stress, and carotid duplex were ordered.  She is continuing to complain of spontaneous episodes of shortness of breath occurring with and without activities.  She states she is unsure if she is having palpitations.  States when these episodes occur she has to hold onto something due to feeling unsteady.  She states she has had multiple tests and no one can find the cause of the shortness of breath.  She denies any classic anginal symptoms i.e. chest pain, pressure, tightness, radiation to neck, arm, back, jaw.  Denies any nausea, vomiting, diaphoresis.  States she cannot discern any palpitations.  Denies any CVA or TIA-like symptoms, PND, orthopnea, bleeding.  Denies any claudication-like symptoms, DVT or PE-like symptoms, lower extremity edema.   Past Medical History:  Diagnosis Date  . Allergy   . Anxiety   . Arthritis   . Cataract   . CHF (congestive heart failure) ([place])   . Chronic back pain   . Chronic kidney disease   . Chronic sinusitis   . Depression   . Fibromyalgia   . GERD (gastroesophageal reflux disease)   . Glaucoma   . Hypothyroidism   . Macular degeneration   . PONV (postoperative nausea and vomiting)   . Renal insufficiency   . Skin cancer     Past  Surgical History:  Procedure Laterality Date  . ABDOMINAL HYSTERECTOMY     1985  . ABDOMINAL HYSTERECTOMY    . APPENDECTOMY    . back stimulator removed  11/15/2016  . back stimulatory    . BACK SURGERY     plate and screws  . BACK SURGERY     plate in back  . CATARACT EXTRACTION W/PHACO Right 03/22/2016   Procedure: CATARACT EXTRACTION PHACO AND INTRAOCULAR LENS PLACEMENT (IOC);  Surgeon: Tonny Branch, MD;  Location: [facility];  Service: Ophthalmology;  Laterality: Right;  CDE 5.69  . CATARACT EXTRACTION W/PHACO Left 04/19/2016   Procedure: CATARACT EXTRACTION PHACO AND INTRAOCULAR LENS PLACEMENT (IOC);  Surgeon: Tonny Branch, MD;  Location: [facility];  Service: Ophthalmology;  Laterality: Left;  CDE 5.05  . CHOLECYSTECTOMY     2014, non-functioning GB  . COLONOSCOPY N/A 11/11/2015   Procedure: COLONOSCOPY;  Surgeon: Rogene Houston, MD;  [facility];  Service: Endoscopy;  Laterality: N/A;  1:30  . COSMETIC SURGERY    . ESOPHAGOGASTRODUODENOSCOPY N/A 12/19/2016   Procedure: ESOPHAGOGASTRODUODENOSCOPY (EGD);  Surgeon: Rogene Houston, MD;  [facility];  Service: Endoscopy;  Laterality: N/A;  2:25  . EYE SURGERY    . LAPAROSCOPIC APPENDECTOMY N/A 10/30/2017   Procedure: APPENDECTOMY LAPAROSCOPIC;  Surgeon: Jovita Kussmaul, MD;  Location: [place];  Service: General;  Laterality: N/A;  . LAPAROSCOPIC REMOVAL OF MESENTERIC MASS N/A 10/30/2017  Procedure: LAPAROSCOPIC RESECTION OF MASS SMALL BOWEL MESENTRY;  Surgeon: Griselda Miner, MD;  Location[facility] OR;  Service: General;  Laterality: N/A;  . LAPAROSCOPIC SMALL BOWEL RESECTION N/A 10/30/2017   Procedure: LAPAROSCOPIC ASSISTED SMALL BOWEL RESECTION;  Surgeon: Griselda Miner, MD;  Location: [facility] OR;  Service: General;  Laterality: N/A;  . SMALL INTESTINE SURGERY    . TONSILLECTOMY      Current Outpatient Medications  Medication Sig Dispense Refill  . Azelastine-Fluticasone 137-50 MCG/ACT SUSP Place 2 sprays into both nostrils daily  as needed (for allergies).    . cetirizine (ZYRTEC) 10 MG tablet Take 1 tablet by mouth at bedtime.    Marland Kitchen estradiol (CLIMARA - DOSED IN MG/24 HR) 0.025 mg/24hr patch Place 0.025 mg onto the skin every Monday.   6  . famotidine (PEPCID) 20 MG tablet Take 20 mg by mouth 2 (two) times daily.    Marland Kitchen FLUoxetine (PROZAC) 20 MG tablet Take 20 mg by mouth daily.    Marland Kitchen HYDROcodone-acetaminophen (NORCO/VICODIN) 5-325 MG per tablet Take 1 tablet by mouth 4 (four) times daily as needed for moderate pain.     Marland Kitchen levothyroxine (SYNTHROID) 75 MCG tablet Take 1 tablet (75 mcg total) by mouth daily. 90 tablet 3  . OVER THE COUNTER MEDICATION Take 2 capsules by mouth daily. Premiere Formula Ocular Nutrition (Presivision)     No current facility-administered medications for this visit.   Allergies:  Doxycycline   Social History: The patient  reports that she has never smoked. She has never used smokeless tobacco. She reports current alcohol use. She reports that she does not use drugs.   Family History: The patient's family history includes Allergies in her daughter and mother; Heart disease in her mother; Hodgkin's lymphoma in her father; Hyperlipidemia in her mother; Liver cancer in her sister.   ROS:  Please see the history of present illness. Otherwise, complete review of systems is positive for none.  All other systems are reviewed and negative.   Physical Exam: VS:  BP 122/78   Pulse 66   Ht 5\' 3"  (1.6 m)   Wt 128 lb 6.4 oz (58.2 kg)   SpO2 98%   BMI 22.75 kg/m , BMI Body mass index is 22.75 kg/m.  Wt Readings from Last 3 Encounters:  01/05/21 128 lb 6.4 oz (58.2 kg)  10/07/20 130 lb 12.8 oz (59.3 kg)  09/05/20 130 lb 9.6 oz (59.2 kg)    General: Patient appears comfortable at rest. Neck: Supple, no elevated JVP or carotid bruits, no thyromegaly. Lungs: Clear to auscultation, nonlabored breathing at rest. Cardiac: Regular rate and rhythm, no S3 or significant systolic murmur, no pericardial  rub. Extremities: No pitting edema, distal pulses 2+. Skin: Warm and dry. Musculoskeletal: No kyphosis. Neuropsychiatric: Alert and oriented x3, affect grossly appropriate.  ECG:  EKG none 01/16/2020 sinus rhythm with premature supraventricular complexes rate of 74.  Minimal voltage criteria for LVH.  Recent Labwork: 09/07/2020: ALT 12; AST 17; BUN 24; Creatinine, Ser 1.11; Hemoglobin 11.9; Platelets 288; Potassium 5.1; Sodium 135     Component Value Date/Time   CHOL 185 07/11/2018 1005   TRIG 114 07/11/2018 1005   HDL 60 07/11/2018 1005   CHOLHDL 3.1 07/11/2018 1005   LDLCALC 104 07/11/2018 1005    Other Studies Reviewed Today:  CPX 10/31/2020 Interpretation   Notes: Patient gave a very good effort. Pulse-oximetry remained 94-97% for the duration of exercise. Exercise began in stage 3 of ModNaughton protocol to meet patient's  fitness level (see staged-time down data attached).   ECG: Resting ECG in normal sinus rhythm. HR response appropriate. There were no sustained arrhythmias or ST-T changes. BP response appropriate.   PFT: Pre-exercise spirometry was within mostly-normal limits. The MVV was normal.   CPX: Exercise Capacity- Exercise testing with gas exchange demonstrates a normal (excellent) peak VO2 of 23.3 ml/kg/min (126% of the age/gender/weight matched sedentary norms). The RER of 1.05 indicates a near maximal effort.   Cardiovascular response- The O2pulse (a surrogate for stroke volume) increased with incremental exercise, reaching peak at 10 ml/beat (125% predicted).   Ventilatory response- The VE/VCO2 slope moderately elevated and indicates Increased dead space ventilation. The oxygen uptake efficiency slope (OUES) is normal. The VO2 at the ventilatory threshold was normal at 96% of the predicted peak VO2. At peak exercise, the ventilation reached 77% of the measured MVV and breathing reserve was 18 indicating ventilatory reserve nearly depleted. PETCO2 was  low at  27 mmHg during exercise.    Conclusion: Exercise testing with gas exchange demonstrates excellent functional capacity when compared to matched sedentary norms. There is no cardiopulmonary abnormality noted. At peak exercise, patient is ventilatory limited as patient is nearly depleting breathing reserve. Elevated VE/VCO2 slope with low PETCO2 are most likely related to hyperventilation, particularly with low breathing reserve.  Of note, patient did not experience one of the "episodes" she describes in her complaint. There is evidence of deconditioning and patient would likely improve with routine cardiovascular exercise routine.   Carotid artery duplex 08/04/2020 Left/Right Carotid: The extracranial vessels were near-normal with only minimal wall thickening or plaque. Vertebrals: Bilateral vertebral arteries demonstrate antegrade flow. Subclavians: Normal flow hemodynamics were seen in bilateral subclavian arteries.   Lexiscan stress test 08/02/2020  No diagnostic ST segment changes to indicate ischemia.  No significant myocardial perfusion defects to indicate scar or ischemia. There is mild breast attenuation artifact.  Nuclear stress EF: 28%.  This is a high risk study based on calculated LVEF, although perfusion imaging is normal. Suggest correlation with echocardiography to confirm.   Echocardiogram 08/04/2020 1. Left ventricular ejection fraction, by estimation, is 60 to 65%. The left ventricle has normal function. The left ventricle has no regional wall motion abnormalities. Left ventricular diastolic parameters are indeterminate. Normal global longitudinal strain of -21.2%. 2. Right ventricular systolic function is normal. The right ventricular size is normal. There is normal pulmonary artery systolic pressure. The estimated right ventricular systolic pressure is 58.0 mmHg. 3. The mitral valve is grossly normal. Trivial mitral valve regurgitation. 4. The aortic valve is tricuspid.  Aortic valve regurgitation is mild. 5. The inferior vena cava is normal in size with greater than 50% respiratory variability, suggesting right atrial pressure of 3 mmHg.   Assessment and Plan:  1. Dizziness   2. Fatigue, unspecified type   3. SOB (shortness of breath)   4. Carotid artery disease, unspecified laterality, unspecified type ([place])   5. DOE (dyspnea on exertion)   6. Anginal equivalent ([place])    1. Dizziness Reporting recent dizziness associated with episodes of spontaneous episodes of shortness of breath occurring with and without activity.  She states when the shortness of breath episodes occur she has to hold onto something due to feeling unsteady on her feet.  She is unsure if she is having palpitations.  Please get a 14-day ZIO monitor to assess for arrhythmias.  2. Fatigue, unspecified type Complaining of increased fatigue with associated shortness of breath episodes.   3. SOB (shortness of  breath) Has continuing complaints of episodes of spontaneous shortness of breath without activity and worsening shortness of breath with activity.  She states so far no one has been able to find the cause.  She had a Lexiscan stress test September 2021 which showed no areas of ischemia.  It was a high risk study based on LV ejection fraction of 28%.  She had a subsequent echocardiogram 08/04/2020 with EF of 60 to 65%.  No WMA's, trivial mitral regurgitation, mild aortic regurgitation.  She had subsequent CPX 10/31/2020 which demonstrated excellent functional capacity when compared matched sedentary norms.  No cardiopulmonary abnormality.  There was evidence of deconditioning and patient would likely improve with routine cardiovascular exercise routine.  She continues to have these episodes.  Daughter, who is an Therapist, sports, told nursing staff she would like mother to have a diagnostic cardiac catheterization. I  discussed the fact that we could possibly do a coronary CT scan with calcium scoring and FFR  without performing a cardiac catheterization to further examine the coronary anatomy. Patient and daughter would like to proceed. I will discuss with Dr. Domenic Polite and get his input regarding coronary CT given recent normal Lexiscan stress test and cardiopulmonary treadmill stress test.  4. Carotid artery disease, unspecified laterality, unspecified type ([place]) Carotid artery duplex 08/04/2020 carotids were near normal with only minimal wall thickening or plaque.  Vertebrals demonstrated antegrade flow bilaterally.  Normal flow dynamics in the subclavian's bilaterally  Medication Adjustments/Labs and Tests Ordered: Current medicines are reviewed at length with the patient today.  Concerns regarding medicines are outlined above.   Disposition: Follow-up with Dr. Domenic Polite or APP 6 to 8 weeks.  Signed, Levell July, NP 01/05/2021 2:54 PM    [facility] at [place], [place], [place] [address] Phone: [phone]; Fax: [phone]

## 2021-01-05 ENCOUNTER — Ambulatory Visit (INDEPENDENT_AMBULATORY_CARE_PROVIDER_SITE_OTHER): Payer: Medicare Other | Admitting: Family Medicine

## 2021-01-05 ENCOUNTER — Encounter: Payer: Self-pay | Admitting: Family Medicine

## 2021-01-05 ENCOUNTER — Encounter: Payer: Self-pay | Admitting: *Deleted

## 2021-01-05 VITALS — BP 122/78 | HR 66 | Ht 63.0 in | Wt 128.4 lb

## 2021-01-05 DIAGNOSIS — I208 Other forms of angina pectoris: Secondary | ICD-10-CM | POA: Diagnosis not present

## 2021-01-05 DIAGNOSIS — I779 Disorder of arteries and arterioles, unspecified: Secondary | ICD-10-CM

## 2021-01-05 DIAGNOSIS — R42 Dizziness and giddiness: Secondary | ICD-10-CM | POA: Diagnosis not present

## 2021-01-05 DIAGNOSIS — R0600 Dyspnea, unspecified: Secondary | ICD-10-CM

## 2021-01-05 DIAGNOSIS — R0602 Shortness of breath: Secondary | ICD-10-CM

## 2021-01-05 DIAGNOSIS — R5383 Other fatigue: Secondary | ICD-10-CM | POA: Diagnosis not present

## 2021-01-05 DIAGNOSIS — R0609 Other forms of dyspnea: Secondary | ICD-10-CM

## 2021-01-05 MED ORDER — METOPROLOL TARTRATE 50 MG PO TABS: 50.0000 mg | ORAL_TABLET | Freq: Once | ORAL | 0 refills | Status: AC

## 2021-01-05 NOTE — Patient Instructions (Signed)
Medication Instructions:  Continue all current medications.  Labwork:  BMET - order given today.  Please do a few days prior to CT at [facility].   Testing/Procedures:  Coronary CTA   Your physician has recommended that you wear a 14 day event monitor. Event monitors are medical devices that record the heart's electrical activity. Doctors most often [place] these monitors to diagnose arrhythmias. Arrhythmias are problems with the speed or rhythm of the heartbeat. The monitor is a small, portable device. You can wear one while you do your normal daily activities. This is usually used to diagnose what is causing palpitations/syncope (passing out).  Follow-Up:  Office will contact with results via phone or letter.    6-8 weeks   Any Other Special Instructions Will Be Listed Below (If Applicable).  If you need a refill on your cardiac medications before your next appointment, please call your pharmacy.

## 2021-01-06 ENCOUNTER — Telehealth: Payer: Self-pay | Admitting: Family Medicine

## 2021-01-06 ENCOUNTER — Encounter: Payer: Self-pay | Admitting: Family Medicine

## 2021-01-06 NOTE — Telephone Encounter (Signed)
Patient called to add 2 medications to her list.   1. Farxiga 5 mg  2. Atorvastation 10 mg

## 2021-01-06 NOTE — Telephone Encounter (Signed)
Verified how the patient is taking these medications and added to her chart.

## 2021-01-06 NOTE — Telephone Encounter (Signed)
Pt is returning call.  

## 2021-01-06 NOTE — Telephone Encounter (Signed)
Left message for patient to return call.

## 2021-01-09 ENCOUNTER — Ambulatory Visit (INDEPENDENT_AMBULATORY_CARE_PROVIDER_SITE_OTHER): Payer: Medicare Other

## 2021-01-09 ENCOUNTER — Other Ambulatory Visit: Payer: Self-pay | Admitting: Cardiology

## 2021-01-09 DIAGNOSIS — R42 Dizziness and giddiness: Secondary | ICD-10-CM

## 2021-01-12 ENCOUNTER — Other Ambulatory Visit: Payer: Self-pay

## 2021-01-12 ENCOUNTER — Telehealth: Payer: Self-pay | Admitting: Cardiology

## 2021-01-12 ENCOUNTER — Telehealth: Payer: Self-pay | Admitting: *Deleted

## 2021-01-12 ENCOUNTER — Other Ambulatory Visit (HOSPITAL_COMMUNITY)
Admission: RE | Admit: 2021-01-12 | Discharge: 2021-01-12 | Disposition: A | Discharge status: Home / Self Care | Payer: Medicare Other | Source: Ambulatory Visit | Attending: Family Medicine | Admitting: Family Medicine

## 2021-01-12 DIAGNOSIS — R5383 Other fatigue: Secondary | ICD-10-CM

## 2021-01-12 DIAGNOSIS — R0602 Shortness of breath: Secondary | ICD-10-CM | POA: Diagnosis not present

## 2021-01-12 DIAGNOSIS — R42 Dizziness and giddiness: Secondary | ICD-10-CM | POA: Diagnosis not present

## 2021-01-12 LAB — BASIC METABOLIC PANEL
Anion gap: 6 (ref 5–15)
BUN: 23 mg/dL (ref 8–23)
CO2: 26 mmol/L (ref 22–32)
Calcium: 8.9 mg/dL (ref 8.9–10.3)
Chloride: 104 mmol/L (ref 98–111)
Creatinine, Ser: 1.16 mg/dL — ABNORMAL HIGH (ref 0.44–1.00)
GFR, Estimated: 49 mL/min — ABNORMAL LOW (ref >60)
Glucose, Bld: 90 mg/dL (ref 70–99)
Potassium: 4.9 mmol/L (ref 3.5–5.1)
Sodium: 136 mmol/L (ref 135–145)

## 2021-01-12 NOTE — Telephone Encounter (Signed)
Laurine Blazer, LPN  03/14/6221 9:79 PM EST Back to Top     Notified, copy to pcp.

## 2021-01-12 NOTE — Telephone Encounter (Signed)
Patient would like a copy of her lab work sent to her.

## 2021-01-12 NOTE — Telephone Encounter (Signed)
Pt aware that lab results were available on Mychart

## 2021-01-12 NOTE — Telephone Encounter (Signed)
-----   Message from Verta Ellen., NP sent at 01/12/2021  1:35 PM EST ----- Electrolytes look good.  Creatinine is 1.16 and GFR is 49.  This is relatively unchanged from 4 months ago and October when creatinine was 1.11 and GFR was 48.

## 2021-01-14 DIAGNOSIS — R42 Dizziness and giddiness: Secondary | ICD-10-CM

## 2021-01-16 ENCOUNTER — Telehealth (HOSPITAL_COMMUNITY): Payer: Self-pay | Admitting: Emergency Medicine

## 2021-01-16 NOTE — Telephone Encounter (Signed)
Calling to review CCTA Instructions however patient reports she placed her zio patch on Saturday 2/19 and is to wear it for 2 wks.   Pt now scheduled for CCTA on 3/7 at 3:15p.  Marchia Bond RN Navigator Cardiac Imaging [facility] [phone] Office  [phone] Cell

## 2021-01-18 ENCOUNTER — Ambulatory Visit (HOSPITAL_COMMUNITY): Payer: Medicare Other

## 2021-01-20 DIAGNOSIS — R944 Abnormal results of kidney function studies: Secondary | ICD-10-CM | POA: Diagnosis not present

## 2021-01-20 DIAGNOSIS — F331 Major depressive disorder, recurrent, moderate: Secondary | ICD-10-CM | POA: Diagnosis not present

## 2021-01-20 DIAGNOSIS — K219 Gastro-esophageal reflux disease without esophagitis: Secondary | ICD-10-CM | POA: Diagnosis not present

## 2021-01-20 DIAGNOSIS — E039 Hypothyroidism, unspecified: Secondary | ICD-10-CM | POA: Diagnosis not present

## 2021-01-20 DIAGNOSIS — Z0189 Encounter for other specified special examinations: Secondary | ICD-10-CM | POA: Diagnosis not present

## 2021-01-20 DIAGNOSIS — J302 Other seasonal allergic rhinitis: Secondary | ICD-10-CM | POA: Diagnosis not present

## 2021-01-20 DIAGNOSIS — F419 Anxiety disorder, unspecified: Secondary | ICD-10-CM | POA: Diagnosis not present

## 2021-01-20 DIAGNOSIS — E782 Mixed hyperlipidemia: Secondary | ICD-10-CM | POA: Diagnosis not present

## 2021-01-20 DIAGNOSIS — R0602 Shortness of breath: Secondary | ICD-10-CM | POA: Diagnosis not present

## 2021-01-26 ENCOUNTER — Telehealth (HOSPITAL_COMMUNITY): Payer: Self-pay | Admitting: Emergency Medicine

## 2021-01-26 NOTE — Telephone Encounter (Signed)
Attempted to call patient regarding upcoming cardiac CT appointment. °Left message on voicemail with name and callback number °Jacquelyn Antony RN Navigator Cardiac Imaging °[facility] °[phone] Office °[phone] Cell ° °

## 2021-01-27 ENCOUNTER — Telehealth (HOSPITAL_COMMUNITY): Payer: Self-pay | Admitting: Emergency Medicine

## 2021-01-27 NOTE — Telephone Encounter (Signed)
Reaching out to patient to offer assistance regarding upcoming cardiac imaging study; pt verbalizes understanding of appt date/time, parking situation and where to check in, pre-test NPO status and medications ordered, and verified current allergies; name and call back number provided for further questions should they arise Marchia Bond RN Navigator Cardiac Imaging [facility] [phone] office [phone] cell  50mg  metoprolol tartrate 2hr PTA Clarise Cruz

## 2021-01-30 ENCOUNTER — Ambulatory Visit (HOSPITAL_COMMUNITY)
Admission: RE | Admit: 2021-01-30 | Discharge: 2021-01-30 | Disposition: A | Discharge status: Home / Self Care | Payer: Medicare Other | Source: Ambulatory Visit | Attending: Family Medicine | Admitting: Family Medicine

## 2021-01-30 ENCOUNTER — Other Ambulatory Visit: Payer: Self-pay

## 2021-01-30 DIAGNOSIS — R0600 Dyspnea, unspecified: Secondary | ICD-10-CM | POA: Insufficient documentation

## 2021-01-30 DIAGNOSIS — I208 Other forms of angina pectoris: Secondary | ICD-10-CM | POA: Insufficient documentation

## 2021-01-30 DIAGNOSIS — R0609 Other forms of dyspnea: Secondary | ICD-10-CM

## 2021-01-30 DIAGNOSIS — R0602 Shortness of breath: Secondary | ICD-10-CM | POA: Diagnosis not present

## 2021-01-30 MED ORDER — IOHEXOL 350 MG/ML SOLN
80.0000 mL | Freq: Once | INTRAVENOUS | Status: AC | PRN
  Administered 2021-01-30: 80 mL via INTRAVENOUS

## 2021-01-30 MED ORDER — NITROGLYCERIN 0.4 MG SL SUBL
0.8000 mg | SUBLINGUAL_TABLET | Freq: Once | SUBLINGUAL | Status: AC
  Administered 2021-01-30: 0.8 mg via SUBLINGUAL

## 2021-01-30 MED ORDER — NITROGLYCERIN 0.4 MG SL SUBL
SUBLINGUAL_TABLET | SUBLINGUAL | Status: AC
  Filled 2021-01-30: qty 2

## 2021-02-02 DIAGNOSIS — R42 Dizziness and giddiness: Secondary | ICD-10-CM | POA: Diagnosis not present

## 2021-02-03 ENCOUNTER — Telehealth: Payer: Self-pay | Admitting: *Deleted

## 2021-02-03 NOTE — Telephone Encounter (Signed)
Patient informed. Copy sent to PCP °

## 2021-02-03 NOTE — Telephone Encounter (Signed)
-----   Message from Verta Ellen., NP sent at 01/30/2021  4:21 PM EST ----- Please call the patient and let her know the coronary CT showed no evidence of coronary artery disease..  The doctor who read the report mention possibly to consider other causes of dyspnea besides heart disease.

## 2021-02-03 NOTE — Telephone Encounter (Signed)
Pt voiced understanding

## 2021-02-03 NOTE — Telephone Encounter (Signed)
-----   Message from Verta Ellen., NP sent at 02/03/2021 12:44 PM EST ----- Patient's ZIO monitor showed her predominant rhythm was normal sinus with heart rate ranging from 49 up to 115 with the average heart rate of 67.  She had occasional premature atrial contractions which accounted for around 2% of the total beats.  She had occasional rare PVCs.  She had several short episodes of SVT longest lasted 18 beats.  One episode of wide-complex tachycardia lasting only 19 beats.  She had no sustained arrhythmias or pauses.

## 2021-02-06 DIAGNOSIS — M545 Low back pain, unspecified: Secondary | ICD-10-CM | POA: Diagnosis not present

## 2021-02-06 DIAGNOSIS — Z6822 Body mass index (BMI) 22.0-22.9, adult: Secondary | ICD-10-CM | POA: Diagnosis not present

## 2021-02-06 DIAGNOSIS — G894 Chronic pain syndrome: Secondary | ICD-10-CM | POA: Diagnosis not present

## 2021-02-06 DIAGNOSIS — Z79899 Other long term (current) drug therapy: Secondary | ICD-10-CM | POA: Diagnosis not present

## 2021-02-28 DIAGNOSIS — M545 Low back pain, unspecified: Secondary | ICD-10-CM | POA: Diagnosis not present

## 2021-02-28 DIAGNOSIS — Z6822 Body mass index (BMI) 22.0-22.9, adult: Secondary | ICD-10-CM | POA: Diagnosis not present

## 2021-02-28 DIAGNOSIS — Z79899 Other long term (current) drug therapy: Secondary | ICD-10-CM | POA: Diagnosis not present

## 2021-02-28 DIAGNOSIS — G894 Chronic pain syndrome: Secondary | ICD-10-CM | POA: Diagnosis not present

## 2021-03-02 ENCOUNTER — Ambulatory Visit: Payer: Medicare Other | Admitting: Cardiology

## 2021-03-02 ENCOUNTER — Encounter: Payer: Self-pay | Admitting: Cardiology

## 2021-03-02 DIAGNOSIS — Z23 Encounter for immunization: Secondary | ICD-10-CM | POA: Diagnosis not present

## 2021-03-02 NOTE — Progress Notes (Deleted)
Cardiology Office Note  Date: 03/02/2021   ID: Brittany, [name] [birth year] Yang, MRN [id number]  PCP:  Celene Squibb, MD  Cardiologist:  Rozann Lesches, MD Electrophysiologist:  None   No chief complaint on file.   History of Present Illness: [name] is a 80 y.o. female to be very by Mr. Leonides Sake NP.   She was referred for a cardiac monitor by Mr. Leonides Sake NP, results noted below.  She did have occasional PACs representing 2.4% total beats, rare PVCs.  There were several brief episodes of PSVT, some of which conducted with aberrancy.  Work-up for chronic recurring shortness of breath has been reassuring both from a pulmonary and cardiac perspective with testing detailed below.  Recent coronary CTA was normal.   Past Medical History:  Diagnosis Date  . Allergy   . Anxiety   . Arthritis   . Cataract   . Chronic back pain   . Chronic kidney disease   . Chronic sinusitis   . Depression   . Fibromyalgia   . GERD (gastroesophageal reflux disease)   . Glaucoma   . Hypothyroidism   . Macular degeneration   . Renal insufficiency   . Skin cancer     Past Surgical History:  Procedure Laterality Date  . ABDOMINAL HYSTERECTOMY     1985  . ABDOMINAL HYSTERECTOMY    . APPENDECTOMY    . back stimulator removed  11/15/2016  . back stimulatory    . BACK SURGERY     plate and screws  . BACK SURGERY     plate in back  . CATARACT EXTRACTION W/PHACO Right 03/22/2016   Procedure: CATARACT EXTRACTION PHACO AND INTRAOCULAR LENS PLACEMENT (IOC);  Surgeon: Tonny Branch, MD;  [facility]: [facility];  Service: Ophthalmology;  Laterality: Right;  CDE 5.69  . CATARACT EXTRACTION W/PHACO Left 04/19/2016   Procedure: CATARACT EXTRACTION PHACO AND INTRAOCULAR LENS PLACEMENT (IOC);  Surgeon: Tonny Branch, MD;  Location: [facility];  Service: Ophthalmology;  Laterality: Left;  CDE 5.05  . CHOLECYSTECTOMY     2014, non-functioning GB  . COLONOSCOPY N/A 11/11/2015   Procedure: COLONOSCOPY;  Surgeon: Rogene Houston, MD;  [facility];  Service: Endoscopy;  Laterality: N/A;  1:30  . COSMETIC SURGERY    . ESOPHAGOGASTRODUODENOSCOPY N/A 12/19/2016   Procedure: ESOPHAGOGASTRODUODENOSCOPY (EGD);  Surgeon: Rogene Houston, MD;  [facility];  Service: Endoscopy;  Laterality: N/A;  2:25  . EYE SURGERY    . LAPAROSCOPIC APPENDECTOMY N/A 10/30/2017   Procedure: APPENDECTOMY LAPAROSCOPIC;  Surgeon: Jovita Kussmaul, MD;  Location: [place];  Service: General;  Laterality: N/A;  . LAPAROSCOPIC REMOVAL OF MESENTERIC MASS N/A 10/30/2017   Procedure: LAPAROSCOPIC RESECTION OF MASS SMALL BOWEL MESENTRY;  Surgeon: Jovita Kussmaul, MD;  Location: [place];  Service: General;  Laterality: N/A;  . LAPAROSCOPIC SMALL BOWEL RESECTION N/A 10/30/2017   Procedure: LAPAROSCOPIC ASSISTED SMALL BOWEL RESECTION;  Surgeon: Jovita Kussmaul, MD;  Location: [place];  Service: General;  Laterality: N/A;  . SMALL INTESTINE SURGERY    . TONSILLECTOMY      Current Outpatient Medications  Medication Sig Dispense Refill  . atorvastatin (LIPITOR) 10 MG tablet Take 10 mg by mouth daily.    . Azelastine-Fluticasone 137-50 MCG/ACT SUSP Place 2 sprays into both nostrils daily as needed (for allergies).    . cetirizine (ZYRTEC) 10 MG tablet Take 1 tablet by mouth at bedtime.    . dapagliflozin propanediol (FARXIGA)  5 MG TABS tablet Take 5 mg by mouth daily.    Marland Kitchen estradiol (CLIMARA - DOSED IN MG/24 HR) 0.025 mg/24hr patch Place 0.025 mg onto the skin every Monday.   6  . famotidine (PEPCID) 20 MG tablet Take 20 mg by mouth 2 (two) times daily.    Marland Kitchen FLUoxetine (PROZAC) 20 MG tablet Take 20 mg by mouth daily.    Marland Kitchen HYDROcodone-acetaminophen (NORCO/VICODIN) 5-325 MG per tablet Take 1 tablet by mouth 4 (four) times daily as needed for moderate pain.     Marland Kitchen levothyroxine (SYNTHROID) 75 MCG tablet Take 1 tablet (75 mcg total) by mouth daily. 90 tablet 3  . metoprolol tartrate (LOPRESSOR) 50 MG tablet Take 1 tablet (50 mg total) by mouth  once for 1 dose. 1 tablet 0  . OVER THE COUNTER MEDICATION Take 2 capsules by mouth daily. Premiere Formula Ocular Nutrition (Presivision)     No current facility-administered medications for this visit.   Allergies:  Doxycycline   Social History: The patient  reports that she has never smoked. She has never used smokeless tobacco. She reports current alcohol use. She reports that she does not use drugs.   Family History: The patient's family history includes Allergies in her daughter and mother; Heart disease in her mother; Hodgkin's lymphoma in her father; Hyperlipidemia in her mother; Liver cancer in her sister.   ROS:  Please see the history of present illness. Otherwise, complete review of systems is positive for {NONE DEFAULTED:18576::"none"}.  All other systems are reviewed and negative.   Physical Exam: VS:  There were no vitals taken for this visit., BMI There is no height or weight on file to calculate BMI.  Wt Readings from Last 3 Encounters:  01/05/21 128 lb 6.4 oz (58.2 kg)  10/07/20 130 lb 12.8 oz (59.3 kg)  09/05/20 130 lb 9.6 oz (59.2 kg)    General: Patient appears comfortable at rest. HEENT: Conjunctiva and lids normal, oropharynx clear with moist mucosa. Neck: Supple, no elevated JVP or carotid bruits, no thyromegaly. Lungs: Clear to auscultation, nonlabored breathing at rest. Cardiac: Regular rate and rhythm, no S3 or significant systolic murmur, no pericardial rub. Abdomen: Soft, nontender, no hepatomegaly, bowel sounds present, no guarding or rebound. Extremities: No pitting edema, distal pulses 2+. Skin: Warm and dry. Musculoskeletal: No kyphosis. Neuropsychiatric: Alert and oriented x3, affect grossly appropriate.  ECG:  An ECG dated 07/28/2020 was personally reviewed today and demonstrated:  Sinus rhythm with PACs and borderline increased voltage.  Recent Labwork: 09/07/2020: ALT 12; AST 17; Hemoglobin 11.9; Platelets 288 01/12/2021: BUN 23; Creatinine, Ser  1.16; Potassium 4.9; Sodium 136     Component Value Date/Time   CHOL 185 07/11/2018 1005   TRIG 114 07/11/2018 1005   HDL 60 07/11/2018 1005   CHOLHDL 3.1 07/11/2018 1005   LDLCALC 104 07/11/2018 1005    Other Studies Reviewed Today:  Echocardiogram 08/04/2020: 1. Left ventricular ejection fraction, by estimation, is 60 to 65%. The  left ventricle has normal function. The left ventricle has no regional  wall motion abnormalities. Left ventricular diastolic parameters are  indeterminate. Normal global longitudinal  strain of -21.2%.  2. Right ventricular systolic function is normal. The right ventricular  size is normal. There is normal pulmonary artery systolic pressure. The  estimated right ventricular systolic pressure is 16.1 mmHg.  3. The mitral valve is grossly normal. Trivial mitral valve  regurgitation.  4. The aortic valve is tricuspid. Aortic valve regurgitation is mild.  5. The  inferior vena cava is normal in size with greater than 50%  respiratory variability, suggesting right atrial pressure of 3 mmHg.   CPX 10/31/2020: Conclusion: Exercise testing with gas exchange demonstrates excellent functional capacity when compared to matched sedentary norms. There is no cardiopulmonary abnormality noted. At peak exercise, patient is ventilatory limited as patient is nearly depleting breathing reserve. Elevated VE/VCO2 slope with low PETCO2 are most likely related to hyperventilation, particularly with low breathing reserve.  Of note, patient did not experience one of the "episodes" she describes in her complaint. There is evidence of deconditioning and patient would likely improve with routine cardiovascular exercise routine.   Cardiac monitor March 2022: ZIO XT reviewed.  12 days 2 hours analyzed.  Predominant rhythm is sinus with heart rate ranging from 49 bpm up to 115 bpm and average heart rate 67 bpm.  There were occasional PACs representing 2.4% total beats, rare atrial  couplets and triplets were also noted representing less than 1% total beats.  There were also rare PVCs including couplets and triplets representing less than 1% total beats.  Several episodes of brief PSVT were seen, the longest of which was 18 beats.  There was also a single episode of wide-complex tachycardia with right bundle branch block pattern, lasted 19 beats. This represents either aberrantly conducted PSVT or NSVT.  There were no sustained arrhythmias or pauses.  Coronary CTA 01/30/2021: FINDINGS: Quality: Excellent, HR 56  Coronary calcium score: The patient's coronary artery calcium score is 0, which places the patient in the 0 percentile.  Coronary arteries: Normal coronary origins.  Right dominance.  Right Coronary Artery: Dominant.  No stenosis.  Left Main Coronary Artery: Normal. Bifurcates into the LAD and LCx arteries.  Left Anterior Descending Coronary Artery: Normal. Coarses around the apex. Small high D1 branch without stenosis. Moderate sized D2 branch without stenosis.  Left Circumflex Artery: Lateral branch, no stenosis.  Aorta: Normal size, 36 mm at the mid ascending aorta (level of the PA bifurcation) measured double oblique. Aortic atherosclerosis. No dissection.  Aortic Valve: Trileaflet.  No calcifications.  Other findings:  Normal pulmonary vein drainage into the left atrium.  Normal left atrial appendage without a thrombus.  Normal size of the pulmonary artery.  IMPRESSION: 1. No evidence of CAD, CADRADS = 0.  2. Coronary calcium score of 0. This was 0 percentile for age and sex matched control.  3. Normal coronary origin with right dominance.  4. Aortic atherosclerosis  5. Consider non-coronary causes of dyspnea.  Assessment and Plan:    Medication Adjustments/Labs and Tests Ordered: Current medicines are reviewed at length with the patient today.  Concerns regarding medicines are outlined above.   Tests  Ordered: No orders of the defined types were placed in this encounter.   Medication Changes: No orders of the defined types were placed in this encounter.   Disposition:  Follow up {follow up:15908}  Signed, Satira Sark, MD, [facility] 03/02/2021 8:26 AM    [place] [facility] [place], [place], [place] [address] Phone: [phone]; Fax: [phone]

## 2021-04-26 DIAGNOSIS — G894 Chronic pain syndrome: Secondary | ICD-10-CM | POA: Diagnosis not present

## 2021-04-26 DIAGNOSIS — Z79899 Other long term (current) drug therapy: Secondary | ICD-10-CM | POA: Diagnosis not present

## 2021-04-26 DIAGNOSIS — M545 Low back pain, unspecified: Secondary | ICD-10-CM | POA: Diagnosis not present

## 2021-04-26 DIAGNOSIS — Z6822 Body mass index (BMI) 22.0-22.9, adult: Secondary | ICD-10-CM | POA: Diagnosis not present

## 2021-04-28 DIAGNOSIS — E039 Hypothyroidism, unspecified: Secondary | ICD-10-CM | POA: Diagnosis not present

## 2021-04-28 DIAGNOSIS — E782 Mixed hyperlipidemia: Secondary | ICD-10-CM | POA: Diagnosis not present

## 2021-05-02 DIAGNOSIS — F411 Generalized anxiety disorder: Secondary | ICD-10-CM | POA: Diagnosis not present

## 2021-05-02 DIAGNOSIS — R0600 Dyspnea, unspecified: Secondary | ICD-10-CM | POA: Diagnosis not present

## 2021-05-02 DIAGNOSIS — M545 Low back pain, unspecified: Secondary | ICD-10-CM | POA: Diagnosis not present

## 2021-05-02 DIAGNOSIS — J302 Other seasonal allergic rhinitis: Secondary | ICD-10-CM | POA: Diagnosis not present

## 2021-05-02 DIAGNOSIS — E782 Mixed hyperlipidemia: Secondary | ICD-10-CM | POA: Diagnosis not present

## 2021-05-02 DIAGNOSIS — R800 Isolated proteinuria: Secondary | ICD-10-CM | POA: Diagnosis not present

## 2021-05-02 DIAGNOSIS — K219 Gastro-esophageal reflux disease without esophagitis: Secondary | ICD-10-CM | POA: Diagnosis not present

## 2021-05-02 DIAGNOSIS — F329 Major depressive disorder, single episode, unspecified: Secondary | ICD-10-CM | POA: Diagnosis not present

## 2021-05-02 DIAGNOSIS — R944 Abnormal results of kidney function studies: Secondary | ICD-10-CM | POA: Diagnosis not present

## 2021-05-02 DIAGNOSIS — E039 Hypothyroidism, unspecified: Secondary | ICD-10-CM | POA: Diagnosis not present

## 2021-05-08 DIAGNOSIS — L820 Inflamed seborrheic keratosis: Secondary | ICD-10-CM | POA: Diagnosis not present

## 2021-05-08 DIAGNOSIS — C44311 Basal cell carcinoma of skin of nose: Secondary | ICD-10-CM | POA: Diagnosis not present

## 2021-05-26 DIAGNOSIS — Z79899 Other long term (current) drug therapy: Secondary | ICD-10-CM | POA: Diagnosis not present

## 2021-05-26 DIAGNOSIS — Z6822 Body mass index (BMI) 22.0-22.9, adult: Secondary | ICD-10-CM | POA: Diagnosis not present

## 2021-05-26 DIAGNOSIS — M545 Low back pain, unspecified: Secondary | ICD-10-CM | POA: Diagnosis not present

## 2021-05-26 DIAGNOSIS — G894 Chronic pain syndrome: Secondary | ICD-10-CM | POA: Diagnosis not present

## 2021-06-02 DIAGNOSIS — C44311 Basal cell carcinoma of skin of nose: Secondary | ICD-10-CM | POA: Diagnosis not present

## 2021-06-12 DIAGNOSIS — N2581 Secondary hyperparathyroidism of renal origin: Secondary | ICD-10-CM | POA: Diagnosis not present

## 2021-06-12 DIAGNOSIS — R809 Proteinuria, unspecified: Secondary | ICD-10-CM | POA: Diagnosis not present

## 2021-06-12 DIAGNOSIS — D631 Anemia in chronic kidney disease: Secondary | ICD-10-CM | POA: Diagnosis not present

## 2021-06-12 DIAGNOSIS — N1831 Chronic kidney disease, stage 3a: Secondary | ICD-10-CM | POA: Diagnosis not present

## 2021-06-13 ENCOUNTER — Other Ambulatory Visit: Payer: Self-pay | Admitting: Nephrology

## 2021-06-13 ENCOUNTER — Other Ambulatory Visit (HOSPITAL_COMMUNITY): Payer: Self-pay | Admitting: Nephrology

## 2021-06-13 DIAGNOSIS — N1831 Chronic kidney disease, stage 3a: Secondary | ICD-10-CM

## 2021-06-13 DIAGNOSIS — R809 Proteinuria, unspecified: Secondary | ICD-10-CM

## 2021-06-16 DIAGNOSIS — H353213 Exudative age-related macular degeneration, right eye, with inactive scar: Secondary | ICD-10-CM | POA: Diagnosis not present

## 2021-06-16 DIAGNOSIS — H31091 Other chorioretinal scars, right eye: Secondary | ICD-10-CM | POA: Diagnosis not present

## 2021-06-16 DIAGNOSIS — H43813 Vitreous degeneration, bilateral: Secondary | ICD-10-CM | POA: Diagnosis not present

## 2021-06-16 DIAGNOSIS — H353123 Nonexudative age-related macular degeneration, left eye, advanced atrophic without subfoveal involvement: Secondary | ICD-10-CM | POA: Diagnosis not present

## 2021-06-19 ENCOUNTER — Ambulatory Visit (HOSPITAL_COMMUNITY)
Admission: RE | Admit: 2021-06-19 | Discharge: 2021-06-19 | Disposition: A | Discharge status: Home / Self Care | Payer: Medicare Other | Source: Ambulatory Visit | Attending: Nephrology | Admitting: Nephrology

## 2021-06-19 ENCOUNTER — Other Ambulatory Visit: Payer: Self-pay

## 2021-06-19 DIAGNOSIS — R809 Proteinuria, unspecified: Secondary | ICD-10-CM | POA: Diagnosis not present

## 2021-06-19 DIAGNOSIS — N1831 Chronic kidney disease, stage 3a: Secondary | ICD-10-CM | POA: Insufficient documentation

## 2021-06-19 DIAGNOSIS — N183 Chronic kidney disease, stage 3 unspecified: Secondary | ICD-10-CM | POA: Diagnosis not present

## 2021-06-26 DIAGNOSIS — Z6822 Body mass index (BMI) 22.0-22.9, adult: Secondary | ICD-10-CM | POA: Diagnosis not present

## 2021-06-26 DIAGNOSIS — M545 Low back pain, unspecified: Secondary | ICD-10-CM | POA: Diagnosis not present

## 2021-06-26 DIAGNOSIS — G894 Chronic pain syndrome: Secondary | ICD-10-CM | POA: Diagnosis not present

## 2021-06-26 DIAGNOSIS — Z79899 Other long term (current) drug therapy: Secondary | ICD-10-CM | POA: Diagnosis not present

## 2021-07-10 DIAGNOSIS — N1831 Chronic kidney disease, stage 3a: Secondary | ICD-10-CM | POA: Diagnosis not present

## 2021-07-17 DIAGNOSIS — D631 Anemia in chronic kidney disease: Secondary | ICD-10-CM | POA: Diagnosis not present

## 2021-07-17 DIAGNOSIS — R809 Proteinuria, unspecified: Secondary | ICD-10-CM | POA: Diagnosis not present

## 2021-07-17 DIAGNOSIS — N1831 Chronic kidney disease, stage 3a: Secondary | ICD-10-CM | POA: Diagnosis not present

## 2021-07-17 DIAGNOSIS — Z20822 Contact with and (suspected) exposure to covid-19: Secondary | ICD-10-CM | POA: Diagnosis not present

## 2021-07-17 DIAGNOSIS — N2581 Secondary hyperparathyroidism of renal origin: Secondary | ICD-10-CM | POA: Diagnosis not present

## 2021-07-28 DIAGNOSIS — Z79899 Other long term (current) drug therapy: Secondary | ICD-10-CM | POA: Diagnosis not present

## 2021-07-28 DIAGNOSIS — G894 Chronic pain syndrome: Secondary | ICD-10-CM | POA: Diagnosis not present

## 2021-07-28 DIAGNOSIS — Z6822 Body mass index (BMI) 22.0-22.9, adult: Secondary | ICD-10-CM | POA: Diagnosis not present

## 2021-07-28 DIAGNOSIS — M545 Low back pain, unspecified: Secondary | ICD-10-CM | POA: Diagnosis not present

## 2021-08-28 DIAGNOSIS — Z6822 Body mass index (BMI) 22.0-22.9, adult: Secondary | ICD-10-CM | POA: Diagnosis not present

## 2021-08-28 DIAGNOSIS — G894 Chronic pain syndrome: Secondary | ICD-10-CM | POA: Diagnosis not present

## 2021-08-28 DIAGNOSIS — M545 Low back pain, unspecified: Secondary | ICD-10-CM | POA: Diagnosis not present

## 2021-08-28 DIAGNOSIS — Z79899 Other long term (current) drug therapy: Secondary | ICD-10-CM | POA: Diagnosis not present

## 2021-08-29 DIAGNOSIS — C44311 Basal cell carcinoma of skin of nose: Secondary | ICD-10-CM | POA: Diagnosis not present

## 2021-09-01 DIAGNOSIS — E039 Hypothyroidism, unspecified: Secondary | ICD-10-CM | POA: Diagnosis not present

## 2021-09-01 DIAGNOSIS — E782 Mixed hyperlipidemia: Secondary | ICD-10-CM | POA: Diagnosis not present

## 2021-09-05 DIAGNOSIS — E782 Mixed hyperlipidemia: Secondary | ICD-10-CM | POA: Diagnosis not present

## 2021-09-05 DIAGNOSIS — Z23 Encounter for immunization: Secondary | ICD-10-CM | POA: Diagnosis not present

## 2021-09-05 DIAGNOSIS — E039 Hypothyroidism, unspecified: Secondary | ICD-10-CM | POA: Diagnosis not present

## 2021-09-05 DIAGNOSIS — F411 Generalized anxiety disorder: Secondary | ICD-10-CM | POA: Diagnosis not present

## 2021-09-05 DIAGNOSIS — R944 Abnormal results of kidney function studies: Secondary | ICD-10-CM | POA: Diagnosis not present

## 2021-09-05 DIAGNOSIS — F329 Major depressive disorder, single episode, unspecified: Secondary | ICD-10-CM | POA: Diagnosis not present

## 2021-09-05 DIAGNOSIS — R0600 Dyspnea, unspecified: Secondary | ICD-10-CM | POA: Diagnosis not present

## 2021-09-05 DIAGNOSIS — R800 Isolated proteinuria: Secondary | ICD-10-CM | POA: Diagnosis not present

## 2021-09-05 DIAGNOSIS — N1832 Chronic kidney disease, stage 3b: Secondary | ICD-10-CM | POA: Diagnosis not present

## 2021-09-05 DIAGNOSIS — M545 Low back pain, unspecified: Secondary | ICD-10-CM | POA: Diagnosis not present

## 2021-09-05 DIAGNOSIS — K219 Gastro-esophageal reflux disease without esophagitis: Secondary | ICD-10-CM | POA: Diagnosis not present

## 2021-09-05 DIAGNOSIS — J302 Other seasonal allergic rhinitis: Secondary | ICD-10-CM | POA: Diagnosis not present

## 2021-09-26 DIAGNOSIS — M545 Low back pain, unspecified: Secondary | ICD-10-CM | POA: Diagnosis not present

## 2021-09-26 DIAGNOSIS — Z79899 Other long term (current) drug therapy: Secondary | ICD-10-CM | POA: Diagnosis not present

## 2021-09-26 DIAGNOSIS — G894 Chronic pain syndrome: Secondary | ICD-10-CM | POA: Diagnosis not present

## 2021-09-26 DIAGNOSIS — Z6822 Body mass index (BMI) 22.0-22.9, adult: Secondary | ICD-10-CM | POA: Diagnosis not present

## 2021-10-05 DIAGNOSIS — Z20822 Contact with and (suspected) exposure to covid-19: Secondary | ICD-10-CM | POA: Diagnosis not present

## 2021-10-16 DIAGNOSIS — N1831 Chronic kidney disease, stage 3a: Secondary | ICD-10-CM | POA: Diagnosis not present

## 2021-10-16 DIAGNOSIS — D631 Anemia in chronic kidney disease: Secondary | ICD-10-CM | POA: Diagnosis not present

## 2021-10-16 DIAGNOSIS — N2581 Secondary hyperparathyroidism of renal origin: Secondary | ICD-10-CM | POA: Diagnosis not present

## 2021-10-16 DIAGNOSIS — R809 Proteinuria, unspecified: Secondary | ICD-10-CM | POA: Diagnosis not present

## 2021-10-17 DIAGNOSIS — Z20822 Contact with and (suspected) exposure to covid-19: Secondary | ICD-10-CM | POA: Diagnosis not present

## 2021-10-26 DIAGNOSIS — Z0000 Encounter for general adult medical examination without abnormal findings: Secondary | ICD-10-CM | POA: Diagnosis not present

## 2021-10-26 DIAGNOSIS — Z79899 Other long term (current) drug therapy: Secondary | ICD-10-CM | POA: Diagnosis not present

## 2021-10-26 DIAGNOSIS — G894 Chronic pain syndrome: Secondary | ICD-10-CM | POA: Diagnosis not present

## 2021-10-26 DIAGNOSIS — M545 Low back pain, unspecified: Secondary | ICD-10-CM | POA: Diagnosis not present

## 2021-10-26 DIAGNOSIS — Z6822 Body mass index (BMI) 22.0-22.9, adult: Secondary | ICD-10-CM | POA: Diagnosis not present

## 2021-11-09 DIAGNOSIS — N1831 Chronic kidney disease, stage 3a: Secondary | ICD-10-CM | POA: Diagnosis not present

## 2021-11-23 DIAGNOSIS — Z23 Encounter for immunization: Secondary | ICD-10-CM | POA: Diagnosis not present

## 2021-11-28 DIAGNOSIS — M545 Low back pain, unspecified: Secondary | ICD-10-CM | POA: Diagnosis not present

## 2021-11-28 DIAGNOSIS — Z6822 Body mass index (BMI) 22.0-22.9, adult: Secondary | ICD-10-CM | POA: Diagnosis not present

## 2021-11-28 DIAGNOSIS — G894 Chronic pain syndrome: Secondary | ICD-10-CM | POA: Diagnosis not present

## 2021-11-28 DIAGNOSIS — Z79899 Other long term (current) drug therapy: Secondary | ICD-10-CM | POA: Diagnosis not present

## 2021-12-27 DIAGNOSIS — H353122 Nonexudative age-related macular degeneration, left eye, intermediate dry stage: Secondary | ICD-10-CM | POA: Diagnosis not present

## 2021-12-27 DIAGNOSIS — H524 Presbyopia: Secondary | ICD-10-CM | POA: Diagnosis not present

## 2021-12-27 DIAGNOSIS — Z961 Presence of intraocular lens: Secondary | ICD-10-CM | POA: Diagnosis not present

## 2021-12-27 DIAGNOSIS — H353212 Exudative age-related macular degeneration, right eye, with inactive choroidal neovascularization: Secondary | ICD-10-CM | POA: Diagnosis not present

## 2021-12-29 DIAGNOSIS — Z6822 Body mass index (BMI) 22.0-22.9, adult: Secondary | ICD-10-CM | POA: Diagnosis not present

## 2021-12-29 DIAGNOSIS — Z79899 Other long term (current) drug therapy: Secondary | ICD-10-CM | POA: Diagnosis not present

## 2021-12-29 DIAGNOSIS — M545 Low back pain, unspecified: Secondary | ICD-10-CM | POA: Diagnosis not present

## 2021-12-29 DIAGNOSIS — G894 Chronic pain syndrome: Secondary | ICD-10-CM | POA: Diagnosis not present

## 2022-01-08 DIAGNOSIS — Z1283 Encounter for screening for malignant neoplasm of skin: Secondary | ICD-10-CM | POA: Diagnosis not present

## 2022-01-08 DIAGNOSIS — C44729 Squamous cell carcinoma of skin of left lower limb, including hip: Secondary | ICD-10-CM | POA: Diagnosis not present

## 2022-01-08 DIAGNOSIS — Z85828 Personal history of other malignant neoplasm of skin: Secondary | ICD-10-CM | POA: Diagnosis not present

## 2022-01-08 DIAGNOSIS — D225 Melanocytic nevi of trunk: Secondary | ICD-10-CM | POA: Diagnosis not present

## 2022-01-08 DIAGNOSIS — Z20822 Contact with and (suspected) exposure to covid-19: Secondary | ICD-10-CM | POA: Diagnosis not present

## 2022-01-08 DIAGNOSIS — B078 Other viral warts: Secondary | ICD-10-CM | POA: Diagnosis not present

## 2022-01-08 DIAGNOSIS — Z08 Encounter for follow-up examination after completed treatment for malignant neoplasm: Secondary | ICD-10-CM | POA: Diagnosis not present

## 2022-01-09 DIAGNOSIS — H31091 Other chorioretinal scars, right eye: Secondary | ICD-10-CM | POA: Diagnosis not present

## 2022-01-09 DIAGNOSIS — H353123 Nonexudative age-related macular degeneration, left eye, advanced atrophic without subfoveal involvement: Secondary | ICD-10-CM | POA: Diagnosis not present

## 2022-01-09 DIAGNOSIS — H43813 Vitreous degeneration, bilateral: Secondary | ICD-10-CM | POA: Diagnosis not present

## 2022-01-09 DIAGNOSIS — H353213 Exudative age-related macular degeneration, right eye, with inactive scar: Secondary | ICD-10-CM | POA: Diagnosis not present

## 2022-01-26 DIAGNOSIS — Z6821 Body mass index (BMI) 21.0-21.9, adult: Secondary | ICD-10-CM | POA: Diagnosis not present

## 2022-01-26 DIAGNOSIS — Z79899 Other long term (current) drug therapy: Secondary | ICD-10-CM | POA: Diagnosis not present

## 2022-01-26 DIAGNOSIS — G894 Chronic pain syndrome: Secondary | ICD-10-CM | POA: Diagnosis not present

## 2022-01-26 DIAGNOSIS — M545 Low back pain, unspecified: Secondary | ICD-10-CM | POA: Diagnosis not present

## 2022-01-29 DIAGNOSIS — N390 Urinary tract infection, site not specified: Secondary | ICD-10-CM | POA: Diagnosis not present

## 2022-01-29 DIAGNOSIS — N1831 Chronic kidney disease, stage 3a: Secondary | ICD-10-CM | POA: Diagnosis not present

## 2022-02-05 DIAGNOSIS — R809 Proteinuria, unspecified: Secondary | ICD-10-CM | POA: Diagnosis not present

## 2022-02-05 DIAGNOSIS — E875 Hyperkalemia: Secondary | ICD-10-CM | POA: Diagnosis not present

## 2022-02-05 DIAGNOSIS — D631 Anemia in chronic kidney disease: Secondary | ICD-10-CM | POA: Diagnosis not present

## 2022-02-05 DIAGNOSIS — N2581 Secondary hyperparathyroidism of renal origin: Secondary | ICD-10-CM | POA: Diagnosis not present

## 2022-02-05 DIAGNOSIS — N1831 Chronic kidney disease, stage 3a: Secondary | ICD-10-CM | POA: Diagnosis not present

## 2022-02-06 DIAGNOSIS — D485 Neoplasm of uncertain behavior of skin: Secondary | ICD-10-CM | POA: Diagnosis not present

## 2022-02-06 DIAGNOSIS — D225 Melanocytic nevi of trunk: Secondary | ICD-10-CM | POA: Diagnosis not present

## 2022-02-06 DIAGNOSIS — L820 Inflamed seborrheic keratosis: Secondary | ICD-10-CM | POA: Diagnosis not present

## 2022-02-06 DIAGNOSIS — Z85828 Personal history of other malignant neoplasm of skin: Secondary | ICD-10-CM | POA: Diagnosis not present

## 2022-02-06 DIAGNOSIS — Z08 Encounter for follow-up examination after completed treatment for malignant neoplasm: Secondary | ICD-10-CM | POA: Diagnosis not present

## 2022-02-06 DIAGNOSIS — Z20828 Contact with and (suspected) exposure to other viral communicable diseases: Secondary | ICD-10-CM | POA: Diagnosis not present

## 2022-02-06 DIAGNOSIS — D2272 Melanocytic nevi of left lower limb, including hip: Secondary | ICD-10-CM | POA: Diagnosis not present

## 2022-02-10 DIAGNOSIS — Z20822 Contact with and (suspected) exposure to covid-19: Secondary | ICD-10-CM | POA: Diagnosis not present

## 2022-02-26 DIAGNOSIS — Z79899 Other long term (current) drug therapy: Secondary | ICD-10-CM | POA: Diagnosis not present

## 2022-02-26 DIAGNOSIS — Z6822 Body mass index (BMI) 22.0-22.9, adult: Secondary | ICD-10-CM | POA: Diagnosis not present

## 2022-02-26 DIAGNOSIS — G894 Chronic pain syndrome: Secondary | ICD-10-CM | POA: Diagnosis not present

## 2022-02-26 DIAGNOSIS — M545 Low back pain, unspecified: Secondary | ICD-10-CM | POA: Diagnosis not present

## 2022-02-28 DIAGNOSIS — Z20822 Contact with and (suspected) exposure to covid-19: Secondary | ICD-10-CM | POA: Diagnosis not present

## 2022-03-05 DIAGNOSIS — E782 Mixed hyperlipidemia: Secondary | ICD-10-CM | POA: Diagnosis not present

## 2022-03-05 DIAGNOSIS — Z20822 Contact with and (suspected) exposure to covid-19: Secondary | ICD-10-CM | POA: Diagnosis not present

## 2022-03-06 DIAGNOSIS — Z20822 Contact with and (suspected) exposure to covid-19: Secondary | ICD-10-CM | POA: Diagnosis not present

## 2022-03-07 DIAGNOSIS — K219 Gastro-esophageal reflux disease without esophagitis: Secondary | ICD-10-CM | POA: Diagnosis not present

## 2022-03-07 DIAGNOSIS — R800 Isolated proteinuria: Secondary | ICD-10-CM | POA: Diagnosis not present

## 2022-03-07 DIAGNOSIS — Z85828 Personal history of other malignant neoplasm of skin: Secondary | ICD-10-CM | POA: Diagnosis not present

## 2022-03-07 DIAGNOSIS — E039 Hypothyroidism, unspecified: Secondary | ICD-10-CM | POA: Diagnosis not present

## 2022-03-07 DIAGNOSIS — R944 Abnormal results of kidney function studies: Secondary | ICD-10-CM | POA: Diagnosis not present

## 2022-03-07 DIAGNOSIS — E782 Mixed hyperlipidemia: Secondary | ICD-10-CM | POA: Diagnosis not present

## 2022-03-07 DIAGNOSIS — J302 Other seasonal allergic rhinitis: Secondary | ICD-10-CM | POA: Diagnosis not present

## 2022-03-07 DIAGNOSIS — R778 Other specified abnormalities of plasma proteins: Secondary | ICD-10-CM | POA: Diagnosis not present

## 2022-03-07 DIAGNOSIS — M545 Low back pain, unspecified: Secondary | ICD-10-CM | POA: Diagnosis not present

## 2022-03-07 DIAGNOSIS — N1832 Chronic kidney disease, stage 3b: Secondary | ICD-10-CM | POA: Diagnosis not present

## 2022-03-07 DIAGNOSIS — F5104 Psychophysiologic insomnia: Secondary | ICD-10-CM | POA: Diagnosis not present

## 2022-03-09 DIAGNOSIS — Z20828 Contact with and (suspected) exposure to other viral communicable diseases: Secondary | ICD-10-CM | POA: Diagnosis not present

## 2022-03-26 DIAGNOSIS — Z20822 Contact with and (suspected) exposure to covid-19: Secondary | ICD-10-CM | POA: Diagnosis not present

## 2022-03-27 DIAGNOSIS — Z79899 Other long term (current) drug therapy: Secondary | ICD-10-CM | POA: Diagnosis not present

## 2022-03-27 DIAGNOSIS — Z6822 Body mass index (BMI) 22.0-22.9, adult: Secondary | ICD-10-CM | POA: Diagnosis not present

## 2022-03-27 DIAGNOSIS — M545 Low back pain, unspecified: Secondary | ICD-10-CM | POA: Diagnosis not present

## 2022-03-27 DIAGNOSIS — G894 Chronic pain syndrome: Secondary | ICD-10-CM | POA: Diagnosis not present

## 2022-03-30 DIAGNOSIS — Z20822 Contact with and (suspected) exposure to covid-19: Secondary | ICD-10-CM | POA: Diagnosis not present

## 2022-04-01 DIAGNOSIS — Z20822 Contact with and (suspected) exposure to covid-19: Secondary | ICD-10-CM | POA: Diagnosis not present

## 2022-04-05 DIAGNOSIS — Z20822 Contact with and (suspected) exposure to covid-19: Secondary | ICD-10-CM | POA: Diagnosis not present

## 2022-04-27 DIAGNOSIS — G894 Chronic pain syndrome: Secondary | ICD-10-CM | POA: Diagnosis not present

## 2022-04-27 DIAGNOSIS — M545 Low back pain, unspecified: Secondary | ICD-10-CM | POA: Diagnosis not present

## 2022-04-27 DIAGNOSIS — Z79899 Other long term (current) drug therapy: Secondary | ICD-10-CM | POA: Diagnosis not present

## 2022-04-27 DIAGNOSIS — Z6821 Body mass index (BMI) 21.0-21.9, adult: Secondary | ICD-10-CM | POA: Diagnosis not present

## 2022-05-28 DIAGNOSIS — M545 Low back pain, unspecified: Secondary | ICD-10-CM | POA: Diagnosis not present

## 2022-05-28 DIAGNOSIS — Z79899 Other long term (current) drug therapy: Secondary | ICD-10-CM | POA: Diagnosis not present

## 2022-05-28 DIAGNOSIS — Z6821 Body mass index (BMI) 21.0-21.9, adult: Secondary | ICD-10-CM | POA: Diagnosis not present

## 2022-05-28 DIAGNOSIS — G894 Chronic pain syndrome: Secondary | ICD-10-CM | POA: Diagnosis not present

## 2022-06-06 IMAGING — DX DG CHEST 2V
2 series · 2 of 2 positions shown · non-contrast
Comparison: Chest x-ray 06/16/2020, CT chest 12/15/2014.

CLINICAL DATA: Dyspnea, shortness of breath X 2 years worsening
recently. No known history of COVID. Does have a history of
congestive heart failure. Nonsmoker.

EXAM:
CHEST - 2 VIEW

[chest pa]
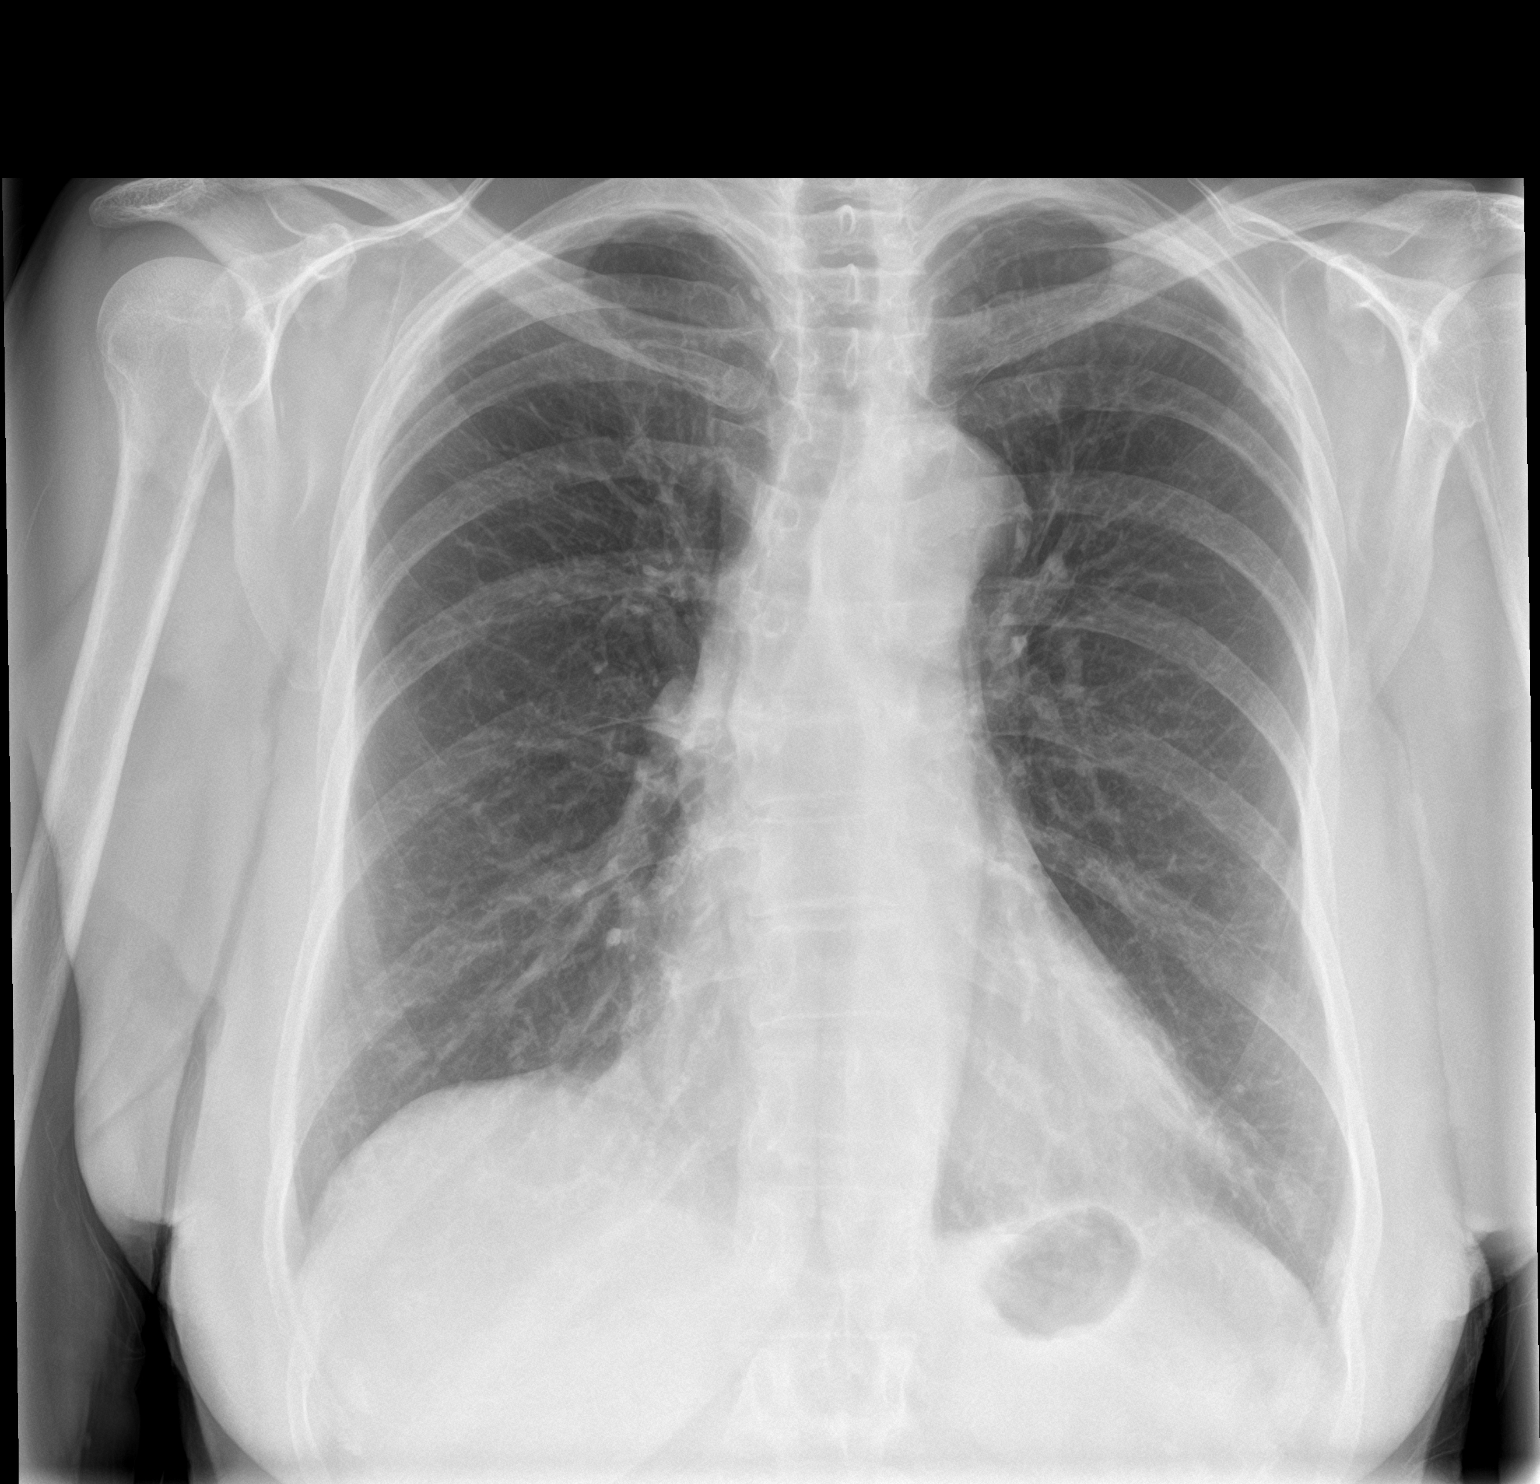

[chest lat]
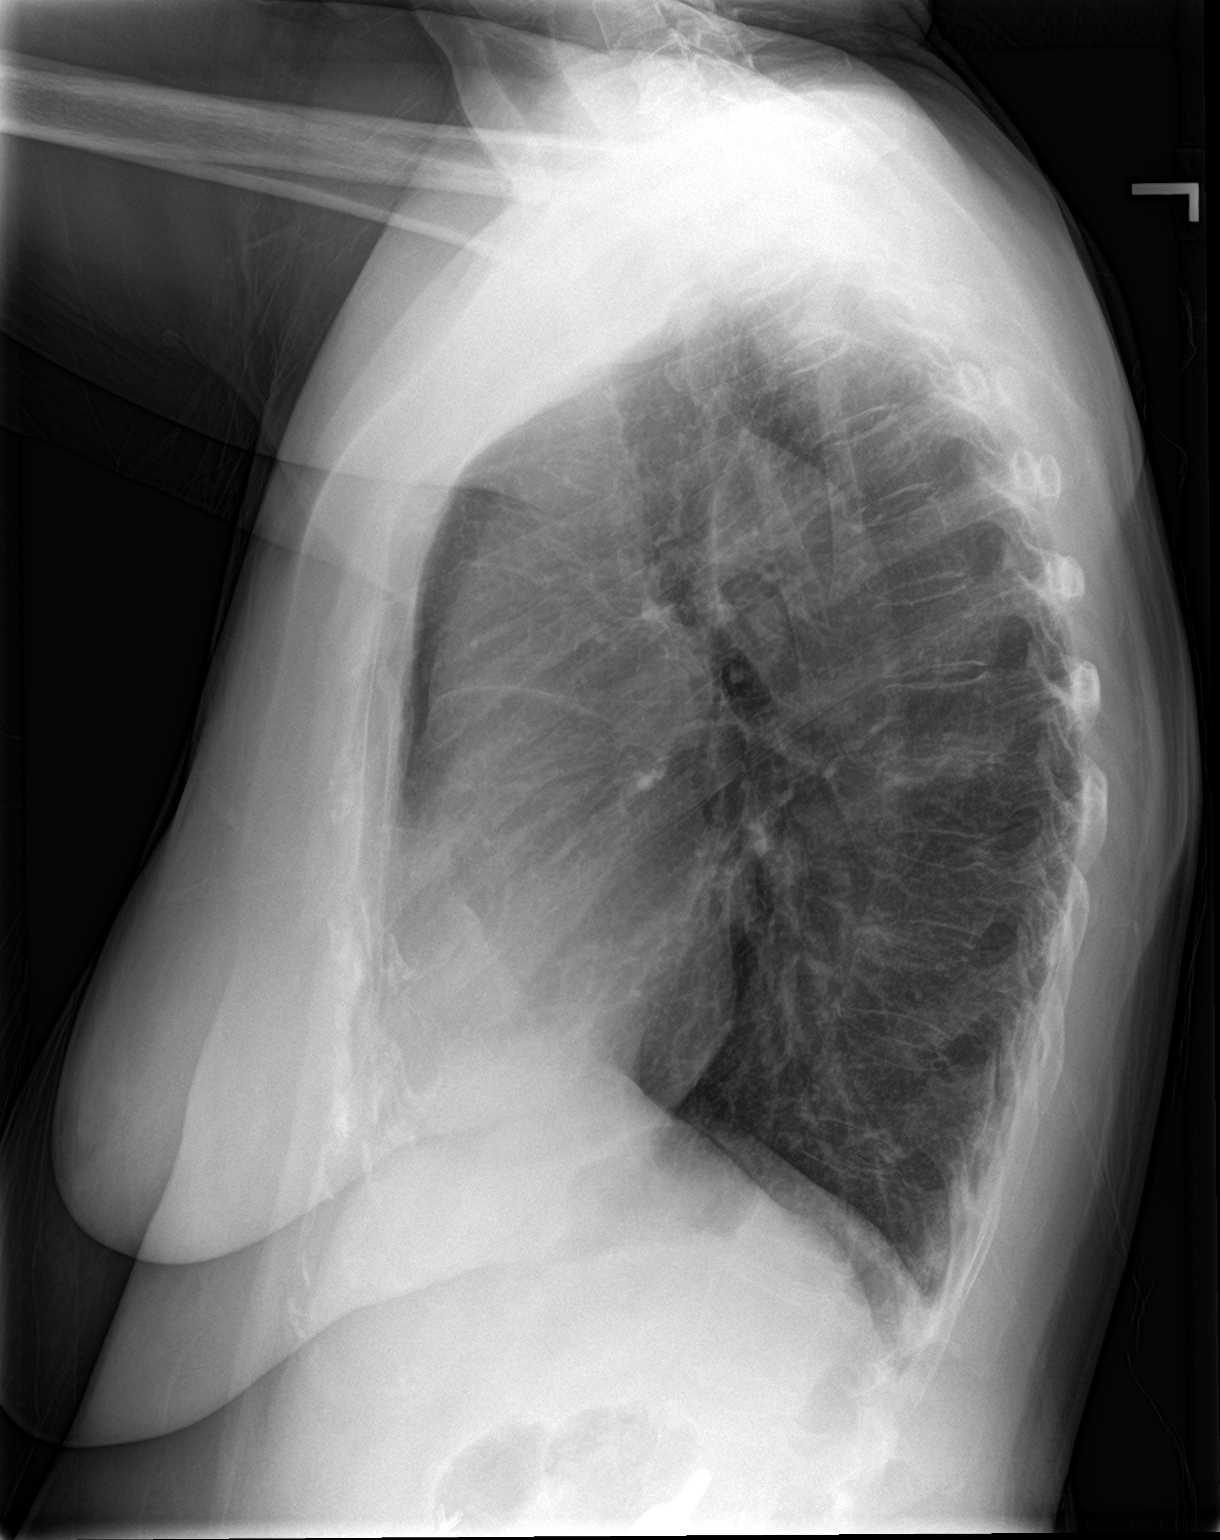

[2 of 2 positions shown; findings below may reference images not displayed]

FINDINGS: The heart size and mediastinal contours are within normal limits.
Aortic arch calcifications.

Biapical pleural/pulmonary scarring. No focal consolidation. No
pulmonary edema. No pleural effusion. No pneumothorax.

Diffusely decreased bone density. No acute osseous abnormality.
Surgical clips and possible surgical hardware of the spine overlie
the partially visualized abdomen.
IMPRESSION: No active cardiopulmonary disease.

## 2022-06-26 DIAGNOSIS — M545 Low back pain, unspecified: Secondary | ICD-10-CM | POA: Diagnosis not present

## 2022-06-26 DIAGNOSIS — Z6821 Body mass index (BMI) 21.0-21.9, adult: Secondary | ICD-10-CM | POA: Diagnosis not present

## 2022-06-26 DIAGNOSIS — G894 Chronic pain syndrome: Secondary | ICD-10-CM | POA: Diagnosis not present

## 2022-06-26 DIAGNOSIS — Z79899 Other long term (current) drug therapy: Secondary | ICD-10-CM | POA: Diagnosis not present

## 2022-07-10 DIAGNOSIS — H43813 Vitreous degeneration, bilateral: Secondary | ICD-10-CM | POA: Diagnosis not present

## 2022-07-10 DIAGNOSIS — H353211 Exudative age-related macular degeneration, right eye, with active choroidal neovascularization: Secondary | ICD-10-CM | POA: Diagnosis not present

## 2022-07-10 DIAGNOSIS — H353123 Nonexudative age-related macular degeneration, left eye, advanced atrophic without subfoveal involvement: Secondary | ICD-10-CM | POA: Diagnosis not present

## 2022-07-10 DIAGNOSIS — H353114 Nonexudative age-related macular degeneration, right eye, advanced atrophic with subfoveal involvement: Secondary | ICD-10-CM | POA: Diagnosis not present

## 2022-07-10 DIAGNOSIS — H31091 Other chorioretinal scars, right eye: Secondary | ICD-10-CM | POA: Diagnosis not present

## 2022-07-25 DIAGNOSIS — H353114 Nonexudative age-related macular degeneration, right eye, advanced atrophic with subfoveal involvement: Secondary | ICD-10-CM | POA: Diagnosis not present

## 2022-07-27 DIAGNOSIS — Z6821 Body mass index (BMI) 21.0-21.9, adult: Secondary | ICD-10-CM | POA: Diagnosis not present

## 2022-07-27 DIAGNOSIS — Z79899 Other long term (current) drug therapy: Secondary | ICD-10-CM | POA: Diagnosis not present

## 2022-07-27 DIAGNOSIS — M545 Low back pain, unspecified: Secondary | ICD-10-CM | POA: Diagnosis not present

## 2022-07-27 DIAGNOSIS — G894 Chronic pain syndrome: Secondary | ICD-10-CM | POA: Diagnosis not present

## 2022-08-06 DIAGNOSIS — L239 Allergic contact dermatitis, unspecified cause: Secondary | ICD-10-CM | POA: Diagnosis not present

## 2022-08-20 DIAGNOSIS — E875 Hyperkalemia: Secondary | ICD-10-CM | POA: Diagnosis not present

## 2022-08-20 DIAGNOSIS — R809 Proteinuria, unspecified: Secondary | ICD-10-CM | POA: Diagnosis not present

## 2022-08-20 DIAGNOSIS — N189 Chronic kidney disease, unspecified: Secondary | ICD-10-CM | POA: Diagnosis not present

## 2022-08-20 DIAGNOSIS — D631 Anemia in chronic kidney disease: Secondary | ICD-10-CM | POA: Diagnosis not present

## 2022-08-20 DIAGNOSIS — N1831 Chronic kidney disease, stage 3a: Secondary | ICD-10-CM | POA: Diagnosis not present

## 2022-08-20 DIAGNOSIS — N2581 Secondary hyperparathyroidism of renal origin: Secondary | ICD-10-CM | POA: Diagnosis not present

## 2022-08-27 DIAGNOSIS — M545 Low back pain, unspecified: Secondary | ICD-10-CM | POA: Diagnosis not present

## 2022-08-27 DIAGNOSIS — Z6821 Body mass index (BMI) 21.0-21.9, adult: Secondary | ICD-10-CM | POA: Diagnosis not present

## 2022-08-27 DIAGNOSIS — M5136 Other intervertebral disc degeneration, lumbar region: Secondary | ICD-10-CM | POA: Diagnosis not present

## 2022-08-27 DIAGNOSIS — Z79899 Other long term (current) drug therapy: Secondary | ICD-10-CM | POA: Diagnosis not present

## 2022-08-30 DIAGNOSIS — E782 Mixed hyperlipidemia: Secondary | ICD-10-CM | POA: Diagnosis not present

## 2022-08-30 DIAGNOSIS — E039 Hypothyroidism, unspecified: Secondary | ICD-10-CM | POA: Diagnosis not present

## 2022-09-06 DIAGNOSIS — K219 Gastro-esophageal reflux disease without esophagitis: Secondary | ICD-10-CM | POA: Diagnosis not present

## 2022-09-06 DIAGNOSIS — Z79890 Hormone replacement therapy (postmenopausal): Secondary | ICD-10-CM | POA: Diagnosis not present

## 2022-09-06 DIAGNOSIS — N1832 Chronic kidney disease, stage 3b: Secondary | ICD-10-CM | POA: Diagnosis not present

## 2022-09-06 DIAGNOSIS — E039 Hypothyroidism, unspecified: Secondary | ICD-10-CM | POA: Diagnosis not present

## 2022-09-06 DIAGNOSIS — R800 Isolated proteinuria: Secondary | ICD-10-CM | POA: Diagnosis not present

## 2022-09-06 DIAGNOSIS — R0600 Dyspnea, unspecified: Secondary | ICD-10-CM | POA: Diagnosis not present

## 2022-09-06 DIAGNOSIS — J302 Other seasonal allergic rhinitis: Secondary | ICD-10-CM | POA: Diagnosis not present

## 2022-09-06 DIAGNOSIS — Z23 Encounter for immunization: Secondary | ICD-10-CM | POA: Diagnosis not present

## 2022-09-06 DIAGNOSIS — M545 Low back pain, unspecified: Secondary | ICD-10-CM | POA: Diagnosis not present

## 2022-09-06 DIAGNOSIS — E782 Mixed hyperlipidemia: Secondary | ICD-10-CM | POA: Diagnosis not present

## 2022-09-06 DIAGNOSIS — R778 Other specified abnormalities of plasma proteins: Secondary | ICD-10-CM | POA: Diagnosis not present

## 2022-09-06 DIAGNOSIS — F329 Major depressive disorder, single episode, unspecified: Secondary | ICD-10-CM | POA: Diagnosis not present

## 2022-09-19 DIAGNOSIS — H353211 Exudative age-related macular degeneration, right eye, with active choroidal neovascularization: Secondary | ICD-10-CM | POA: Diagnosis not present

## 2022-09-19 DIAGNOSIS — H31091 Other chorioretinal scars, right eye: Secondary | ICD-10-CM | POA: Diagnosis not present

## 2022-09-19 DIAGNOSIS — H353114 Nonexudative age-related macular degeneration, right eye, advanced atrophic with subfoveal involvement: Secondary | ICD-10-CM | POA: Diagnosis not present

## 2022-09-19 DIAGNOSIS — H353123 Nonexudative age-related macular degeneration, left eye, advanced atrophic without subfoveal involvement: Secondary | ICD-10-CM | POA: Diagnosis not present

## 2022-09-19 DIAGNOSIS — H43813 Vitreous degeneration, bilateral: Secondary | ICD-10-CM | POA: Diagnosis not present

## 2022-09-25 DIAGNOSIS — Z6821 Body mass index (BMI) 21.0-21.9, adult: Secondary | ICD-10-CM | POA: Diagnosis not present

## 2022-09-25 DIAGNOSIS — M545 Low back pain, unspecified: Secondary | ICD-10-CM | POA: Diagnosis not present

## 2022-09-25 DIAGNOSIS — Z79899 Other long term (current) drug therapy: Secondary | ICD-10-CM | POA: Diagnosis not present

## 2022-09-25 DIAGNOSIS — G894 Chronic pain syndrome: Secondary | ICD-10-CM | POA: Diagnosis not present

## 2022-09-25 DIAGNOSIS — M5136 Other intervertebral disc degeneration, lumbar region: Secondary | ICD-10-CM | POA: Diagnosis not present

## 2022-10-15 DIAGNOSIS — L820 Inflamed seborrheic keratosis: Secondary | ICD-10-CM | POA: Diagnosis not present

## 2022-10-24 DIAGNOSIS — H353210 Exudative age-related macular degeneration, right eye, stage unspecified: Secondary | ICD-10-CM | POA: Diagnosis not present

## 2022-10-25 DIAGNOSIS — G894 Chronic pain syndrome: Secondary | ICD-10-CM | POA: Diagnosis not present

## 2022-10-25 DIAGNOSIS — Z6822 Body mass index (BMI) 22.0-22.9, adult: Secondary | ICD-10-CM | POA: Diagnosis not present

## 2022-10-25 DIAGNOSIS — M545 Low back pain, unspecified: Secondary | ICD-10-CM | POA: Diagnosis not present

## 2022-10-25 DIAGNOSIS — M5136 Other intervertebral disc degeneration, lumbar region: Secondary | ICD-10-CM | POA: Diagnosis not present

## 2022-10-25 DIAGNOSIS — Z79899 Other long term (current) drug therapy: Secondary | ICD-10-CM | POA: Diagnosis not present

## 2022-10-29 DIAGNOSIS — Z23 Encounter for immunization: Secondary | ICD-10-CM | POA: Diagnosis not present

## 2022-11-08 DIAGNOSIS — M797 Fibromyalgia: Secondary | ICD-10-CM | POA: Diagnosis not present

## 2022-11-21 DIAGNOSIS — H353123 Nonexudative age-related macular degeneration, left eye, advanced atrophic without subfoveal involvement: Secondary | ICD-10-CM | POA: Diagnosis not present

## 2022-11-23 DIAGNOSIS — Z79899 Other long term (current) drug therapy: Secondary | ICD-10-CM | POA: Diagnosis not present

## 2022-11-23 DIAGNOSIS — G894 Chronic pain syndrome: Secondary | ICD-10-CM | POA: Diagnosis not present

## 2022-11-23 DIAGNOSIS — M5136 Other intervertebral disc degeneration, lumbar region: Secondary | ICD-10-CM | POA: Diagnosis not present

## 2022-11-23 DIAGNOSIS — M545 Low back pain, unspecified: Secondary | ICD-10-CM | POA: Diagnosis not present

## 2022-11-23 DIAGNOSIS — Z6822 Body mass index (BMI) 22.0-22.9, adult: Secondary | ICD-10-CM | POA: Diagnosis not present

## 2022-12-11 DIAGNOSIS — M797 Fibromyalgia: Secondary | ICD-10-CM | POA: Diagnosis not present

## 2022-12-19 DIAGNOSIS — H353211 Exudative age-related macular degeneration, right eye, with active choroidal neovascularization: Secondary | ICD-10-CM | POA: Diagnosis not present

## 2022-12-25 DIAGNOSIS — Z6822 Body mass index (BMI) 22.0-22.9, adult: Secondary | ICD-10-CM | POA: Diagnosis not present

## 2022-12-25 DIAGNOSIS — M5136 Other intervertebral disc degeneration, lumbar region: Secondary | ICD-10-CM | POA: Diagnosis not present

## 2022-12-25 DIAGNOSIS — Z79899 Other long term (current) drug therapy: Secondary | ICD-10-CM | POA: Diagnosis not present

## 2022-12-25 DIAGNOSIS — G894 Chronic pain syndrome: Secondary | ICD-10-CM | POA: Diagnosis not present

## 2022-12-25 DIAGNOSIS — M545 Low back pain, unspecified: Secondary | ICD-10-CM | POA: Diagnosis not present

## 2023-01-16 DIAGNOSIS — H43813 Vitreous degeneration, bilateral: Secondary | ICD-10-CM | POA: Diagnosis not present

## 2023-01-16 DIAGNOSIS — H353114 Nonexudative age-related macular degeneration, right eye, advanced atrophic with subfoveal involvement: Secondary | ICD-10-CM | POA: Diagnosis not present

## 2023-01-16 DIAGNOSIS — H31091 Other chorioretinal scars, right eye: Secondary | ICD-10-CM | POA: Diagnosis not present

## 2023-01-16 DIAGNOSIS — H353123 Nonexudative age-related macular degeneration, left eye, advanced atrophic without subfoveal involvement: Secondary | ICD-10-CM | POA: Diagnosis not present

## 2023-01-16 DIAGNOSIS — H353211 Exudative age-related macular degeneration, right eye, with active choroidal neovascularization: Secondary | ICD-10-CM | POA: Diagnosis not present

## 2023-01-21 DIAGNOSIS — M545 Low back pain, unspecified: Secondary | ICD-10-CM | POA: Diagnosis not present

## 2023-01-21 DIAGNOSIS — Z79899 Other long term (current) drug therapy: Secondary | ICD-10-CM | POA: Diagnosis not present

## 2023-01-21 DIAGNOSIS — Z6822 Body mass index (BMI) 22.0-22.9, adult: Secondary | ICD-10-CM | POA: Diagnosis not present

## 2023-01-21 DIAGNOSIS — M5136 Other intervertebral disc degeneration, lumbar region: Secondary | ICD-10-CM | POA: Diagnosis not present

## 2023-01-21 DIAGNOSIS — G894 Chronic pain syndrome: Secondary | ICD-10-CM | POA: Diagnosis not present

## 2023-01-22 DIAGNOSIS — B078 Other viral warts: Secondary | ICD-10-CM | POA: Diagnosis not present

## 2023-01-22 DIAGNOSIS — X32XXXD Exposure to sunlight, subsequent encounter: Secondary | ICD-10-CM | POA: Diagnosis not present

## 2023-01-22 DIAGNOSIS — L570 Actinic keratosis: Secondary | ICD-10-CM | POA: Diagnosis not present

## 2023-01-22 DIAGNOSIS — L820 Inflamed seborrheic keratosis: Secondary | ICD-10-CM | POA: Diagnosis not present

## 2023-01-30 DIAGNOSIS — H353122 Nonexudative age-related macular degeneration, left eye, intermediate dry stage: Secondary | ICD-10-CM | POA: Diagnosis not present

## 2023-01-30 DIAGNOSIS — Z961 Presence of intraocular lens: Secondary | ICD-10-CM | POA: Diagnosis not present

## 2023-01-30 DIAGNOSIS — H353212 Exudative age-related macular degeneration, right eye, with inactive choroidal neovascularization: Secondary | ICD-10-CM | POA: Diagnosis not present

## 2023-02-18 DIAGNOSIS — N1831 Chronic kidney disease, stage 3a: Secondary | ICD-10-CM | POA: Diagnosis not present

## 2023-02-18 DIAGNOSIS — L820 Inflamed seborrheic keratosis: Secondary | ICD-10-CM | POA: Diagnosis not present

## 2023-02-18 DIAGNOSIS — Z1283 Encounter for screening for malignant neoplasm of skin: Secondary | ICD-10-CM | POA: Diagnosis not present

## 2023-02-18 DIAGNOSIS — D2271 Melanocytic nevi of right lower limb, including hip: Secondary | ICD-10-CM | POA: Diagnosis not present

## 2023-02-18 DIAGNOSIS — D485 Neoplasm of uncertain behavior of skin: Secondary | ICD-10-CM | POA: Diagnosis not present

## 2023-02-18 DIAGNOSIS — R809 Proteinuria, unspecified: Secondary | ICD-10-CM | POA: Diagnosis not present

## 2023-02-18 DIAGNOSIS — D225 Melanocytic nevi of trunk: Secondary | ICD-10-CM | POA: Diagnosis not present

## 2023-02-18 DIAGNOSIS — L821 Other seborrheic keratosis: Secondary | ICD-10-CM | POA: Diagnosis not present

## 2023-02-19 DIAGNOSIS — M545 Low back pain, unspecified: Secondary | ICD-10-CM | POA: Diagnosis not present

## 2023-02-19 DIAGNOSIS — M5136 Other intervertebral disc degeneration, lumbar region: Secondary | ICD-10-CM | POA: Diagnosis not present

## 2023-02-19 DIAGNOSIS — Z79899 Other long term (current) drug therapy: Secondary | ICD-10-CM | POA: Diagnosis not present

## 2023-02-19 DIAGNOSIS — Z6822 Body mass index (BMI) 22.0-22.9, adult: Secondary | ICD-10-CM | POA: Diagnosis not present

## 2023-02-19 DIAGNOSIS — G894 Chronic pain syndrome: Secondary | ICD-10-CM | POA: Diagnosis not present

## 2023-02-20 DIAGNOSIS — H353211 Exudative age-related macular degeneration, right eye, with active choroidal neovascularization: Secondary | ICD-10-CM | POA: Diagnosis not present

## 2023-02-20 DIAGNOSIS — H31091 Other chorioretinal scars, right eye: Secondary | ICD-10-CM | POA: Diagnosis not present

## 2023-02-20 DIAGNOSIS — H353123 Nonexudative age-related macular degeneration, left eye, advanced atrophic without subfoveal involvement: Secondary | ICD-10-CM | POA: Diagnosis not present

## 2023-02-20 DIAGNOSIS — H43813 Vitreous degeneration, bilateral: Secondary | ICD-10-CM | POA: Diagnosis not present

## 2023-02-20 DIAGNOSIS — H353114 Nonexudative age-related macular degeneration, right eye, advanced atrophic with subfoveal involvement: Secondary | ICD-10-CM | POA: Diagnosis not present

## 2023-03-14 ENCOUNTER — Other Ambulatory Visit (HOSPITAL_COMMUNITY): Payer: Self-pay | Admitting: Internal Medicine

## 2023-03-14 DIAGNOSIS — E782 Mixed hyperlipidemia: Secondary | ICD-10-CM | POA: Diagnosis not present

## 2023-03-14 DIAGNOSIS — F5104 Psychophysiologic insomnia: Secondary | ICD-10-CM | POA: Diagnosis not present

## 2023-03-14 DIAGNOSIS — J302 Other seasonal allergic rhinitis: Secondary | ICD-10-CM | POA: Diagnosis not present

## 2023-03-14 DIAGNOSIS — E039 Hypothyroidism, unspecified: Secondary | ICD-10-CM | POA: Diagnosis not present

## 2023-03-14 DIAGNOSIS — Z0000 Encounter for general adult medical examination without abnormal findings: Secondary | ICD-10-CM | POA: Diagnosis not present

## 2023-03-14 DIAGNOSIS — Z13820 Encounter for screening for osteoporosis: Secondary | ICD-10-CM

## 2023-03-14 DIAGNOSIS — R778 Other specified abnormalities of plasma proteins: Secondary | ICD-10-CM | POA: Diagnosis not present

## 2023-03-14 DIAGNOSIS — N1832 Chronic kidney disease, stage 3b: Secondary | ICD-10-CM | POA: Diagnosis not present

## 2023-03-14 DIAGNOSIS — F329 Major depressive disorder, single episode, unspecified: Secondary | ICD-10-CM | POA: Diagnosis not present

## 2023-03-14 DIAGNOSIS — M545 Low back pain, unspecified: Secondary | ICD-10-CM | POA: Diagnosis not present

## 2023-03-14 DIAGNOSIS — G8929 Other chronic pain: Secondary | ICD-10-CM | POA: Diagnosis not present

## 2023-03-14 DIAGNOSIS — K219 Gastro-esophageal reflux disease without esophagitis: Secondary | ICD-10-CM | POA: Diagnosis not present

## 2023-03-22 DIAGNOSIS — Z79899 Other long term (current) drug therapy: Secondary | ICD-10-CM | POA: Diagnosis not present

## 2023-03-22 DIAGNOSIS — M5136 Other intervertebral disc degeneration, lumbar region: Secondary | ICD-10-CM | POA: Diagnosis not present

## 2023-03-22 DIAGNOSIS — M545 Low back pain, unspecified: Secondary | ICD-10-CM | POA: Diagnosis not present

## 2023-03-22 DIAGNOSIS — G894 Chronic pain syndrome: Secondary | ICD-10-CM | POA: Diagnosis not present

## 2023-03-22 DIAGNOSIS — Z6821 Body mass index (BMI) 21.0-21.9, adult: Secondary | ICD-10-CM | POA: Diagnosis not present

## 2023-03-25 DIAGNOSIS — H353211 Exudative age-related macular degeneration, right eye, with active choroidal neovascularization: Secondary | ICD-10-CM | POA: Diagnosis not present

## 2023-04-19 DIAGNOSIS — G894 Chronic pain syndrome: Secondary | ICD-10-CM | POA: Diagnosis not present

## 2023-04-19 DIAGNOSIS — M545 Low back pain, unspecified: Secondary | ICD-10-CM | POA: Diagnosis not present

## 2023-04-19 DIAGNOSIS — Z6823 Body mass index (BMI) 23.0-23.9, adult: Secondary | ICD-10-CM | POA: Diagnosis not present

## 2023-04-19 DIAGNOSIS — Z79899 Other long term (current) drug therapy: Secondary | ICD-10-CM | POA: Diagnosis not present

## 2023-04-19 DIAGNOSIS — M5136 Other intervertebral disc degeneration, lumbar region: Secondary | ICD-10-CM | POA: Diagnosis not present

## 2023-04-23 DIAGNOSIS — H43813 Vitreous degeneration, bilateral: Secondary | ICD-10-CM | POA: Diagnosis not present

## 2023-04-23 DIAGNOSIS — H31091 Other chorioretinal scars, right eye: Secondary | ICD-10-CM | POA: Diagnosis not present

## 2023-04-23 DIAGNOSIS — H353211 Exudative age-related macular degeneration, right eye, with active choroidal neovascularization: Secondary | ICD-10-CM | POA: Diagnosis not present

## 2023-06-14 DIAGNOSIS — G894 Chronic pain syndrome: Secondary | ICD-10-CM | POA: Diagnosis not present

## 2023-06-14 DIAGNOSIS — M545 Low back pain, unspecified: Secondary | ICD-10-CM | POA: Diagnosis not present

## 2023-06-14 DIAGNOSIS — M5136 Other intervertebral disc degeneration, lumbar region: Secondary | ICD-10-CM | POA: Diagnosis not present

## 2023-06-14 DIAGNOSIS — Z6822 Body mass index (BMI) 22.0-22.9, adult: Secondary | ICD-10-CM | POA: Diagnosis not present

## 2023-06-14 DIAGNOSIS — R030 Elevated blood-pressure reading, without diagnosis of hypertension: Secondary | ICD-10-CM | POA: Diagnosis not present

## 2023-06-14 DIAGNOSIS — Z79899 Other long term (current) drug therapy: Secondary | ICD-10-CM | POA: Diagnosis not present

## 2023-06-18 DIAGNOSIS — H353123 Nonexudative age-related macular degeneration, left eye, advanced atrophic without subfoveal involvement: Secondary | ICD-10-CM | POA: Diagnosis not present

## 2023-06-18 DIAGNOSIS — H353213 Exudative age-related macular degeneration, right eye, with inactive scar: Secondary | ICD-10-CM | POA: Diagnosis not present

## 2023-06-18 DIAGNOSIS — H31091 Other chorioretinal scars, right eye: Secondary | ICD-10-CM | POA: Diagnosis not present

## 2023-06-18 DIAGNOSIS — H43813 Vitreous degeneration, bilateral: Secondary | ICD-10-CM | POA: Diagnosis not present

## 2023-07-15 DIAGNOSIS — Z79899 Other long term (current) drug therapy: Secondary | ICD-10-CM | POA: Diagnosis not present

## 2023-07-15 DIAGNOSIS — G894 Chronic pain syndrome: Secondary | ICD-10-CM | POA: Diagnosis not present

## 2023-07-15 DIAGNOSIS — Z6822 Body mass index (BMI) 22.0-22.9, adult: Secondary | ICD-10-CM | POA: Diagnosis not present

## 2023-07-15 DIAGNOSIS — M545 Low back pain, unspecified: Secondary | ICD-10-CM | POA: Diagnosis not present

## 2023-07-15 DIAGNOSIS — M5136 Other intervertebral disc degeneration, lumbar region: Secondary | ICD-10-CM | POA: Diagnosis not present

## 2023-07-15 DIAGNOSIS — R030 Elevated blood-pressure reading, without diagnosis of hypertension: Secondary | ICD-10-CM | POA: Diagnosis not present

## 2023-07-19 DIAGNOSIS — Z79899 Other long term (current) drug therapy: Secondary | ICD-10-CM | POA: Diagnosis not present

## 2023-07-19 DIAGNOSIS — G8929 Other chronic pain: Secondary | ICD-10-CM | POA: Diagnosis not present

## 2023-07-19 DIAGNOSIS — M545 Low back pain, unspecified: Secondary | ICD-10-CM | POA: Diagnosis not present

## 2023-08-27 DIAGNOSIS — H353213 Exudative age-related macular degeneration, right eye, with inactive scar: Secondary | ICD-10-CM | POA: Diagnosis not present

## 2023-08-27 DIAGNOSIS — H31091 Other chorioretinal scars, right eye: Secondary | ICD-10-CM | POA: Diagnosis not present

## 2023-08-27 DIAGNOSIS — H353123 Nonexudative age-related macular degeneration, left eye, advanced atrophic without subfoveal involvement: Secondary | ICD-10-CM | POA: Diagnosis not present

## 2023-08-27 DIAGNOSIS — H43813 Vitreous degeneration, bilateral: Secondary | ICD-10-CM | POA: Diagnosis not present

## 2023-09-12 DIAGNOSIS — E782 Mixed hyperlipidemia: Secondary | ICD-10-CM | POA: Diagnosis not present

## 2023-09-12 DIAGNOSIS — E039 Hypothyroidism, unspecified: Secondary | ICD-10-CM | POA: Diagnosis not present

## 2023-09-12 DIAGNOSIS — R7301 Impaired fasting glucose: Secondary | ICD-10-CM | POA: Diagnosis not present

## 2023-09-18 ENCOUNTER — Other Ambulatory Visit (HOSPITAL_COMMUNITY): Payer: Self-pay | Admitting: Family Medicine

## 2023-09-18 DIAGNOSIS — K219 Gastro-esophageal reflux disease without esophagitis: Secondary | ICD-10-CM | POA: Diagnosis not present

## 2023-09-18 DIAGNOSIS — Z13820 Encounter for screening for osteoporosis: Secondary | ICD-10-CM

## 2023-09-18 DIAGNOSIS — J302 Other seasonal allergic rhinitis: Secondary | ICD-10-CM | POA: Diagnosis not present

## 2023-09-18 DIAGNOSIS — Z79890 Hormone replacement therapy (postmenopausal): Secondary | ICD-10-CM | POA: Diagnosis not present

## 2023-09-18 DIAGNOSIS — N1832 Chronic kidney disease, stage 3b: Secondary | ICD-10-CM | POA: Diagnosis not present

## 2023-09-18 DIAGNOSIS — G8929 Other chronic pain: Secondary | ICD-10-CM | POA: Diagnosis not present

## 2023-09-18 DIAGNOSIS — M545 Low back pain, unspecified: Secondary | ICD-10-CM | POA: Diagnosis not present

## 2023-09-18 DIAGNOSIS — F329 Major depressive disorder, single episode, unspecified: Secondary | ICD-10-CM | POA: Diagnosis not present

## 2023-09-18 DIAGNOSIS — R800 Isolated proteinuria: Secondary | ICD-10-CM | POA: Diagnosis not present

## 2023-09-18 DIAGNOSIS — E039 Hypothyroidism, unspecified: Secondary | ICD-10-CM | POA: Diagnosis not present

## 2023-09-18 DIAGNOSIS — R778 Other specified abnormalities of plasma proteins: Secondary | ICD-10-CM | POA: Diagnosis not present

## 2023-09-18 DIAGNOSIS — Z23 Encounter for immunization: Secondary | ICD-10-CM | POA: Diagnosis not present

## 2023-09-18 DIAGNOSIS — E782 Mixed hyperlipidemia: Secondary | ICD-10-CM | POA: Diagnosis not present

## 2023-11-05 DIAGNOSIS — H353123 Nonexudative age-related macular degeneration, left eye, advanced atrophic without subfoveal involvement: Secondary | ICD-10-CM | POA: Diagnosis not present

## 2023-11-05 DIAGNOSIS — H353213 Exudative age-related macular degeneration, right eye, with inactive scar: Secondary | ICD-10-CM | POA: Diagnosis not present

## 2023-11-05 DIAGNOSIS — H31091 Other chorioretinal scars, right eye: Secondary | ICD-10-CM | POA: Diagnosis not present

## 2023-11-05 DIAGNOSIS — H43813 Vitreous degeneration, bilateral: Secondary | ICD-10-CM | POA: Diagnosis not present

## 2023-11-26 DIAGNOSIS — Z23 Encounter for immunization: Secondary | ICD-10-CM | POA: Diagnosis not present

## 2023-12-25 DIAGNOSIS — C44622 Squamous cell carcinoma of skin of right upper limb, including shoulder: Secondary | ICD-10-CM | POA: Diagnosis not present

## 2023-12-25 DIAGNOSIS — B078 Other viral warts: Secondary | ICD-10-CM | POA: Diagnosis not present

## 2024-01-08 DIAGNOSIS — H31091 Other chorioretinal scars, right eye: Secondary | ICD-10-CM | POA: Diagnosis not present

## 2024-01-08 DIAGNOSIS — H353123 Nonexudative age-related macular degeneration, left eye, advanced atrophic without subfoveal involvement: Secondary | ICD-10-CM | POA: Diagnosis not present

## 2024-01-08 DIAGNOSIS — H353213 Exudative age-related macular degeneration, right eye, with inactive scar: Secondary | ICD-10-CM | POA: Diagnosis not present

## 2024-01-08 DIAGNOSIS — H43813 Vitreous degeneration, bilateral: Secondary | ICD-10-CM | POA: Diagnosis not present

## 2024-02-18 DIAGNOSIS — N1831 Chronic kidney disease, stage 3a: Secondary | ICD-10-CM | POA: Diagnosis not present

## 2024-02-24 DIAGNOSIS — E875 Hyperkalemia: Secondary | ICD-10-CM | POA: Diagnosis not present

## 2024-02-24 DIAGNOSIS — D631 Anemia in chronic kidney disease: Secondary | ICD-10-CM | POA: Diagnosis not present

## 2024-02-24 DIAGNOSIS — N1831 Chronic kidney disease, stage 3a: Secondary | ICD-10-CM | POA: Diagnosis not present

## 2024-02-24 DIAGNOSIS — R809 Proteinuria, unspecified: Secondary | ICD-10-CM | POA: Diagnosis not present

## 2024-02-24 DIAGNOSIS — N2581 Secondary hyperparathyroidism of renal origin: Secondary | ICD-10-CM | POA: Diagnosis not present

## 2024-03-16 DIAGNOSIS — E782 Mixed hyperlipidemia: Secondary | ICD-10-CM | POA: Diagnosis not present

## 2024-03-16 DIAGNOSIS — E039 Hypothyroidism, unspecified: Secondary | ICD-10-CM | POA: Diagnosis not present

## 2024-03-16 DIAGNOSIS — R7301 Impaired fasting glucose: Secondary | ICD-10-CM | POA: Diagnosis not present

## 2024-03-18 DIAGNOSIS — F5104 Psychophysiologic insomnia: Secondary | ICD-10-CM | POA: Diagnosis not present

## 2024-03-18 DIAGNOSIS — E039 Hypothyroidism, unspecified: Secondary | ICD-10-CM | POA: Diagnosis not present

## 2024-03-18 DIAGNOSIS — Z79890 Hormone replacement therapy (postmenopausal): Secondary | ICD-10-CM | POA: Diagnosis not present

## 2024-03-18 DIAGNOSIS — K219 Gastro-esophageal reflux disease without esophagitis: Secondary | ICD-10-CM | POA: Diagnosis not present

## 2024-03-18 DIAGNOSIS — R778 Other specified abnormalities of plasma proteins: Secondary | ICD-10-CM | POA: Diagnosis not present

## 2024-03-18 DIAGNOSIS — E782 Mixed hyperlipidemia: Secondary | ICD-10-CM | POA: Diagnosis not present

## 2024-03-18 DIAGNOSIS — F329 Major depressive disorder, single episode, unspecified: Secondary | ICD-10-CM | POA: Diagnosis not present

## 2024-03-18 DIAGNOSIS — J302 Other seasonal allergic rhinitis: Secondary | ICD-10-CM | POA: Diagnosis not present

## 2024-03-18 DIAGNOSIS — M545 Low back pain, unspecified: Secondary | ICD-10-CM | POA: Diagnosis not present

## 2024-03-18 DIAGNOSIS — R800 Isolated proteinuria: Secondary | ICD-10-CM | POA: Diagnosis not present

## 2024-03-18 DIAGNOSIS — N1832 Chronic kidney disease, stage 3b: Secondary | ICD-10-CM | POA: Diagnosis not present

## 2024-03-23 ENCOUNTER — Other Ambulatory Visit (HOSPITAL_COMMUNITY): Payer: Self-pay | Admitting: Nurse Practitioner

## 2024-03-23 DIAGNOSIS — Z780 Asymptomatic menopausal state: Secondary | ICD-10-CM

## 2024-04-06 DIAGNOSIS — H31091 Other chorioretinal scars, right eye: Secondary | ICD-10-CM | POA: Diagnosis not present

## 2024-04-06 DIAGNOSIS — H353213 Exudative age-related macular degeneration, right eye, with inactive scar: Secondary | ICD-10-CM | POA: Diagnosis not present

## 2024-04-06 DIAGNOSIS — H43813 Vitreous degeneration, bilateral: Secondary | ICD-10-CM | POA: Diagnosis not present

## 2024-04-06 DIAGNOSIS — H353123 Nonexudative age-related macular degeneration, left eye, advanced atrophic without subfoveal involvement: Secondary | ICD-10-CM | POA: Diagnosis not present

## 2024-04-20 DIAGNOSIS — J329 Chronic sinusitis, unspecified: Secondary | ICD-10-CM | POA: Diagnosis not present

## 2024-04-20 DIAGNOSIS — Z20822 Contact with and (suspected) exposure to covid-19: Secondary | ICD-10-CM | POA: Diagnosis not present

## 2024-06-08 DIAGNOSIS — L570 Actinic keratosis: Secondary | ICD-10-CM | POA: Diagnosis not present

## 2024-06-08 DIAGNOSIS — C44722 Squamous cell carcinoma of skin of right lower limb, including hip: Secondary | ICD-10-CM | POA: Diagnosis not present

## 2024-06-08 DIAGNOSIS — Z85828 Personal history of other malignant neoplasm of skin: Secondary | ICD-10-CM | POA: Diagnosis not present

## 2024-06-08 DIAGNOSIS — X32XXXD Exposure to sunlight, subsequent encounter: Secondary | ICD-10-CM | POA: Diagnosis not present

## 2024-06-08 DIAGNOSIS — Z08 Encounter for follow-up examination after completed treatment for malignant neoplasm: Secondary | ICD-10-CM | POA: Diagnosis not present

## 2024-06-09 DIAGNOSIS — H31091 Other chorioretinal scars, right eye: Secondary | ICD-10-CM | POA: Diagnosis not present

## 2024-06-09 DIAGNOSIS — H43393 Other vitreous opacities, bilateral: Secondary | ICD-10-CM | POA: Diagnosis not present

## 2024-06-09 DIAGNOSIS — H353213 Exudative age-related macular degeneration, right eye, with inactive scar: Secondary | ICD-10-CM | POA: Diagnosis not present

## 2024-06-09 DIAGNOSIS — H43813 Vitreous degeneration, bilateral: Secondary | ICD-10-CM | POA: Diagnosis not present

## 2024-06-09 DIAGNOSIS — H353123 Nonexudative age-related macular degeneration, left eye, advanced atrophic without subfoveal involvement: Secondary | ICD-10-CM | POA: Diagnosis not present

## 2024-06-18 DIAGNOSIS — R7301 Impaired fasting glucose: Secondary | ICD-10-CM | POA: Diagnosis not present

## 2024-06-18 DIAGNOSIS — E782 Mixed hyperlipidemia: Secondary | ICD-10-CM | POA: Diagnosis not present

## 2024-06-18 DIAGNOSIS — E039 Hypothyroidism, unspecified: Secondary | ICD-10-CM | POA: Diagnosis not present

## 2024-06-25 DIAGNOSIS — M545 Low back pain, unspecified: Secondary | ICD-10-CM | POA: Diagnosis not present

## 2024-06-25 DIAGNOSIS — R778 Other specified abnormalities of plasma proteins: Secondary | ICD-10-CM | POA: Diagnosis not present

## 2024-06-25 DIAGNOSIS — K219 Gastro-esophageal reflux disease without esophagitis: Secondary | ICD-10-CM | POA: Diagnosis not present

## 2024-06-25 DIAGNOSIS — J302 Other seasonal allergic rhinitis: Secondary | ICD-10-CM | POA: Diagnosis not present

## 2024-06-25 DIAGNOSIS — Z0001 Encounter for general adult medical examination with abnormal findings: Secondary | ICD-10-CM | POA: Diagnosis not present

## 2024-06-25 DIAGNOSIS — F329 Major depressive disorder, single episode, unspecified: Secondary | ICD-10-CM | POA: Diagnosis not present

## 2024-06-25 DIAGNOSIS — E039 Hypothyroidism, unspecified: Secondary | ICD-10-CM | POA: Diagnosis not present

## 2024-06-25 DIAGNOSIS — N1832 Chronic kidney disease, stage 3b: Secondary | ICD-10-CM | POA: Diagnosis not present

## 2024-06-25 DIAGNOSIS — R800 Isolated proteinuria: Secondary | ICD-10-CM | POA: Diagnosis not present

## 2024-06-25 DIAGNOSIS — F5104 Psychophysiologic insomnia: Secondary | ICD-10-CM | POA: Diagnosis not present

## 2024-06-25 DIAGNOSIS — E782 Mixed hyperlipidemia: Secondary | ICD-10-CM | POA: Diagnosis not present

## 2024-06-30 ENCOUNTER — Encounter (INDEPENDENT_AMBULATORY_CARE_PROVIDER_SITE_OTHER): Payer: Self-pay | Admitting: *Deleted

## 2024-07-23 DIAGNOSIS — Z1211 Encounter for screening for malignant neoplasm of colon: Secondary | ICD-10-CM | POA: Diagnosis not present

## 2024-07-23 DIAGNOSIS — F329 Major depressive disorder, single episode, unspecified: Secondary | ICD-10-CM | POA: Diagnosis not present

## 2024-08-04 DIAGNOSIS — H43813 Vitreous degeneration, bilateral: Secondary | ICD-10-CM | POA: Diagnosis not present

## 2024-08-04 DIAGNOSIS — D239 Other benign neoplasm of skin, unspecified: Secondary | ICD-10-CM | POA: Diagnosis not present

## 2024-08-04 DIAGNOSIS — H353123 Nonexudative age-related macular degeneration, left eye, advanced atrophic without subfoveal involvement: Secondary | ICD-10-CM | POA: Diagnosis not present

## 2024-08-04 DIAGNOSIS — H31091 Other chorioretinal scars, right eye: Secondary | ICD-10-CM | POA: Diagnosis not present

## 2024-08-04 DIAGNOSIS — H353213 Exudative age-related macular degeneration, right eye, with inactive scar: Secondary | ICD-10-CM | POA: Diagnosis not present

## 2024-08-04 DIAGNOSIS — H43393 Other vitreous opacities, bilateral: Secondary | ICD-10-CM | POA: Diagnosis not present

## 2024-08-10 DIAGNOSIS — R739 Hyperglycemia, unspecified: Secondary | ICD-10-CM | POA: Diagnosis not present

## 2024-08-10 DIAGNOSIS — N1831 Chronic kidney disease, stage 3a: Secondary | ICD-10-CM | POA: Diagnosis not present

## 2024-08-13 ENCOUNTER — Encounter (INDEPENDENT_AMBULATORY_CARE_PROVIDER_SITE_OTHER): Payer: Self-pay | Admitting: *Deleted

## 2024-08-17 DIAGNOSIS — E875 Hyperkalemia: Secondary | ICD-10-CM | POA: Diagnosis not present

## 2024-08-17 DIAGNOSIS — D631 Anemia in chronic kidney disease: Secondary | ICD-10-CM | POA: Diagnosis not present

## 2024-08-17 DIAGNOSIS — N2581 Secondary hyperparathyroidism of renal origin: Secondary | ICD-10-CM | POA: Diagnosis not present

## 2024-08-17 DIAGNOSIS — R809 Proteinuria, unspecified: Secondary | ICD-10-CM | POA: Diagnosis not present

## 2024-08-17 DIAGNOSIS — N1832 Chronic kidney disease, stage 3b: Secondary | ICD-10-CM | POA: Diagnosis not present

## 2024-08-24 ENCOUNTER — Other Ambulatory Visit (HOSPITAL_COMMUNITY): Payer: Self-pay | Admitting: Nephrology

## 2024-08-24 DIAGNOSIS — R809 Proteinuria, unspecified: Secondary | ICD-10-CM

## 2024-09-01 DIAGNOSIS — Z23 Encounter for immunization: Secondary | ICD-10-CM | POA: Diagnosis not present

## 2024-09-03 ENCOUNTER — Encounter (INDEPENDENT_AMBULATORY_CARE_PROVIDER_SITE_OTHER): Payer: Self-pay | Admitting: Gastroenterology

## 2024-09-03 ENCOUNTER — Telehealth (INDEPENDENT_AMBULATORY_CARE_PROVIDER_SITE_OTHER): Payer: Self-pay

## 2024-09-03 ENCOUNTER — Ambulatory Visit (INDEPENDENT_AMBULATORY_CARE_PROVIDER_SITE_OTHER): Admitting: Gastroenterology

## 2024-09-03 VITALS — BP 115/62 | HR 78 | Temp 98.4°F | Ht 63.0 in | Wt 130.2 lb

## 2024-09-03 DIAGNOSIS — K219 Gastro-esophageal reflux disease without esophagitis: Secondary | ICD-10-CM

## 2024-09-03 DIAGNOSIS — R195 Other fecal abnormalities: Secondary | ICD-10-CM | POA: Diagnosis not present

## 2024-09-03 MED ORDER — FAMOTIDINE 40 MG PO TABS: 40.0000 mg | ORAL_TABLET | Freq: Two times a day (BID) | ORAL | 3 refills | Status: AC

## 2024-09-03 NOTE — Progress Notes (Signed)
 Toribio Fortune, M.D. Gastroenterology & Hepatology [facility] Gastroenter[facility] [address] [place], [place] [address] Primary Care Physician: Shona Norleen PEDLAR, MD [address] [place] [address]  Referring MD: Rosaline Macadam, FNP  Chief Complaint:  positive fecal occult blood test   History of Present Illness: [name] is a 83 y.o. female with PMH anxiety, depression, FBM, GERD, hypothyroidism, macular degeneration, who presents for evaluation of positive fecal occult blood test.  It appears that the patient had 3 positive stool cards for fecal occult blood, although I do not have access to these records.  Patient denies any complaints. The patient denies having any nausea, vomiting, fever, chills, hematochezia, melena, hematemesis, abdominal distention, abdominal pain, diarrhea, jaundice, pruritus or weight loss.  States she has had a chronic history of GERD for 40 years. She reports that she may have heartburn, especially if she eats after 6 PM or if she eats spicy foods/heavy meals. She has heartburn once a week. No odynophagia or dysphagia. She takes Pepcid  OTC twice a day. She was on PPI before but due to CKD she was started on Pepcid .  Last ZHI:7981 - Normal esophagus. - Z- line regular, 38 cm from the incisors. - Gastric antral vascular ectasia without bleeding. Biopsied. - Normal duodenal bulb and second portion of the duodenum.  Last Colonoscopy:2016 Small external hemorrhoids otherwise normal colonoscopy.   FHx: sister had some liver lesion unknown type, neg for any gastrointestinal/liver disease, father had lymphosarcoma Social: neg smoking, alcohol or illicit drug use Surgical: small intestinal mass, appendectomy, cholecystectomy  Past Medical History: Past Medical History:  Diagnosis Date   Allergy     Anxiety    Arthritis    Cataract    Chronic back pain    Chronic kidney disease    Chronic sinusitis    Depression     Fibromyalgia    GERD (gastroesophageal reflux disease)    Glaucoma    Hypothyroidism    Macular degeneration    Renal insufficiency    Skin cancer     Past Surgical History: Past Surgical History:  Procedure Laterality Date   ABDOMINAL HYSTERECTOMY     1985   ABDOMINAL HYSTERECTOMY     APPENDECTOMY     back stimulator removed  11/15/2016   back stimulatory     BACK SURGERY     plate and screws   BACK SURGERY     plate in back   CATARACT EXTRACTION W/PHACO Right 03/22/2016   Procedure: CATARACT EXTRACTION PHACO AND INTRAOCULAR LENS PLACEMENT (IOC);  Surgeon: Cherene Mania, MD;  [facility];  Service: Ophthalmology;  Laterality: Right;  CDE 5.69   CATARACT EXTRACTION W/PHACO Left 04/19/2016   Procedure: CATARACT EXTRACTION PHACO AND INTRAOCULAR LENS PLACEMENT (IOC);  Surgeon: Cherene Mania, MD;  [facility]: [facility];  Service: Ophthalmology;  Laterality: Left;  CDE 5.05   CHOLECYSTECTOMY     2014, non-functioning GB   COLONOSCOPY N/A 11/11/2015   Procedure: COLONOSCOPY;  Surgeon: Claudis RAYMOND Rivet, MD;  [facility];  Service: Endoscopy;  Laterality: N/A;  1:30   COSMETIC SURGERY     ESOPHAGOGASTRODUODENOSCOPY N/A 12/19/2016   Procedure: ESOPHAGOGASTRODUODENOSCOPY (EGD);  Surgeon: Claudis RAYMOND Rivet, MD;  [facility];  Service: Endoscopy;  Laterality: N/A;  2:25   EYE SURGERY     LAPAROSCOPIC APPENDECTOMY N/A 10/30/2017   Procedure: APPENDECTOMY LAPAROSCOPIC;  Surgeon: Curvin Deward MOULD, MD;  [facility];  Service: General;  Laterality: N/A;  LAPAROSCOPIC REMOVAL OF MESENTERIC MASS N/A 10/30/2017   Procedure: LAPAROSCOPIC RESECTION OF MASS SMALL BOWEL MESENTRY;  Surgeon: Curvin Deward MOULD, MD;  [facility];  Service: General;  Laterality: N/A;   LAPAROSCOPIC SMALL BOWEL RESECTION N/A 10/30/2017   Procedure: LAPAROSCOPIC ASSISTED SMALL BOWEL RESECTION;  Surgeon: Curvin Deward MOULD, MD;  Location: [facility];  Service: General;  Laterality: N/A;   SMALL INTESTINE SURGERY      TONSILLECTOMY      Family History: Family History  Problem Relation Age of Onset   Allergies Mother    Heart disease Mother    Hyperlipidemia Mother    Allergies Daughter    Hodgkin's lymphoma Father    Liver cancer Sister        rare liver cancer    Social History: Social History   Tobacco Use  Smoking Status Never  Smokeless Tobacco Never   Social History   Substance and Sexual Activity  Alcohol Use Yes   Alcohol/week: 0.0 standard drinks of alcohol   Comment: rare   Social History   Substance and Sexual Activity  Drug Use No    Allergies: Allergies  Allergen Reactions   Doxycycline  Rash    Rash on hands    Medications: Current Outpatient Medications  Medication Sig Dispense Refill   atorvastatin (LIPITOR) 10 MG tablet Take 10 mg by mouth daily.     Azelastine -Fluticasone  137-50 MCG/ACT SUSP Place 2 sprays into both nostrils daily as needed (for allergies).     cetirizine (ZYRTEC) 10 MG tablet Take 1 tablet by mouth at bedtime.     dapagliflozin propanediol (FARXIGA) 5 MG TABS tablet Take 5 mg by mouth daily.     estradiol (CLIMARA - DOSED IN MG/24 HR) 0.025 mg/24hr patch Place 0.025 mg onto the skin every Monday.   6   famotidine  (PEPCID ) 20 MG tablet Take 20 mg by mouth 2 (two) times daily.     FLUoxetine (PROZAC) 20 MG tablet Take 20 mg by mouth daily.     HYDROcodone-acetaminophen  (NORCO/VICODIN) 5-325 MG per tablet Take 1 tablet by mouth 4 (four) times daily as needed for moderate pain.      levothyroxine  (SYNTHROID ) 75 MCG tablet Take 1 tablet (75 mcg total) by mouth daily. 90 tablet 3   metoprolol  tartrate (LOPRESSOR ) 50 MG tablet Take 1 tablet (50 mg total) by mouth once for 1 dose. 1 tablet 0   OVER THE COUNTER MEDICATION Take 2 capsules by mouth daily. Premiere Formula Ocular Nutrition (Presivision)     No current facility-administered medications for this visit.    Review of Systems: GENERAL: negative for malaise, night sweats HEENT: No  changes in hearing or vision, no nose bleeds or other nasal problems. NECK: Negative for lumps, goiter, pain and significant neck swelling RESPIRATORY: Negative for cough, wheezing CARDIOVASCULAR: Negative for chest pain, leg swelling, palpitations, orthopnea GI: SEE HPI MUSCULOSKELETAL: Negative for joint pain or swelling, back pain, and muscle pain. SKIN: Negative for lesions, rash PSYCH: Negative for sleep disturbance, mood disorder and recent psychosocial stressors. HEMATOLOGY Negative for prolonged bleeding, bruising easily, and swollen nodes. ENDOCRINE: Negative for cold or heat intolerance, polyuria, polydipsia and goiter. NEURO: negative for tremor, gait imbalance, syncope and seizures. The remainder of the review of systems is noncontributory.   Physical Exam: BP 115/62 (BP Location: Left Arm, Patient Position: Sitting, Cuff Size: Normal)   Pulse 78   Temp 98.4 F (36.9 C) (Temporal)   Ht 5' 3 (1.6 m)   Wt 130  lb 3.2 oz (59.1 kg)   BMI 23.06 kg/m  GENERAL: The patient is AO x3, in no acute distress. HEENT: Head is normocephalic and atraumatic. EOMI are intact. Mouth is well hydrated and without lesions. NECK: Supple. No masses LUNGS: Clear to auscultation. No presence of rhonchi/wheezing/rales. Adequate chest expansion HEART: RRR, normal s1 and s2. ABDOMEN: Soft, nontender, no guarding, no peritoneal signs, and nondistended. BS +. No masses. EXTREMITIES: Without any cyanosis, clubbing, rash, lesions or edema. NEUROLOGIC: AOx3, no focal motor deficit. SKIN: no jaundice, no rashes   Imaging/Labs: as above  I personally reviewed and interpreted the available labs, imaging and endoscopic files.  Impression and Plan: [name] is a 83 y.o. female with PMH anxiety, depression, FBM, GERD, hypothyroidism, macular degeneration, who presents for evaluation of positive fecal occult blood test. Patient was found to have positive FOBT but has not presented any overt  gastrointestinal bleeding.  Denies any complaints at the moment.  She had a normal colonoscopy in 2016.  We will evaluate her positive testing with colonoscopy at this point, which she is agreeable to proceed with.  She has presented intermittent episodes of heartburn, related to the type of food that she eats and timing of eating her last meal.  Has had partial relief of symptoms with low-dose famotidine  twice a day.  As she had worsening kidney function with PPI, we will increase her famotidine  to 40 mg twice a day.  -Schedule  colonoscopy -Increase famotidine  to 40 mg twice a day  All questions were answered.      Toribio Fortune, MD Gastroenterology and Hepatology [facility]

## 2024-09-03 NOTE — Telephone Encounter (Signed)
 ATC pt to schedule colonoscopy, no answer. VM not set up yet.

## 2024-09-03 NOTE — Patient Instructions (Addendum)
 Schedule  colonoscopy Increase famotidine  to 40 mg twice a day

## 2024-09-09 ENCOUNTER — Encounter (INDEPENDENT_AMBULATORY_CARE_PROVIDER_SITE_OTHER): Payer: Self-pay | Admitting: Gastroenterology

## 2024-09-10 ENCOUNTER — Other Ambulatory Visit (HOSPITAL_COMMUNITY): Payer: Self-pay | Admitting: Nephrology

## 2024-09-10 DIAGNOSIS — N1832 Chronic kidney disease, stage 3b: Secondary | ICD-10-CM

## 2024-09-15 ENCOUNTER — Ambulatory Visit (HOSPITAL_COMMUNITY)
Admission: RE | Admit: 2024-09-15 | Discharge: 2024-09-15 | Disposition: A | Discharge status: Home / Self Care | Source: Ambulatory Visit | Attending: Nephrology | Admitting: Nephrology

## 2024-09-15 DIAGNOSIS — N1832 Chronic kidney disease, stage 3b: Secondary | ICD-10-CM | POA: Diagnosis not present

## 2024-09-17 ENCOUNTER — Encounter: Payer: Self-pay | Admitting: Radiology

## 2024-09-17 NOTE — Progress Notes (Signed)
 [name] LABOR, MD  [name] PROCEDURE / BIOPSY REVIEW Date: 09/17/24  Requested Biopsy site: kidney Reason for request: proteinuria Imaging review: Best seen on US   Decision: Approved Imaging modality to perform: Ultrasound Schedule with: Moderate Sedation Schedule for: Any VIR  Additional comments: @Schedulers .  Please contact me with questions, concerns, or if issue pertaining to this request arise.  Yang LABOR Jenna, MD [facility] Radiology       Previous Messages    ----- Message ----- From: Brittany Rosella CROME Sent: 09/17/2024   8:08 AM EDT To: Rosella CROME Brittany; Dmarius Reeder F Lucinda Spells, RT; Ir Proc* Subject: US  BIOPSY (KIDNEY)                            Procedure :US  BIOPSY (KIDNEY)  Reason :Continues to worsen despite SGLT2 inhibitory therapy Dx: Proteinuria, unspecified type [R80.9 (ICD-10-CM)]    History :US  RENAL  Provider: Dennise Hoes, MD  Provider contact ;  [phone]

## 2024-09-24 MED ORDER — PEG 3350-KCL-NA BICARB-NACL 420 G PO SOLR: 4000.0000 mL | Freq: Once | ORAL | 0 refills | Status: AC

## 2024-09-24 NOTE — Addendum Note (Signed)
 Addended by: DALLIE LIONEL RAMAN on: 09/24/2024 10:47 AM   Modules accepted: Orders

## 2024-09-24 NOTE — Telephone Encounter (Signed)
 PA on [facility] for TCS: This member's plan does not currently require notification or prior-authorization through the [employer] Notification or Prior-Authorization Program.

## 2024-09-29 DIAGNOSIS — H43813 Vitreous degeneration, bilateral: Secondary | ICD-10-CM | POA: Diagnosis not present

## 2024-09-29 DIAGNOSIS — H353134 Nonexudative age-related macular degeneration, bilateral, advanced atrophic with subfoveal involvement: Secondary | ICD-10-CM | POA: Diagnosis not present

## 2024-09-29 DIAGNOSIS — H31091 Other chorioretinal scars, right eye: Secondary | ICD-10-CM | POA: Diagnosis not present

## 2024-09-29 DIAGNOSIS — H43393 Other vitreous opacities, bilateral: Secondary | ICD-10-CM | POA: Diagnosis not present

## 2024-09-29 DIAGNOSIS — H353213 Exudative age-related macular degeneration, right eye, with inactive scar: Secondary | ICD-10-CM | POA: Diagnosis not present

## 2024-10-14 ENCOUNTER — Other Ambulatory Visit: Payer: Self-pay | Admitting: Interventional Radiology

## 2024-10-14 DIAGNOSIS — Z01818 Encounter for other preprocedural examination: Secondary | ICD-10-CM

## 2024-10-14 NOTE — H&P (Shared)
 Chief Complaint: Patient was seen in consultation today for proteinuria, with consideration for random renal biopsy.  Referring Provider(s): Dr. Ephriam Stank, MD   Supervising Physician: Luverne Aran  Patient Status: [facility] - Out-pt  Patient is Full Code  History of Present Illness: [name] is a 83 y.o. female  with PMHx notable for proteinuria, CKD stage III, hypothyroidism, arthritis, anxiety, GERD, fibromyalgia, glaucoma, macular degeneration, and others as delineated below  Per review of external chart, patient is followed by Dr. Stank at [facility] in [place].  Patient recently experienced an increase in proteinuria of unknown etiology, despite SGLT2 inhibitor therapy.  She is recommended for renal biopsy for further evaluation.  Interventional Radiology was requested for random renal biopsy. Patient is scheduled for same in IR today.   ***Patient is alert and laying in bed, calm.  Patient is currently without any significant complaints.  Patient denies any fevers, headache, chest pain, SOB, cough, abdominal pain, nausea, vomiting or bleeding.     Past Medical History:  Diagnosis Date   Allergy     Anxiety    Arthritis    Cataract    Chronic back pain    Chronic kidney disease    Chronic sinusitis    Depression    Fibromyalgia    GERD (gastroesophageal reflux disease)    Glaucoma    Hypothyroidism    Macular degeneration    Renal insufficiency    Skin cancer     Past Surgical History:  Procedure Laterality Date   ABDOMINAL HYSTERECTOMY     1985   ABDOMINAL HYSTERECTOMY     APPENDECTOMY     back stimulator removed  11/15/2016   back stimulatory     BACK SURGERY     plate and screws   BACK SURGERY     plate in back   CATARACT EXTRACTION W/PHACO Right 03/22/2016   Procedure: CATARACT EXTRACTION PHACO AND INTRAOCULAR LENS PLACEMENT (IOC);  Surgeon: Cherene Mania, MD;  Location: [facility];  Service: Ophthalmology;  Laterality: Right;  CDE 5.69    CATARACT EXTRACTION W/PHACO Left 04/19/2016   Procedure: CATARACT EXTRACTION PHACO AND INTRAOCULAR LENS PLACEMENT (IOC);  Surgeon: Cherene Mania, MD;  Location: [facility];  Service: Ophthalmology;  Laterality: Left;  CDE 5.05   CHOLECYSTECTOMY     2014, non-functioning GB   COLONOSCOPY N/A 11/11/2015   Procedure: COLONOSCOPY;  Surgeon: Claudis RAYMOND Rivet, MD;  Location: [facility];  Service: Endoscopy;  Laterality: N/A;  1:30   COSMETIC SURGERY     ESOPHAGOGASTRODUODENOSCOPY N/A 12/19/2016   Procedure: ESOPHAGOGASTRODUODENOSCOPY (EGD);  Surgeon: Claudis RAYMOND Rivet, MD;  Location: [facility];  Service: Endoscopy;  Laterality: N/A;  2:25   EYE SURGERY     LAPAROSCOPIC APPENDECTOMY N/A 10/30/2017   Procedure: APPENDECTOMY LAPAROSCOPIC;  Surgeon: Curvin Deward MOULD, MD;  Location: [facility];  Service: General;  Laterality: N/A;   LAPAROSCOPIC REMOVAL OF MESENTERIC MASS N/A 10/30/2017   Procedure: LAPAROSCOPIC RESECTION OF MASS SMALL BOWEL MESENTRY;  Surgeon: Curvin Deward MOULD, MD;  Location: [facility] OR;  Service: General;  Laterality: N/A;   LAPAROSCOPIC SMALL BOWEL RESECTION N/A 10/30/2017   Procedure: LAPAROSCOPIC ASSISTED SMALL BOWEL RESECTION;  Surgeon: Curvin Deward MOULD, MD;  Location: [facility] OR;  Service: General;  Laterality: N/A;   SMALL INTESTINE SURGERY     TONSILLECTOMY      Allergies: Doxycycline   Medications: Prior to Admission medications   Medication Sig Start Date End Date Taking? Authorizing Provider  atorvastatin (LIPITOR)  10 MG tablet Take 10 mg by mouth daily.    [provider]  Azelastine -Fluticasone  137-50 MCG/ACT SUSP Place 2 sprays into both nostrils daily as needed (for allergies).    [provider]  cetirizine (ZYRTEC) 10 MG tablet Take 1 tablet by mouth at bedtime.    [provider]  dapagliflozin propanediol (FARXIGA) 5 MG TABS tablet Take 5 mg by mouth daily.    [provider]  estradiol (CLIMARA - DOSED IN MG/24 HR) 0.025 mg/24hr patch Place 0.025 mg onto  the skin every Monday.  11/28/16   [provider]  famotidine  (PEPCID ) 40 MG tablet Take 1 tablet (40 mg total) by mouth 2 (two) times daily. 09/03/24   Eartha Angelia Sieving, MD  FLUoxetine (PROZAC) 20 MG tablet Take 20 mg by mouth daily.    [provider]  HYDROcodone-acetaminophen  (NORCO/VICODIN) 5-325 MG per tablet Take 1 tablet by mouth 4 (four) times daily as needed for moderate pain.     [provider]  levothyroxine  (SYNTHROID ) 75 MCG tablet Take 1 tablet (75 mcg total) by mouth daily. 12/03/19   Corum, Olam CROME, MD  metoprolol  tartrate (LOPRESSOR ) 50 MG tablet Take 1 tablet (50 mg total) by mouth once for 1 dose. 01/05/21 09/03/24  Richarda Prentice CROME Mickey., NP  OVER THE COUNTER MEDICATION Take 2 capsules by mouth daily. Premiere Formula Ocular Nutrition (Presivision)    [provider]     Family History  Problem Relation Age of Onset   Allergies Mother    Heart disease Mother    Hyperlipidemia Mother    Allergies Daughter    Hodgkin's lymphoma Father    Liver cancer Sister        rare liver cancer    Social History   Socioeconomic History   Marital status: Married    Spouse name: Not on file   Number of children: Not on file   Years of education: Not on file   Highest education level: Not on file  Occupational History   Occupation: Retired   Tobacco Use   Smoking status: Never   Smokeless tobacco: Never  Vaping Use   Vaping status: Never Used  Substance and Sexual Activity   Alcohol use: Yes    Alcohol/week: 0.0 standard drinks of alcohol    Comment: rare   Drug use: No   Sexual activity: Not on file  Other Topics Concern   Not on file  Social History Narrative   Not on file   Social Drivers of Health   Financial Resource Strain: Not on file  Food Insecurity: Not on file  Transportation Needs: Not on file  Physical Activity: Not on file  Stress: Not on file  Social Connections: Not on file     Review of Systems: A 12  point ROS discussed and pertinent positives are indicated in the HPI above.  All other systems are negative.  Vital Signs: There were no vitals taken for this visit.  Advance Care Plan: The advanced care place/surrogate decision maker was discussed at the time of visit and the patient did not wish to discuss or was not able to name a surrogate decision maker or provide an advance care plan.  Physical Exam  Imaging: US  RENAL Result Date: 09/16/2024 CLINICAL DATA:  Chronic kidney disease stage 3 B EXAM: RENAL / URINARY TRACT ULTRASOUND COMPLETE COMPARISON:  Renal ultrasound June 19, 2021. FINDINGS: Right Kidney: Renal measurements: 10.6 x 3.5 x 4.6 cm = volume: 88 mL.  Echogenicity is increased. Cortical thinning. No nephrolithiasis, mass or hydronephrosis visualized. Left Kidney: Renal measurements: 10.7 x 4.1 x 4.2 cm = volume: 95 mL. Echogenicity is increased. Cortical thinning. No nephrolithiasis hydronephrosis, mass or hydronephrosis visualized. Bladder: Appears normal for degree of bladder distention. Bilateral jets are not visualized during the time of exam. Other: None. IMPRESSION: Increased bilateral renal echogenicity which can be seen the setting of chronic medical renal disease. No renal mass. Correlate with renal function tests. Electronically Signed   By: Megan  Zare M.D.   On: 09/16/2024 17:36    Labs:  CBC: No results for input(s): WBC, HGB, HCT, PLT in the last 8760 hours.  COAGS: No results for input(s): INR, APTT in the last 8760 hours.  BMP: No results for input(s): NA, K, CL, CO2, GLUCOSE, BUN, CALCIUM, CREATININE, GFRNONAA, GFRAA in the last 8760 hours.  Invalid input(s): CMP  LIVER FUNCTION TESTS: No results for input(s): BILITOT, AST, ALT, ALKPHOS, PROT, ALBUMIN  in the last 8760 hours.  TUMOR MARKERS: No results for input(s): AFPTM, CEA, CA199, CHROMGRNA in the last 8760 hours.  Assessment and Plan: Per  review of external chart, patient is followed by Dr. Dennise at [facility] in [place].  Patient recently experienced an increase in proteinuria of unknown etiology, despite SGLT2 inhibitor therapy.  She is recommended for renal biopsy for further evaluation.  Patient presents for scheduled random renal biopsy in IR today.  ***Patient has been NPO since midnight.  All labs and medications are within acceptable parameters.  No pertinent allergies.   Risks and benefits of renal biopsy was discussed with the patient and/or patient's family including, but not limited to bleeding, infection, damage to adjacent structures or low yield requiring additional tests.  All of the questions were answered and there is agreement to proceed.  Consent signed and in chart.     Thank you for allowing our service to participate in KASSADEE CARAWAN 's care.  Electronically Signed: Carlin DELENA Griffon, PA-C   10/14/2024, 11:17 AM      I spent a total of {New Out-Pt:[id number]} in face to face in clinical consultation, greater than 50% of which was counseling/coordinating care for proteinuria, with consideration for random renal biopsy.

## 2024-10-15 ENCOUNTER — Encounter (HOSPITAL_COMMUNITY): Payer: Self-pay

## 2024-10-15 ENCOUNTER — Ambulatory Visit (HOSPITAL_COMMUNITY)
Admission: RE | Admit: 2024-10-15 | Discharge: 2024-10-15 | Disposition: A | Discharge status: Home / Self Care | Source: Ambulatory Visit | Attending: Nephrology | Admitting: Nephrology

## 2024-10-15 DIAGNOSIS — R809 Proteinuria, unspecified: Secondary | ICD-10-CM

## 2024-10-15 DIAGNOSIS — Z01818 Encounter for other preprocedural examination: Secondary | ICD-10-CM

## 2024-10-15 MED ORDER — SODIUM CHLORIDE 0.9 % IV SOLN: INTRAVENOUS | Status: DC

## 2024-10-21 ENCOUNTER — Encounter (HOSPITAL_COMMUNITY): Payer: Self-pay

## 2024-10-21 ENCOUNTER — Other Ambulatory Visit: Payer: Self-pay

## 2024-10-21 ENCOUNTER — Encounter (HOSPITAL_COMMUNITY)
Admission: RE | Admit: 2024-10-21 | Discharge: 2024-10-21 | Disposition: A | Discharge status: Home / Self Care | Source: Ambulatory Visit | Attending: Gastroenterology | Admitting: Gastroenterology

## 2024-10-27 ENCOUNTER — Encounter (HOSPITAL_COMMUNITY): Admission: RE | Payer: Self-pay | Source: Home / Self Care

## 2024-10-27 ENCOUNTER — Encounter (HOSPITAL_COMMUNITY): Payer: Self-pay | Admitting: Anesthesiology

## 2024-10-27 ENCOUNTER — Ambulatory Visit (HOSPITAL_COMMUNITY): Admission: RE | Admit: 2024-10-27 | Source: Home / Self Care | Admitting: Gastroenterology

## 2024-10-27 SURGERY — COLONOSCOPY
Anesthesia: Choice

## 2024-11-13 ENCOUNTER — Other Ambulatory Visit: Payer: Self-pay | Admitting: Student

## 2024-11-16 ENCOUNTER — Other Ambulatory Visit: Payer: Self-pay

## 2024-11-16 ENCOUNTER — Ambulatory Visit (HOSPITAL_COMMUNITY)
Admission: RE | Admit: 2024-11-16 | Discharge: 2024-11-16 | Disposition: A | Discharge status: Home / Self Care | Source: Ambulatory Visit | Attending: Nephrology | Admitting: Nephrology

## 2024-11-16 VITALS — BP 124/70 | HR 69 | Temp 97.5°F | Resp 11 | Ht 63.0 in | Wt 130.0 lb

## 2024-11-16 DIAGNOSIS — N269 Renal sclerosis, unspecified: Secondary | ICD-10-CM | POA: Diagnosis not present

## 2024-11-16 DIAGNOSIS — I4891 Unspecified atrial fibrillation: Secondary | ICD-10-CM | POA: Diagnosis not present

## 2024-11-16 DIAGNOSIS — E785 Hyperlipidemia, unspecified: Secondary | ICD-10-CM | POA: Insufficient documentation

## 2024-11-16 DIAGNOSIS — N19 Unspecified kidney failure: Secondary | ICD-10-CM | POA: Diagnosis present

## 2024-11-16 DIAGNOSIS — F32A Depression, unspecified: Secondary | ICD-10-CM | POA: Diagnosis not present

## 2024-11-16 DIAGNOSIS — E039 Hypothyroidism, unspecified: Secondary | ICD-10-CM | POA: Insufficient documentation

## 2024-11-16 DIAGNOSIS — R809 Proteinuria, unspecified: Secondary | ICD-10-CM | POA: Insufficient documentation

## 2024-11-16 DIAGNOSIS — I2510 Atherosclerotic heart disease of native coronary artery without angina pectoris: Secondary | ICD-10-CM | POA: Diagnosis not present

## 2024-11-16 DIAGNOSIS — Z79890 Hormone replacement therapy (postmenopausal): Secondary | ICD-10-CM | POA: Diagnosis not present

## 2024-11-16 DIAGNOSIS — Z79899 Other long term (current) drug therapy: Secondary | ICD-10-CM | POA: Insufficient documentation

## 2024-11-16 DIAGNOSIS — Z7984 Long term (current) use of oral hypoglycemic drugs: Secondary | ICD-10-CM | POA: Diagnosis not present

## 2024-11-16 DIAGNOSIS — N1831 Chronic kidney disease, stage 3a: Secondary | ICD-10-CM | POA: Insufficient documentation

## 2024-11-16 DIAGNOSIS — H409 Unspecified glaucoma: Secondary | ICD-10-CM | POA: Diagnosis not present

## 2024-11-16 DIAGNOSIS — F419 Anxiety disorder, unspecified: Secondary | ICD-10-CM | POA: Diagnosis not present

## 2024-11-16 LAB — PROTIME-INR
INR: 1 (ref 0.8–1.2)
Prothrombin Time: 14.1 s (ref 11.4–15.2)

## 2024-11-16 LAB — CBC
HCT: 38.5 % (ref 36.0–46.0)
Hemoglobin: 12.5 g/dL (ref 12.0–15.0)
MCH: 31.6 pg (ref 26.0–34.0)
MCHC: 32.5 g/dL (ref 30.0–36.0)
MCV: 97.5 fL (ref 80.0–100.0)
Platelets: 299 K/uL (ref 150–400)
RBC: 3.95 MIL/uL (ref 3.87–5.11)
RDW: 13 % (ref 11.5–15.5)
WBC: 6.5 K/uL (ref 4.0–10.5)
nRBC: 0 % (ref 0.0–0.2)

## 2024-11-16 MED ORDER — GELATIN ABSORBABLE 12-7 MM EX MISC
1.0000 | Freq: Once | CUTANEOUS | Status: AC
  Administered 2024-11-16: 1 via TOPICAL

## 2024-11-16 MED ORDER — LIDOCAINE HCL (PF) 1 % IJ SOLN
10.0000 mL | Freq: Once | INTRAMUSCULAR | Status: AC
  Administered 2024-11-16: 10 mL

## 2024-11-16 MED ORDER — SODIUM CHLORIDE 0.9 % IV SOLN: INTRAVENOUS | Status: DC

## 2024-11-16 MED ORDER — FENTANYL CITRATE (PF) 100 MCG/2ML IJ SOLN
INTRAMUSCULAR | Status: AC
  Filled 2024-11-16: qty 4

## 2024-11-16 MED ORDER — FENTANYL CITRATE (PF) 100 MCG/2ML IJ SOLN
INTRAMUSCULAR | Status: AC | PRN
  Administered 2024-11-16 (×2): 25 ug via INTRAVENOUS
  Administered 2024-11-16: 50 ug via INTRAVENOUS

## 2024-11-16 MED ORDER — MIDAZOLAM HCL (PF) 2 MG/2ML IJ SOLN
INTRAMUSCULAR | Status: AC | PRN
  Administered 2024-11-16 (×2): 1 mg via INTRAVENOUS

## 2024-11-16 MED ORDER — MIDAZOLAM HCL 2 MG/2ML IJ SOLN
INTRAMUSCULAR | Status: AC
  Filled 2024-11-16: qty 4

## 2024-11-16 NOTE — Procedures (Signed)
 Pre procedural Dx: Renal failure  Post procedural Dx: Same  Technically successful US  guided biopsy of left kidney   EBL: None.   Complications: None immediate.   KANDICE Banner, MD Pager #: [phone]

## 2024-11-16 NOTE — H&P (Signed)
 "   Chief Complaint: Worsening renal function  Referring Provider(s): Singh,Vikas  Supervising Physician: Jenna Hacker  Patient Status: [facility] - Out-pt  History of Present Illness: [name] is a 83 y.o. female with past medical history significant for hypothyroidism, glaucoma, HLD, afib (not on AC), lupus, CKD3a, CAD, skin CA 2022. She is followed by Dr. Dennise outpatient through [facility] for CKD. A renal biopsy has been requested for proteinuria, worsening renal function despite SGLT2 inhibitory therapy.  Confirms NPO since MN and ride/supervision available for 24 hours (husband).   Does not wear CPAP or use supplemental home O2.  Denies fever, chills, SOB, CP, sore throat, N/V, abd pain, blood in stool or urine, abnormal bruising, leg swelling, back pain.   Allergies Reviewed:  Doxycycline    Patient is Full Code  Past Medical History:  Diagnosis Date   Allergy     Anxiety    Arthritis    Cataract    Chronic back pain    Chronic kidney disease    Chronic sinusitis    Depression    Fibromyalgia    GERD (gastroesophageal reflux disease)    Glaucoma    Hypothyroidism    Macular degeneration    Renal insufficiency    Skin cancer     Past Surgical History:  Procedure Laterality Date   ABDOMINAL HYSTERECTOMY     1985   ABDOMINAL HYSTERECTOMY     APPENDECTOMY     back stimulator removed  11/15/2016   back stimulatory     BACK SURGERY     plate and screws   BACK SURGERY     plate in back   CATARACT EXTRACTION W/PHACO Right 03/22/2016   Procedure: CATARACT EXTRACTION PHACO AND INTRAOCULAR LENS PLACEMENT (IOC);  Surgeon: Cherene Mania, MD;  Location: [facility];  Service: Ophthalmology;  Laterality: Right;  CDE 5.69   CATARACT EXTRACTION W/PHACO Left 04/19/2016   Procedure: CATARACT EXTRACTION PHACO AND INTRAOCULAR LENS PLACEMENT (IOC);  Surgeon: Cherene Mania, MD;  Location: [facility];  Service: Ophthalmology;  Laterality: Left;  CDE 5.05   CHOLECYSTECTOMY      2014, non-functioning GB   COLONOSCOPY N/A 11/11/2015   Procedure: COLONOSCOPY;  Surgeon: Claudis RAYMOND Rivet, MD;  [facility];  Service: Endoscopy;  Laterality: N/A;  1:30   COSMETIC SURGERY     ESOPHAGOGASTRODUODENOSCOPY N/A 12/19/2016   Procedure: ESOPHAGOGASTRODUODENOSCOPY (EGD);  Surgeon: Claudis RAYMOND Rivet, MD;  [facility]: [facility];  Service: Endoscopy;  Laterality: N/A;  2:25   EYE SURGERY     LAPAROSCOPIC APPENDECTOMY N/A 10/30/2017   Procedure: APPENDECTOMY LAPAROSCOPIC;  Surgeon: Curvin Deward MOULD, MD;  Location: [facility];  Service: General;  Laterality: N/A;   LAPAROSCOPIC REMOVAL OF MESENTERIC MASS N/A 10/30/2017   Procedure: LAPAROSCOPIC RESECTION OF MASS SMALL BOWEL MESENTRY;  Surgeon: Curvin Deward MOULD, MD;  Location: [facility];  Service: General;  Laterality: N/A;   LAPAROSCOPIC SMALL BOWEL RESECTION N/A 10/30/2017   Procedure: LAPAROSCOPIC ASSISTED SMALL BOWEL RESECTION;  Surgeon: Curvin Deward MOULD, MD;  Location: [facility];  Service: General;  Laterality: N/A;   SMALL INTESTINE SURGERY     TONSILLECTOMY        Medications: Prior to Admission medications  Medication Sig Start Date End Date Taking? Authorizing Provider  atorvastatin (LIPITOR) 10 MG tablet Take 10 mg by mouth daily.    [provider]  Azelastine -Fluticasone  137-50 MCG/ACT SUSP Place 2 sprays into both nostrils daily as needed (for allergies).    [provider]  cetirizine (ZYRTEC) 10 MG tablet Take 1 tablet by mouth at bedtime.    [provider]  dapagliflozin propanediol (FARXIGA) 5 MG TABS tablet Take 5 mg by mouth daily.    [provider]  estradiol (CLIMARA - DOSED IN MG/24 HR) 0.025 mg/24hr patch Place 0.025 mg onto the skin every Monday.  11/28/16   [provider]  famotidine  (PEPCID ) 40 MG tablet Take 1 tablet (40 mg total) by mouth 2 (two) times daily. 09/03/24   Eartha Angelia Sieving, MD  FLUoxetine (PROZAC) 20 MG tablet Take 20 mg by mouth daily.    [provider]  HYDROcodone-acetaminophen  (NORCO/VICODIN) 5-325 MG per tablet Take 1 tablet by mouth 4 (four) times daily as needed for moderate pain.     [provider]  levothyroxine  (SYNTHROID ) 75 MCG tablet Take 1 tablet (75 mcg total) by mouth daily. 12/03/19   Corum, Olam CROME, MD  metoprolol  tartrate (LOPRESSOR ) 50 MG tablet Take 1 tablet (50 mg total) by mouth once for 1 dose. 01/05/21 09/03/24  Richarda Prentice CROME Mickey., NP  OVER THE COUNTER MEDICATION Take 2 capsules by mouth daily. Premiere Formula Ocular Nutrition (Presivision)    [provider]     Family History  Problem Relation Age of Onset   Allergies Mother    Heart disease Mother    Hyperlipidemia Mother    Allergies Daughter    Hodgkin's lymphoma Father    Liver cancer Sister        rare liver cancer    Social History   Socioeconomic History   Marital status: Married    Spouse name: Not on file   Number of children: Not on file   Years of education: Not on file   Highest education level: Not on file  Occupational History   Occupation: Retired   Tobacco Use   Smoking status: Never   Smokeless tobacco: Never  Vaping Use   Vaping status: Never Used  Substance and Sexual Activity   Alcohol use: Yes    Alcohol/week: 0.0 standard drinks of alcohol    Comment: rare   Drug use: No   Sexual activity: Not on file  Other Topics Concern   Not on file  Social History Narrative   Not on file   Social Drivers of Health   Tobacco Use: Low Risk (10/21/2024)   Patient History    Smoking Tobacco Use: Never    Smokeless Tobacco Use: Never    Passive Exposure: Not on file  Financial Resource Strain: Not on file  Food Insecurity: Not on file  Transportation Needs: Not on file  Physical Activity: Not on file  Stress: Not on file  Social Connections: Not on file  Depression (EYV7-0): Not on file  Alcohol Screen: Not on file  Housing: Not on file  Utilities: Not on file  Health Literacy: Not on file      Review of Systems: A 12 point ROS discussed and pertinent positives are indicated in the HPI above.  All other systems are negative.   Vital Signs: BP (!) 146/73   Pulse 60   Temp (!) 97.5 F (36.4 C) (Oral)   Resp 16   Ht 5' 3 (1.6 m)   Wt 130 lb (59 kg)   SpO2 100%   BMI 23.03 kg/m   No documents on file.    Physical Exam Constitutional:      General: She is not in acute distress.    Appearance: She is normal  weight.  HENT:     Mouth/Throat:     Mouth: Mucous membranes are moist.     Pharynx: Oropharynx is clear.  Cardiovascular:     Rate and Rhythm: Normal rate.     Pulses: Normal pulses.     Heart sounds: Normal heart sounds.  Pulmonary:     Effort: Pulmonary effort is normal.     Breath sounds: Normal breath sounds.  Abdominal:     General: There is no distension.     Palpations: Abdomen is soft.     Tenderness: There is no abdominal tenderness. There is no right CVA tenderness or left CVA tenderness.  Skin:    General: Skin is warm and dry.     Comments: No rash or lesion over planned puncture site  Neurological:     Mental Status: She is alert and oriented to person, place, and time.  Psychiatric:        Mood and Affect: Mood normal.        Behavior: Behavior normal.        Thought Content: Thought content normal.        Judgment: Judgment normal.     Imaging: No results found.  Labs:  CBC: No results for input(s): WBC, HGB, HCT, PLT in the last 8760 hours.  COAGS: No results for input(s): INR, APTT in the last 8760 hours.  BMP: No results for input(s): NA, K, CL, CO2, GLUCOSE, BUN, CALCIUM, CREATININE, GFRNONAA, GFRAA in the last 8760 hours.  Invalid input(s): CMP  LIVER FUNCTION TESTS: No results for input(s): BILITOT, AST, ALT, ALKPHOS, PROT, ALBUMIN  in the last 8760 hours.  TUMOR MARKERS: No results for input(s): AFPTM, CEA, CA199, CHROMGRNA in the last 8760  hours.  Assessment and Plan:  Request for  image guided renal biopsy approved by Dr. Jenna for 11/16/24. No contraindications for procedure identified in ROS, physical exam, or review of pre-sedation considerations. CBC and INR drawn same day, currently in process, will be reviewed prior to procedure start VSS, afebrile, BP <150/90, will repeat prior to procedure start  Patient does not take a blood thinner Abx not indicated    Risks and benefits of renal biopsy was discussed with the patient and/or patient's family including, but not limited to bleeding, infection, damage to adjacent structures or low yield requiring additional tests.  All of the questions were answered and there is agreement to proceed.  Consent signed and in chart.   Thank you for allowing our service to participate in KATRINNA TRAVIESO 's care.    Electronically Signed: Laymon Coast, NP   11/16/2024, 8:49 AM     I spent a total of  15 Minutes   in face to face in clinical consultation, greater than 50% of which was counseling/coordinating care for image guided renal biopsy.    (A copy of this note was sent to the referring provider and the time of visit.)  "

## 2024-11-16 NOTE — Progress Notes (Signed)
 Patient ambulated in the hallway without difficulty. No complaints of dizziness or shortness of breath. No bleeding or hematoma to left lower back, procedure site. Band-Aid clean, dry, and intact.

## 2024-11-23 ENCOUNTER — Encounter (HOSPITAL_COMMUNITY): Payer: Self-pay

## 2024-11-23 LAB — SURGICAL PATHOLOGY
